# Patient Record
Sex: Female | Born: 1950 | Race: White | Hispanic: No | Marital: Married | Smoking: Former smoker
Health system: Southern US, Community
[De-identification: ages and names within clinical notes are randomized; demographics above are authoritative.]

## PROBLEM LIST (undated history)

## (undated) DIAGNOSIS — H43812 Vitreous degeneration, left eye: Secondary | ICD-10-CM

## (undated) DIAGNOSIS — M1990 Unspecified osteoarthritis, unspecified site: Secondary | ICD-10-CM

## (undated) DIAGNOSIS — K219 Gastro-esophageal reflux disease without esophagitis: Secondary | ICD-10-CM

## (undated) DIAGNOSIS — F419 Anxiety disorder, unspecified: Secondary | ICD-10-CM

## (undated) DIAGNOSIS — J302 Other seasonal allergic rhinitis: Secondary | ICD-10-CM

## (undated) DIAGNOSIS — T753XXA Motion sickness, initial encounter: Secondary | ICD-10-CM

## (undated) DIAGNOSIS — Z9889 Other specified postprocedural states: Secondary | ICD-10-CM

## (undated) DIAGNOSIS — F329 Major depressive disorder, single episode, unspecified: Secondary | ICD-10-CM

## (undated) DIAGNOSIS — I10 Essential (primary) hypertension: Secondary | ICD-10-CM

## (undated) DIAGNOSIS — H269 Unspecified cataract: Secondary | ICD-10-CM

## (undated) DIAGNOSIS — R569 Unspecified convulsions: Secondary | ICD-10-CM

## (undated) DIAGNOSIS — R011 Cardiac murmur, unspecified: Secondary | ICD-10-CM

## (undated) DIAGNOSIS — E785 Hyperlipidemia, unspecified: Secondary | ICD-10-CM

## (undated) DIAGNOSIS — L719 Rosacea, unspecified: Secondary | ICD-10-CM

## (undated) DIAGNOSIS — F338 Other recurrent depressive disorders: Secondary | ICD-10-CM

## (undated) DIAGNOSIS — G43109 Migraine with aura, not intractable, without status migrainosus: Secondary | ICD-10-CM

## (undated) DIAGNOSIS — F32A Depression, unspecified: Secondary | ICD-10-CM

## (undated) DIAGNOSIS — N281 Cyst of kidney, acquired: Secondary | ICD-10-CM

## (undated) DIAGNOSIS — L570 Actinic keratosis: Secondary | ICD-10-CM

## (undated) DIAGNOSIS — R112 Nausea with vomiting, unspecified: Secondary | ICD-10-CM

## (undated) DIAGNOSIS — Z8489 Family history of other specified conditions: Secondary | ICD-10-CM

## (undated) DIAGNOSIS — K760 Fatty (change of) liver, not elsewhere classified: Secondary | ICD-10-CM

## (undated) HISTORY — PX: EYE SURGERY: SHX253

## (undated) HISTORY — PX: COSMETIC SURGERY: SHX468

## (undated) HISTORY — PX: MELANOMA EXCISION: SHX5266

## (undated) HISTORY — DX: Vitreous degeneration, left eye: H43.812

## (undated) HISTORY — DX: Depression, unspecified: F32.A

## (undated) HISTORY — DX: Migraine with aura, not intractable, without status migrainosus: G43.109

## (undated) HISTORY — DX: Rosacea, unspecified: L71.9

## (undated) HISTORY — DX: Anxiety disorder, unspecified: F41.9

## (undated) HISTORY — DX: Hyperlipidemia, unspecified: E78.5

## (undated) HISTORY — DX: Essential (primary) hypertension: I10

## (undated) HISTORY — DX: Major depressive disorder, single episode, unspecified: F32.9

## (undated) HISTORY — PX: OTHER SURGICAL HISTORY: SHX169

## (undated) HISTORY — DX: Unspecified cataract: H26.9

## (undated) HISTORY — PX: BASAL CELL CARCINOMA EXCISION: SHX1214

## (undated) HISTORY — DX: Actinic keratosis: L57.0

---

## 2011-04-13 DIAGNOSIS — H43812 Vitreous degeneration, left eye: Secondary | ICD-10-CM

## 2011-04-13 HISTORY — DX: Vitreous degeneration, left eye: H43.812

## 2013-10-13 HISTORY — PX: CHOLECYSTECTOMY: SHX55

## 2013-10-24 ENCOUNTER — Ambulatory Visit: Payer: Self-pay | Admitting: Family Medicine

## 2013-11-01 ENCOUNTER — Ambulatory Visit: Payer: Self-pay | Admitting: Surgery

## 2013-11-03 ENCOUNTER — Ambulatory Visit: Payer: Self-pay | Admitting: Surgery

## 2013-11-06 LAB — PATHOLOGY REPORT

## 2015-02-03 NOTE — Op Note (Signed)
PATIENT NAME:  TATYM, SCHERMER MR#:  [id number] DATE OF BIRTH:  23-Jul-1951  DATE OF PROCEDURE:  11/03/2013  PREOPERATIVE DIAGNOSIS: Chronic cholecystitis, cholelithiasis.   POSTOPERATIVE DIAGNOSIS: Chronic cholecystitis, cholelithiasis.   PROCEDURE: Laparoscopic cholecystectomy.   SURGEON: Rochel Brome, MD  ANESTHESIA: General.   INDICATIONS: This 64 year old female had recent epigastric pain, ultrasound findings of gallstones and surgery was recommended for definitive treatment. She did also incidentally have fatty liver and nodularity. MRI showed no evidence of cancer.  DESCRIPTION OF PROCEDURE: The patient was placed on the operating table in the supine position under general endotracheal anesthesia. The abdomen was prepared with ChloraPrep and draped in a sterile manner.  A short incision was made in the inferior aspect of the umbilicus and carried down to the deep fascia which was grasped with laryngeal hook and elevated. A Veress needle was inserted, aspirated and irrigated with a saline solution. Next, the peritoneal cavity was inflated with carbon dioxide. The Veress needle was removed. The 10 mm cannula was inserted. The 10 mm, 0 degree laparoscope was inserted to view the peritoneal cavity. It did have the typical appearance of a fatty liver, but the liver surface was smooth. Another incision was made in the epigastrium slightly to the right of the midline to introduce an 11 mm cannula. Two incisions were made in the lateral aspect of the right upper quadrant to introduce two 5 mm cannulas.   There was an adhesion between the omentum and the falciform ligament which was divided with scissors. The gallbladder was retracted towards the right shoulder. It appeared to be moderately inflamed, moderate thickness of the wall. A number of adhesions were taken down with blunt and sharp dissection. The pouch of Randol Kern was retracted inferiorly and laterally. The porta hepatis was demonstrated.  The cystic artery was dissected free from surrounding structures. The cystic duct was dissected free from surrounding structures. The neck of the gallbladder was mobilized with incision of the visceral peritoneum. A critical view of safety was demonstrated. The cystic artery was controlled with double endoclips and divided to allow better traction on the cystic duct. Next, an Endo Clip was placed across the cystic duct adjacent to the neck of the gallbladder. An incision was made in the cystic duct. The Reddick catheter was inserted, but would not thread. I did milk 2 stones out of the cystic duct and still the Reddick catheter would not thread, therefore, cholangiogram was not done. The cystic duct was doubly ligated with endoclips and divided. The gallbladder was dissected free from the liver with hook and cautery. There was another branch of the cystic artery along the posterior margin of the gallbladder, which did bleed and was controlled with endoclips and cauterized and bleeding resolved. The site was irrigated with heparinized saline solution and aspirated. Hemostasis was subsequently intact. Next, the rest of the gallbladder was dissected away from the liver. The gallbladder was pulled up through the infraumbilical incision, opened and suctioned. It did have a palpable nodule in the distal fundus of the gallbladder consistent with phrygian cap. It was submitted in formalin for routine pathology. The right upper quadrant was further inspected. Hemostasis was intact. The cannulas were removed. Carbon dioxide was allowed to escape from the peritoneal cavity. Skin incisions were closed with interrupted 5-0 chromic subcuticular sutures, benzoin, and Steri-Strips. Dressings were applied with paper tape. The patient tolerated surgery satisfactorily and was then prepared for transfer to the recovery room. ____________________________ Lenna Sciara. Rochel Brome, MD jws:sb D: 11/03/2013 13:46:35  ET T: 11/03/2013 14:43:17  ET JOB#: [id number]  cc: Loreli Dollar, MD, <Dictator> Loreli Dollar MD ELECTRONICALLY SIGNED 11/08/2013 9:00

## 2015-03-30 DIAGNOSIS — F419 Anxiety disorder, unspecified: Secondary | ICD-10-CM | POA: Insufficient documentation

## 2015-03-30 DIAGNOSIS — F32A Depression, unspecified: Secondary | ICD-10-CM | POA: Insufficient documentation

## 2015-03-30 DIAGNOSIS — Z8679 Personal history of other diseases of the circulatory system: Secondary | ICD-10-CM | POA: Insufficient documentation

## 2015-03-30 DIAGNOSIS — L719 Rosacea, unspecified: Secondary | ICD-10-CM | POA: Insufficient documentation

## 2015-03-30 DIAGNOSIS — F329 Major depressive disorder, single episode, unspecified: Secondary | ICD-10-CM | POA: Insufficient documentation

## 2015-03-30 DIAGNOSIS — E785 Hyperlipidemia, unspecified: Secondary | ICD-10-CM | POA: Insufficient documentation

## 2015-03-30 DIAGNOSIS — I10 Essential (primary) hypertension: Secondary | ICD-10-CM | POA: Insufficient documentation

## 2015-04-05 ENCOUNTER — Encounter: Payer: Self-pay | Admitting: Family Medicine

## 2015-04-05 ENCOUNTER — Ambulatory Visit (INDEPENDENT_AMBULATORY_CARE_PROVIDER_SITE_OTHER): Payer: Medicare Other | Admitting: Family Medicine

## 2015-04-05 VITALS — BP 156/98 | HR 76 | Temp 98.6°F | Ht 65.0 in | Wt 182.2 lb

## 2015-04-05 DIAGNOSIS — J069 Acute upper respiratory infection, unspecified: Secondary | ICD-10-CM | POA: Diagnosis not present

## 2015-04-05 DIAGNOSIS — R932 Abnormal findings on diagnostic imaging of liver and biliary tract: Secondary | ICD-10-CM

## 2015-04-05 DIAGNOSIS — K921 Melena: Secondary | ICD-10-CM | POA: Diagnosis not present

## 2015-04-05 DIAGNOSIS — K760 Fatty (change of) liver, not elsewhere classified: Secondary | ICD-10-CM

## 2015-04-05 DIAGNOSIS — R195 Other fecal abnormalities: Secondary | ICD-10-CM | POA: Diagnosis not present

## 2015-04-05 DIAGNOSIS — R140 Abdominal distension (gaseous): Secondary | ICD-10-CM

## 2015-04-05 NOTE — Patient Instructions (Signed)
We'll get you in to see a gastroenterologist for evaluation of the abdominal concerns We'll get the MRI of your liver If you have not heard anything from my staff in a week about any orders/referrals/studies from today, please contact us here to follow-up [phone] Your goal blood pressure is less than 150 mmHg on top. Try to follow the DASH guidelines (DASH stands for Dietary Approaches to Stop Hypertension) Try to limit the sodium in your diet.  Ideally, consume less than 1.5 grams (less than 1,500mg ) per day. Do not add salt when cooking or at the table.  Check the sodium amount on labels when shopping, and choose items lower in sodium when given a choice. Avoid or limit foods that already contain a lot of sodium. Eat a diet rich in fruits and vegetables and whole grains. Return for a Welcome to Medicare visit (60 minutes please)

## 2015-04-05 NOTE — Progress Notes (Signed)
Patient: Mary Galvan, Female    DOB: 1951-03-19, 64 y.o.   MRN: [id number]  Visit Date: 04/05/2015  Today's Provider: Enid Derry, MD   Chief Complaint  Patient presents with  . Annual Exam    Medicare     Subjective:   Mary Galvan is a 64 y.o. female who presents today for her Welcome to Medicare visit However, she was not booked appropriately so we converted today's visit to a problem based E/M visit because she is having abdominal problems  She has flare ups of stomach upset, some pain in the left side, loose stool but not to the point of diarrhea; stools stink and this has happened before [place] in always on the left side; once she saw Malachy Mood and it was really painful to touch She had some bleeding from hemorrhoids, stomach upset; she was seen here and scheduled to see GI for colonoscopy but she cancelled that appt   She has been putting off taking care of dentist and other things because of work, but retiring July 8th so she is ready to get scans, have colonoscopy, take care of herself now  She saw a Psychologist, sport and exercise last year and had her gallbladder taken out; she had an US done and then an MRI done; Dr. Thompson Caul note and MRI report reviewed together with patient; she does not recall having the 6 month f/u MRI; she believes the first MRI was done with contrast  HPI  Review of Systems  Constitutional: Negative for fever (may have had with cold, virus, see below) and unexpected weight change (gained 20 pounds but back to old eating habits).  HENT: Positive for postnasal drip, rhinorrhea and sore throat (started almost 3 weeks ago, exposed to grandson who had tonsil and adenoids removed recently; he came down with this cold too; she does not think she had strep).   Respiratory: Positive for cough (two weeks ago, getting over virus).   Gastrointestinal: Positive for nausea (just a touch of nasuea with cramping in the lower abdomen), abdominal pain (LUQ, episodic), blood in stool (blood  is mixed in with the stool now, none this week but it happened last week), abdominal distention (bloats at times, varies with different foods, like [place] food) and anal bleeding (just once several months ago, but not recently). Negative for diarrhea (just loose, falls apart) and constipation.  Genitourinary: Positive for pelvic pain (menstrual type cramping and some bloating and nausea; thinks this is intestines, not ovaries; this is NOT new she says, always had this). Negative for vaginal bleeding.  Skin: Negative for rash (a few spots from outside working, gone now; a few scaly places, cherry angiomas on the chest).  Hematological: Negative for adenopathy (no big swollen glands). Does not bruise/bleed easily.    Past Medical History  Diagnosis Date  . Hyperlipidemia   . Hypertension   . Rosacea   . Depression   . Anxiety    Past Surgical History  Procedure Laterality Date  . Cholecystectomy  2015   Family History  Problem Relation Age of Onset  . Cancer Mother     possible lung?  . Dementia Mother   . Cancer Sister     kidney  . Diabetes Sister   . Cancer Paternal Grandfather     stomach and prostate  . Diabetes Sister   . Heart disease Sister    History   Social History  . Marital Status: Married    Spouse Name: N/A  . Number of Children:  N/A  . Years of Education: N/A   Occupational History  . Not on file.   Social History Main Topics  . Smoking status: Former Smoker    Quit date: 03/14/1999  . Smokeless tobacco: Never Used  . Alcohol Use: 0.0 oz/week    0 Standard drinks or equivalent per week     Comment: occasional  . Drug Use: No  . Sexual Activity:    Partners: Male   Other Topics Concern  . Not on file   Social History Narrative   Outpatient Encounter Prescriptions as of 04/05/2015  Medication Sig  . omeprazole (PRILOSEC) 20 MG capsule Take 20 mg by mouth daily.   No facility-administered encounter medications on file as of 04/05/2015.    Objective:   Vitals: BP 156/98 mmHg  Pulse 76  Temp(Src) 98.6 F (37 C) (Oral)  Ht 5\' 5"  (1.651 m)  Wt 182 lb 3.2 oz (82.645 kg)  BMI 30.32 kg/m2  SpO2 95% Body mass index is 30.32 kg/(m^2). No exam data present  Physical Exam  Constitutional: She appears well-developed and well-nourished. No distress.  HENT:  Head: Normocephalic and atraumatic.  Right Ear: No drainage or swelling. Tympanic membrane is not injected, not scarred, not perforated, not erythematous, not retracted and not bulging. No middle ear effusion. No hemotympanum.  Left Ear: No drainage or swelling. Tympanic membrane is not injected, not scarred, not perforated, not erythematous, not retracted and not bulging.  No middle ear effusion. No hemotympanum.  Nose: No mucosal edema, rhinorrhea or nasal deformity. No epistaxis.  Mouth/Throat: Mucous membranes are not pale and not dry. No dental abscesses or dental caries. No oropharyngeal exudate, posterior oropharyngeal edema or posterior oropharyngeal erythema (minimal injection).  Large clump of dark cerumen removed from right canal in one piece  Eyes: EOM are normal. No scleral icterus.  Neck: No thyromegaly present.  Cardiovascular: Normal rate, regular rhythm and normal heart sounds.   Pulmonary/Chest: Effort normal and breath sounds normal. No respiratory distress. She has no wheezes.  Abdominal: Soft. Bowel sounds are normal. She exhibits no distension and no mass. There is no tenderness. There is no guarding.  Musculoskeletal: Normal range of motion. She exhibits no edema.  Lymphadenopathy:    She has no cervical adenopathy.  Neurological: She is alert. She exhibits normal muscle tone.  Skin: Skin is warm and dry. No rash noted. She is not diaphoretic. No pallor.  Scattered cherry angiomas  Psychiatric: She has a normal mood and affect. Her behavior is normal. Judgment and thought content normal.    Assessment & Plan:   Problem List Items Addressed This  Visit    None    Visit Diagnoses    Abnormal magnetic resonance imaging of liver    -  Primary    reviewed previous MRI done by surgeon; she never had f/u scan so I am ordering that today; will check labs today; she says hepatitis labs done by surgeon    Relevant Orders    MR Liver W Wo Contrast    CBC with Differential/Platelet    Comprehensive metabolic panel    Gamma GT    Blood in the stool        refer to gastroenterologist    Relevant Orders    Ambulatory referral to Gastroenterology    Mucous in stools        refer to gastroenterologist    Relevant Orders    Ambulatory referral to Gastroenterology    Fatty infiltration of liver  Relevant Orders    Lipid Panel w/o Chol/HDL Ratio    Upper respiratory infection        resolving; she does not think she had strep    Abdominal bloating        patient says she had this long before the [place] last year, going on for years; she does not think ovaries; thinks GI related; she did not want to pursue pelvic US      An After Visit Summary was printed and given to the patient. Return in about 4 weeks (around 05/03/2015) for Welcome to Medicare 60 minute visit.

## 2015-04-06 ENCOUNTER — Encounter: Payer: Self-pay | Admitting: Family Medicine

## 2015-04-06 LAB — COMPREHENSIVE METABOLIC PANEL
ALT: 35 IU/L — ABNORMAL HIGH (ref 0–32)
AST: 24 IU/L (ref 0–40)
Albumin/Globulin Ratio: 1.4 (ref 1.1–2.5)
Albumin: 4.2 g/dL (ref 3.6–4.8)
Alkaline Phosphatase: 78 IU/L (ref 39–117)
BUN/Creatinine Ratio: 27 — ABNORMAL HIGH (ref 11–26)
BUN: 17 mg/dL (ref 8–27)
Bilirubin Total: 0.4 mg/dL (ref 0.0–1.2)
CO2: 24 mmol/L (ref 18–29)
Calcium: 9.6 mg/dL (ref 8.7–10.3)
Chloride: 98 mmol/L (ref 97–108)
Creatinine, Ser: 0.64 mg/dL (ref 0.57–1.00)
GFR calc Af Amer: 108 mL/min/{1.73_m2} (ref >59)
GFR calc non Af Amer: 94 mL/min/{1.73_m2} (ref >59)
Globulin, Total: 3 g/dL (ref 1.5–4.5)
Glucose: 90 mg/dL (ref 65–99)
POTASSIUM: 4.8 mmol/L (ref 3.5–5.2)
Sodium: 139 mmol/L (ref 134–144)
TOTAL PROTEIN: 7.2 g/dL (ref 6.0–8.5)

## 2015-04-06 LAB — LIPID PANEL W/O CHOL/HDL RATIO
CHOLESTEROL TOTAL: 258 mg/dL — AB (ref 100–199)
HDL: 60 mg/dL (ref >39)
LDL CALC: 158 mg/dL — AB (ref 0–99)
Triglycerides: 199 mg/dL — ABNORMAL HIGH (ref 0–149)
VLDL Cholesterol Cal: 40 mg/dL (ref 5–40)

## 2015-04-06 LAB — CBC WITH DIFFERENTIAL/PLATELET
BASOS: 0 %
Basophils Absolute: 0 10*3/uL (ref 0.0–0.2)
EOS (ABSOLUTE): 0.1 10*3/uL (ref 0.0–0.4)
Eos: 1 %
HEMATOCRIT: 46.5 % (ref 34.0–46.6)
HEMOGLOBIN: 15.6 g/dL (ref 11.1–15.9)
Immature Grans (Abs): 0 10*3/uL (ref 0.0–0.1)
Immature Granulocytes: 0 %
Lymphocytes Absolute: 3.8 10*3/uL — ABNORMAL HIGH (ref 0.7–3.1)
Lymphs: 52 %
MCH: 31.6 pg (ref 26.6–33.0)
MCHC: 33.5 g/dL (ref 31.5–35.7)
MCV: 94 fL (ref 79–97)
MONOCYTES: 6 %
Monocytes Absolute: 0.5 10*3/uL (ref 0.1–0.9)
NEUTROS ABS: 3.1 10*3/uL (ref 1.4–7.0)
Neutrophils: 41 %
Platelets: 239 10*3/uL (ref 150–379)
RBC: 4.94 x10E6/uL (ref 3.77–5.28)
RDW: 13.1 % (ref 12.3–15.4)
WBC: 7.5 10*3/uL (ref 3.4–10.8)

## 2015-04-06 LAB — GAMMA GT: GGT: 27 IU/L (ref 0–60)

## 2015-04-13 ENCOUNTER — Encounter: Payer: Self-pay | Admitting: Internal Medicine

## 2015-04-20 ENCOUNTER — Telehealth: Payer: Self-pay | Admitting: Family Medicine

## 2015-04-20 ENCOUNTER — Ambulatory Visit
Admission: RE | Admit: 2015-04-20 | Discharge: 2015-04-20 | Disposition: A | Discharge status: Home / Self Care | Payer: Medicare Other | Source: Ambulatory Visit | Attending: Family Medicine | Admitting: Family Medicine

## 2015-04-20 ENCOUNTER — Other Ambulatory Visit: Payer: Self-pay | Admitting: Family Medicine

## 2015-04-20 DIAGNOSIS — I700 Atherosclerosis of aorta: Secondary | ICD-10-CM

## 2015-04-20 DIAGNOSIS — R932 Abnormal findings on diagnostic imaging of liver and biliary tract: Secondary | ICD-10-CM

## 2015-04-20 DIAGNOSIS — K760 Fatty (change of) liver, not elsewhere classified: Secondary | ICD-10-CM

## 2015-04-20 DIAGNOSIS — F419 Anxiety disorder, unspecified: Secondary | ICD-10-CM

## 2015-04-20 DIAGNOSIS — K7689 Other specified diseases of liver: Secondary | ICD-10-CM | POA: Insufficient documentation

## 2015-04-20 DIAGNOSIS — Z09 Encounter for follow-up examination after completed treatment for conditions other than malignant neoplasm: Secondary | ICD-10-CM | POA: Diagnosis present

## 2015-04-20 DIAGNOSIS — E785 Hyperlipidemia, unspecified: Secondary | ICD-10-CM

## 2015-04-20 DIAGNOSIS — N281 Cyst of kidney, acquired: Secondary | ICD-10-CM | POA: Diagnosis not present

## 2015-04-20 MED ORDER — LORAZEPAM 0.5 MG PO TABS
ORAL_TABLET | ORAL | Status: DC
Start: 2015-04-20 — End: 2015-04-21

## 2015-04-20 MED ORDER — GADOBENATE DIMEGLUMINE 529 MG/ML IV SOLN
20.0000 mL | Freq: Once | INTRAVENOUS | Status: AC | PRN
  Administered 2015-04-20: 17 mL via INTRAVENOUS

## 2015-04-20 NOTE — Telephone Encounter (Signed)
[facility] on the phone pt went last night to get medication for anxiety because she is getting an MRI today. Pharmacy wants to know if Dr. Sanda Klein will be getting an Rx for it? Thanks.

## 2015-04-20 NOTE — Telephone Encounter (Signed)
Routing to provider  

## 2015-04-20 NOTE — Telephone Encounter (Signed)
Home number was Joe, did not leave more info other than my name I called cell number; she was driving, husband answered; stable, I'll call back when she's not driving; don't lose a minute of sleep, overall good news

## 2015-04-20 NOTE — Telephone Encounter (Signed)
I called in medicine this morning I was out of the office yesterday

## 2015-04-21 DIAGNOSIS — K760 Fatty (change of) liver, not elsewhere classified: Secondary | ICD-10-CM | POA: Insufficient documentation

## 2015-04-21 DIAGNOSIS — I700 Atherosclerosis of aorta: Secondary | ICD-10-CM | POA: Insufficient documentation

## 2015-04-21 NOTE — Telephone Encounter (Signed)
I talked with patient about her scan Fatty liver; she'll work on [employer]; limiting egg yolks, saturated fats, etc.; recheck lipids in 12 weeks after TLC Benign process in liver She'll see about getting EGD and colonoscopy, will talk with GI; she might try to find someone who can move up her scope instead of waiting until August She drinks occasional social alcohol, a beer with pizza, e.g.; less than 7 drinks per week; alcoholism in her family so she's careful

## 2015-04-21 NOTE — Assessment & Plan Note (Signed)
She will try TLC and recheck lipids in 12 weeks

## 2015-04-24 ENCOUNTER — Ambulatory Visit: Payer: Medicare Other | Admitting: Gastroenterology

## 2015-04-24 ENCOUNTER — Encounter: Payer: Self-pay | Admitting: Gastroenterology

## 2015-04-24 ENCOUNTER — Other Ambulatory Visit: Payer: Self-pay

## 2015-04-24 ENCOUNTER — Ambulatory Visit: Payer: Self-pay

## 2015-04-24 ENCOUNTER — Ambulatory Visit (INDEPENDENT_AMBULATORY_CARE_PROVIDER_SITE_OTHER): Payer: Medicare Other | Admitting: Gastroenterology

## 2015-04-24 VITALS — BP 146/84 | HR 72 | Temp 98.3°F | Ht 65.0 in | Wt 186.0 lb

## 2015-04-24 DIAGNOSIS — K921 Melena: Secondary | ICD-10-CM | POA: Diagnosis not present

## 2015-04-24 NOTE — Progress Notes (Signed)
Gastroenterology Consultation  Referring Provider:     Arnetha Courser, MD Primary Care Physician:  Enid Derry, MD Primary Gastroenterologist:  Dr. Allen Norris     Reason for Consultation:     Hematochezia        HPI:   Mary Galvan is a 64 y.o. y/o female referred for consultation & management of hematochezia by Dr. Enid Derry, MD.  This patient comes in today with a report of rectal bleeding back in November. She states she had not had any further rectal bleeding since then until recently when she had blood mixed with her stool and some mucus. The patient states that this is new for her. She also had an MRI of the abdomen after having a cyst found in her liver back when she had her gallbladder taken out at 2015. The patient reports that she had not had a follow-up MRI but her most recent MRI showed her to have a stable kidney and liver cyst with some fatty liver. The patient denies any abdominal discomfort with rectal bleeding but she has had abdominal pain in the left upper quadrant in the past that has improved with diet and Prilosec. She is hesitant to start Prilosec again because of recent articles talk about the risks of taking long-term PPIs. The patient reports that she has gained 25 pounds after losing the same amount of weight in the past. She has never had a colonoscopy in the past.  Past Medical History  Diagnosis Date  . Hyperlipidemia   . Hypertension   . Rosacea   . Depression   . Anxiety     Past Surgical History  Procedure Laterality Date  . Cholecystectomy  2015    Prior to Admission medications   Medication Sig Start Date End Date Taking? Authorizing Provider  calcium-vitamin D 250-100 MG-UNIT per tablet Take 1 tablet by mouth 2 (two) times daily.   Yes Historical Provider, MD  omeprazole (PRILOSEC) 20 MG capsule Take 20 mg by mouth daily.    Historical Provider, MD    Family History  Problem Relation Age of Onset  . Cancer Mother     possible lung?  .  Dementia Mother   . Cancer Sister     kidney  . Diabetes Sister   . Cancer Paternal Grandfather     stomach and prostate  . Diabetes Sister   . Heart disease Sister      History  Substance Use Topics  . Smoking status: Former Smoker    Quit date: 03/14/1999  . Smokeless tobacco: Never Used  . Alcohol Use: 0.0 oz/week    0 Standard drinks or equivalent per week     Comment: occasional    Allergies as of 04/24/2015  . (No Known Allergies)    Review of Systems:    All systems reviewed and negative except where noted in HPI.   Physical Exam:  BP 146/84 mmHg  Pulse 72  Temp(Src) 98.3 F (36.8 C) (Oral)  Ht 5\' 5"  (1.651 m)  Wt 186 lb (84.369 kg)  BMI 30.95 kg/m2 No LMP recorded. Patient is postmenopausal. Psych:  Alert and cooperative. Normal mood and affect. General:   Alert,  Well-developed, well-nourished, pleasant and cooperative in NAD Head:  Normocephalic and atraumatic. Eyes:  Sclera clear, no icterus.   Conjunctiva pink. Ears:  Normal auditory acuity. Nose:  No deformity, discharge, or lesions. Mouth:  No deformity or lesions,oropharynx pink & moist. Neck:  Supple; no masses or thyromegaly.  Lungs:  Respirations even and unlabored.  Clear throughout to auscultation.   No wheezes, crackles, or rhonchi. No acute distress. Heart:  Regular rate and rhythm; no murmurs, clicks, rubs, or gallops. Abdomen:  Normal bowel sounds.  No bruits.  Soft, non-tender and non-distended without masses, hepatosplenomegaly or hernias noted.  No guarding or rebound tenderness.  Negative Carnett sign.   Rectal:  Deferred.  Msk:  Symmetrical without gross deformities.  Good, equal movement & strength bilaterally. Pulses:  Normal pulses noted. Extremities:  No clubbing or edema.  No cyanosis. Neurologic:  Alert and oriented x3;  grossly normal neurologically. Skin:  Intact without significant lesions or rashes.  No jaundice. Lymph Nodes:  No significant cervical adenopathy. Psych:  Alert  and cooperative. Normal mood and affect.  Imaging Studies: Mr Liver W Wo Contrast  05/16/2015   CLINICAL DATA:  Evaluate liver nodules. History of rectal bleeding and bloody stool for 1 month.  EXAM: MRI ABDOMEN WITHOUT AND WITH CONTRAST  TECHNIQUE: Multiplanar multisequence MR imaging of the abdomen was performed both before and after the administration of intravenous contrast.  CONTRAST:  81mL MULTIHANCE GADOBENATE DIMEGLUMINE 529 MG/ML IV SOLN  COMPARISON:  11/01/2013  FINDINGS: Lower chest:  No pleural or pericardial effusion.  Hepatobiliary: Marked diffuse hepatic steatosis. Similar appearance of centrally located cyst adjacent to porta hepatis measuring 8 mm, image 15 of series 4. Stable 8 mm enhancing abnormality within segment 6 of the liver, image 50/series 9. This is compatible with a benign process. No suspicious liver abnormalities noted. Previous cholecystectomy. There is no biliary dilatation.  Pancreas: Normal appearance of the pancreas.  Spleen: The spleen is unremarkable.  Adrenals/Urinary Tract: Normal appearance of the adrenal glands. Right renal cyst is identified measuring 2.5 cm. No suspicious kidney abnormalities identified.  Stomach/Bowel: The stomach and visualized upper abdominal bowel loops are unremarkable.  Vascular/Lymphatic: The abdominal aorta has a normal course and caliber. Mild atherosclerosis noted. No adenopathy noted within the upper abdomen.  Other: No free fluid or fluid collections.  Musculoskeletal: Normal signal from within the bone marrow.  IMPRESSION: 1. No acute findings within the upper abdomen. 2. Stable liver and kidney cyst. 3. Hepatic steatosis. 4. No change in enhancing segment 6 sub cm abnormality which is compatible with a benign process.   Electronically Signed   By: Kerby Moors M.D.   On: 05/16/15 11:38    Assessment and Plan:   Mary Galvan is a 64 y.o. y/o female comes in today with a history of rectal bleeding and looser bowel movements  with episodes of explosive diarrhea. The patient states she usually has 1 loose bowel movement a day and has noticed blood in her stools. The patient's first rectal bleeding was back in November. The patient also had an MRI that showed her to have fatty liver and stable kidney and liver cysts. The patient will be set up for a colonoscopy due to her rectal bleeding. The patient will try to avoid milk products and see if that helps with her explosive loose bowel movements. She states that she drinks a lot of milk and has a lot of dairy products in her diet. The patient will also tell me at the time of the colonoscopy if she is having continued abdominal pain necessitating adding a upper endoscopy at time of the colonoscopy. I have discussed risks & benefits which include, but are not limited to, bleeding, infection, perforation & drug reaction.  The patient agrees with this plan & written  consent will be obtained.

## 2015-05-01 ENCOUNTER — Encounter: Payer: Self-pay | Admitting: *Deleted

## 2015-05-03 ENCOUNTER — Encounter: Payer: Self-pay | Admitting: Family Medicine

## 2015-05-03 ENCOUNTER — Ambulatory Visit (INDEPENDENT_AMBULATORY_CARE_PROVIDER_SITE_OTHER): Payer: Medicare Other | Admitting: Family Medicine

## 2015-05-03 VITALS — BP 137/88 | HR 70 | Temp 97.3°F | Ht 65.0 in | Wt 185.0 lb

## 2015-05-03 DIAGNOSIS — F329 Major depressive disorder, single episode, unspecified: Secondary | ICD-10-CM

## 2015-05-03 DIAGNOSIS — K760 Fatty (change of) liver, not elsewhere classified: Secondary | ICD-10-CM | POA: Diagnosis not present

## 2015-05-03 DIAGNOSIS — Z0000 Encounter for general adult medical examination without abnormal findings: Secondary | ICD-10-CM | POA: Insufficient documentation

## 2015-05-03 DIAGNOSIS — K219 Gastro-esophageal reflux disease without esophagitis: Secondary | ICD-10-CM

## 2015-05-03 DIAGNOSIS — I872 Venous insufficiency (chronic) (peripheral): Secondary | ICD-10-CM

## 2015-05-03 DIAGNOSIS — M8580 Other specified disorders of bone density and structure, unspecified site: Secondary | ICD-10-CM | POA: Diagnosis not present

## 2015-05-03 DIAGNOSIS — R350 Frequency of micturition: Secondary | ICD-10-CM

## 2015-05-03 DIAGNOSIS — E785 Hyperlipidemia, unspecified: Secondary | ICD-10-CM

## 2015-05-03 DIAGNOSIS — E669 Obesity, unspecified: Secondary | ICD-10-CM | POA: Diagnosis not present

## 2015-05-03 DIAGNOSIS — I10 Essential (primary) hypertension: Secondary | ICD-10-CM | POA: Diagnosis not present

## 2015-05-03 DIAGNOSIS — Z87891 Personal history of nicotine dependence: Secondary | ICD-10-CM | POA: Diagnosis not present

## 2015-05-03 DIAGNOSIS — Z23 Encounter for immunization: Secondary | ICD-10-CM | POA: Diagnosis not present

## 2015-05-03 DIAGNOSIS — Z1159 Encounter for screening for other viral diseases: Secondary | ICD-10-CM | POA: Diagnosis not present

## 2015-05-03 DIAGNOSIS — I700 Atherosclerosis of aorta: Secondary | ICD-10-CM | POA: Diagnosis not present

## 2015-05-03 DIAGNOSIS — E559 Vitamin D deficiency, unspecified: Secondary | ICD-10-CM | POA: Diagnosis not present

## 2015-05-03 DIAGNOSIS — J309 Allergic rhinitis, unspecified: Secondary | ICD-10-CM | POA: Insufficient documentation

## 2015-05-03 DIAGNOSIS — R1012 Left upper quadrant pain: Secondary | ICD-10-CM | POA: Insufficient documentation

## 2015-05-03 DIAGNOSIS — F32A Depression, unspecified: Secondary | ICD-10-CM

## 2015-05-03 DIAGNOSIS — J301 Allergic rhinitis due to pollen: Secondary | ICD-10-CM

## 2015-05-03 MED ORDER — RANITIDINE HCL 300 MG PO TABS: 300.0000 mg | ORAL_TABLET | Freq: Every day | ORAL | Status: DC

## 2015-05-03 MED ORDER — MONTELUKAST SODIUM 10 MG PO TABS: 10.0000 mg | ORAL_TABLET | Freq: Every day | ORAL | Status: DC

## 2015-05-03 MED ORDER — PNEUMOCOCCAL 13-VAL CONJ VACC IM SUSP
0.5000 mL | Freq: Once | INTRAMUSCULAR | Status: AC
  Administered 2015-05-03: 0.5 mL via INTRAMUSCULAR

## 2015-05-03 MED ORDER — PNEUMOCOCCAL 13-VAL CONJ VACC IM SUSP: 0.5000 mL | INTRAMUSCULAR | Status: DC

## 2015-05-03 NOTE — Progress Notes (Signed)
fPatient: Mary Galvan, Female    DOB: 1950-12-18, 64 y.o.   MRN: [id number]  Visit Date: 05/05/2015  Today's Provider: Enid Derry, MD   Chief Complaint  Patient presents with  . Annual Exam    Medicare Wellness Exam. Interested in Shingles Vaccine.    Subjective:   Mary Galvan is a 64 y.o. female who presents today for her Subsequent Annual Wellness Visit.  Caregiver input:  n/a  HPI  Going to have colonoscopy on July 28th, Dr. Allen Norris in [facility] Recurrent sores on the skin, going to see dermatologist Still having some congestion; occasional allergy; sister had COPD (smoker), another living has COPD (smoker); asthma in the family  Review of Systems  Cardiovascular: Positive for leg swelling (leg swelling worse at end of day, fluid retention; tight at the end of the day).  Gastrointestinal: Positive for abdominal pain (discussed with Dr. Allen Norris, goes for colonoscopy, will consider having EGD, but Dr. Allen Norris didn't recommend it; chronic for years; goes and comes, used to take Prilosec).  Genitourinary: Positive for urgency (for quite some time).       Urine leaking  Psychiatric/Behavioral: Negative for sleep disturbance and dysphoric mood. The patient is not nervous/anxious.   hx of seasonal affective disorder, low vit D, but doing well now  Past Medical History  Diagnosis Date  . Rosacea   . PONV (postoperative nausea and vomiting)     slow to wake  . Seizures     x1 - after head trauma. 1970's  . Heart murmur   . Arthritis     thumbs  . Fatty liver disease, nonalcoholic   . Motion sickness     reading in cars  . GERD (gastroesophageal reflux disease)   . Kidney cysts   . Anxiety   . Hyperlipidemia   . Hypertension   . Depression   . Seasonal affective disorder   . Vitreous detachment of left eye July 2012    [facility]   Past Surgical History  Procedure Laterality Date  . Cholecystectomy  2015   Family History  Problem Relation Age of Onset  .  Cancer Mother     possible lung?  . Dementia Mother   . Cancer Sister     kidney  . Diabetes Sister   . COPD Sister   . Cancer Paternal Grandfather     stomach and prostate  . Diabetes Sister   . Heart disease Sister   . Hypertension Sister   . COPD Sister   . Stroke Maternal Uncle    History   Social History  . Marital Status: Married    Spouse Name: N/A  . Number of Children: N/A  . Years of Education: N/A   Occupational History  . Not on file.   Social History Main Topics  . Smoking status: Former Smoker -- 1.00 packs/day for 30 years    Types: Cigarettes    Quit date: 03/14/1999  . Smokeless tobacco: Never Used  . Alcohol Use: 1.2 oz/week    2 Cans of beer, 0 Standard drinks or equivalent per week     Comment: occasional  . Drug Use: No  . Sexual Activity:    Partners: Male   Other Topics Concern  . Not on file   Social History Narrative   Outpatient Encounter Prescriptions as of 05/03/2015  Medication Sig  . calcium-vitamin D 250-100 MG-UNIT per tablet Take 1 tablet by mouth 2 (two) times daily.  . Cholecalciferol (VITAMIN D3)  2000 UNITS TABS Take by mouth.  . montelukast (SINGULAIR) 10 MG tablet Take 1 tablet (10 mg total) by mouth at bedtime.  Marland Kitchen omeprazole (PRILOSEC) 20 MG capsule Take 20 mg by mouth daily.  . ranitidine (ZANTAC) 300 MG tablet Take 1 tablet (300 mg total) by mouth at bedtime.  . [EXPIRED] pneumococcal 13-valent conjugate vaccine (PREVNAR 13) injection 0.5 mL   . [DISCONTINUED] pneumococcal 13-valent conjugate vaccine (PREVNAR 13) injection 0.5 mL    No facility-administered encounter medications on file as of 05/03/2015.   Functional Ability / Safety Screening 1.  Was the timed Get Up and Go test longer than 30 seconds?  no 2.  Does the patient need help with the phone, transportation, shopping,      preparing meals, housework, laundry, medications, or managing money?  no 3.  Does the patient's home have:  loose throw rugs in the  hallway?   no, has nonstick pads on the backs of Oriental      Grab bars in the bathroom? no      Handrails on the stairs?   yes      Poor lighting?   no 4.  Has the patient noticed any hearing difficulties?   no  Fall Risk Assessment See under rooming  Depression Screen See under rooming Depression screen [facility] 2/9 05/03/2015  Decreased Interest 0  Down, Depressed, Hopeless 0  PHQ - 2 Score 0   Advanced Directives Does patient have a HCPOA?    no If yes, name and contact information:  Does patient have a living will or MOST form?  no  Objective:   Vitals: BP 137/88 mmHg  Pulse 70  Temp(Src) 97.3 F (36.3 C)  Ht 5\' 5"  (1.651 m)  Wt 185 lb (83.915 kg)  BMI 30.79 kg/m2  SpO2 96% Body mass index is 30.79 kg/(m^2).  Visual Acuity Screening   Right eye Left eye Both eyes  Without correction:     With correction: 20/20 20/20    Physical Exam  Constitutional: She appears well-developed and well-nourished. No distress.  HENT:  Head: Normocephalic and atraumatic.  Right Ear: Hearing, tympanic membrane, external ear and ear canal normal. Tympanic membrane is not injected, not erythematous and not retracted.  Left Ear: Hearing, tympanic membrane, external ear and ear canal normal. Tympanic membrane is not injected, not erythematous and not retracted.  Nose: Rhinorrhea present.  Mouth/Throat: Mucous membranes are not dry.  Eyes: EOM are normal. No scleral icterus.  Neck: No thyromegaly present.  Cardiovascular: Normal rate, regular rhythm and normal heart sounds.   No murmur heard. Pulmonary/Chest: Effort normal and breath sounds normal. No respiratory distress. She has no wheezes. Right breast exhibits no inverted nipple, no mass, no nipple discharge, no skin change and no tenderness. Left breast exhibits no inverted nipple, no mass, no nipple discharge, no skin change and no tenderness. Breasts are symmetrical.  Abdominal: Soft. Bowel sounds are normal. She exhibits no  distension.  Genitourinary:  Deferred to next year  Musculoskeletal: Normal range of motion. She exhibits edema (trace nonpitting ankle edema; varicose veins and spider veins both legs).  Lymphadenopathy:    She has no cervical adenopathy.  Neurological: She is alert. She displays no atrophy and no tremor. She exhibits normal muscle tone. Gait normal.  Skin: Skin is warm and dry. No ecchymosis and no rash noted. She is not diaphoretic. No cyanosis. No pallor.  Psychiatric: She has a normal mood and affect. Her speech is normal and behavior is  normal. Judgment and thought content normal. Cognition and memory are normal.   Mood/affect:  euthymic Appearance:  Casual, neat, good hygiene  Cognitive Testing - 6-CIT  Correct? Score   What year is it? yes 0 Yes = 0    No = 4  What month is it? yes 0 Yes = 0    No = 3  Remember:     [place], [place], [place]     What time is it? yes 0 Yes = 0    No = 3  Count backwards from 20 to 1 yes 0 Correct = 0    1 error = 2   More than 1 error = 4  Say the months of the year in reverse. yes 0 Correct = 0    1 error = 2   More than 1 error = 4  What address did I ask you to remember? yes 0 Correct = 0  1 error = 2    2 error = 4    3 error = 6    4 error = 8    All wrong = 10       TOTAL SCORE  0/28   Interpretation:  Normal  Normal (0-7) Abnormal (8-28)   Assessment & Plan:     Annual Wellness Visit  Reviewed patient's Family Medical History Reviewed and updated list of patient's medical providers Assessment of cognitive impairment was done Assessed patient's functional ability Established a written schedule for health screening [place] Completed and Reviewed  Exercise Activities and Dietary recommendations Goals    None    She has started walking in the morning, 1/2 mile each time, every other day Work up to 150 minutes a week  Immunization History  Administered Date(s) Administered  .  Influenza-Unspecified 08/18/2014  . Pneumococcal Conjugate-13 05/03/2015  . Tdap 05/16/2013    Health Maintenance  Topic Date Due  . HIV Screening  12/30/1964  . COLONOSCOPY  12/31/1999  . ZOSTAVAX  12/30/2009  . DEXA SCAN  12/31/2014  . INFLUENZA VACCINE  05/14/2015  . PNA vac Low Risk Adult (2 of 2 - PPSV23) 05/02/2016  . MAMMOGRAM  09/29/2016  . TETANUS/TDAP  05/17/2023     Discussed health benefits of physical activity, and encouraged her to engage in regular exercise appropriate for her age and condition.   Meds ordered this encounter  Medications  . montelukast (SINGULAIR) 10 MG tablet    Sig: Take 1 tablet (10 mg total) by mouth at bedtime.    Dispense:  30 tablet    Refill:  6  . DISCONTD: pneumococcal 13-valent conjugate vaccine (PREVNAR 13) injection 0.5 mL    Sig:   . pneumococcal 13-valent conjugate vaccine (PREVNAR 13) injection 0.5 mL    Sig:   . ranitidine (ZANTAC) 300 MG tablet    Sig: Take 1 tablet (300 mg total) by mouth at bedtime.    Dispense:  30 tablet    Refill:  5    Current outpatient prescriptions:  .  calcium-vitamin D 250-100 MG-UNIT per tablet, Take 1 tablet by mouth 2 (two) times daily., Disp: , Rfl:  .  Cholecalciferol (VITAMIN D3) 2000 UNITS TABS, Take by mouth., Disp: , Rfl:  .  montelukast (SINGULAIR) 10 MG tablet, Take 1 tablet (10 mg total) by mouth at bedtime., Disp: 30 tablet, Rfl: 6 .  omeprazole (PRILOSEC) 20 MG capsule, Take 20 mg by mouth daily., Disp: , Rfl:  .  ranitidine (ZANTAC) 300 MG tablet, Take 1 tablet (300 mg total) by mouth at bedtime., Disp: 30 tablet, Rfl: 5 Medications Discontinued During This Encounter  Medication Reason  . pneumococcal 13-valent conjugate vaccine (PREVNAR 13) injection 0.5 mL     Next Medicare Wellness Visit in 12+ months  Problem List Items Addressed This Visit      Cardiovascular and Mediastinum   Hypertension    Weight management, healthy eating, DASH guidelines      Abdominal aortic  atherosclerosis    She is going to have lipids rechecked in October; goal LDL under 70 ideally; weight loss encouraged      Chronic venous insufficiency    Recommend low salt diet, compression stockings; elevate feet above hips        Respiratory   Allergic rhinitis    Start singulair        Digestive   Fatty liver disease, nonalcoholic    Recheck liver enzymes in October        Musculoskeletal and Integument   Osteopenia    Hx of smoking, white woman; try to get 3 servings of calcium a day; vit d discussed; DEXA ordered      Relevant Orders   DG Bone Density     Other   Hyperlipidemia    Check fasting lipids in October; goal LDL under 70 ideally, at least under 100; modest weight loss and activity should help      Depression    Likely seasonal affective disorder; monitor vit D, consider light therapy in the winter months; can use SSRI if desired      Vitamin D deficiency    Order entered to have this done in October with other fasting labs, discussed routine 1,000 iu vit D3 daily, but she may need more if low; too much can be harmful      Relevant Orders   Vit D  25 hydroxy (rtn osteoporosis monitoring)   Preventative health care - Primary    Age-appropriate preventive care, counseling, recommendations; see after visit summary      Relevant Medications   pneumococcal 13-valent conjugate vaccine (PREVNAR 13) injection 0.5 mL (Completed)   Other Relevant Orders   EKG 12-Lead (Completed)   History of tobacco use    She had 30 pack year history, but quit more than 15 years ago; spirometry today was negative      Relevant Medications   pneumococcal 13-valent conjugate vaccine (PREVNAR 13) injection 0.5 mL (Completed)   Other Relevant Orders   PR BREATHING CAPACITY TEST (Completed)   Special screening examination for viral disease    Check HIV and hepatitis C given age and current recommendations; 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 [employer]; she thinks  she got vaccine but titer might not have been adequate      Relevant Medications   pneumococcal 13-valent conjugate vaccine (PREVNAR 13) injection 0.5 mL (Completed)   Other Relevant Orders   HIV antibody   Hepatitis C Antibody   Hepatitis B Surface AntiGEN   Hepatitis B Surface AntiBODY   Obesity    Work on modest weight loss; [email or web address] info recommended; BMI over 30; healthier eating and activity (build up gradually)       Other Visit Diagnoses    Urinary frequency        check urine today; avoid caffeine    Relevant Orders    UA/M w/rflx Culture, Routine (Completed)    Need for prophylactic vaccination against Streptococcus pneumoniae (pneumococcus)  PCV-13 given today; no more PCV-13 due per ACIP guidelines; next pneumonia vaccine will be next year for the PPSV-23      An After Visit Summary was printed and given to the patient.

## 2015-05-03 NOTE — Assessment & Plan Note (Signed)
Hx of smoking, white woman; try to get 3 servings of calcium a day; vit d discussed; DEXA ordered

## 2015-05-03 NOTE — Assessment & Plan Note (Signed)
She is going to have lipids rechecked in October; goal LDL under 70 ideally; weight loss encouraged

## 2015-05-03 NOTE — Assessment & Plan Note (Signed)
Recommend low salt diet, compression stockings; elevate feet above hips

## 2015-05-03 NOTE — Assessment & Plan Note (Signed)
Weight management, healthy eating, DASH guidelines

## 2015-05-03 NOTE — Assessment & Plan Note (Signed)
Check fasting lipids in October; goal LDL under 70 ideally, at least under 100; modest weight loss and activity should help

## 2015-05-03 NOTE — Patient Instructions (Addendum)
Do consider filling out the HCPOA and living will forms and sharing those with your loved ones Think about using light therapy during the winter months and shorter days We can start medicine in December each year if you desire, just let me know Do consider getting grab bars in the bathoom (wall mounts are the most stable) For most people, 1,000 iu of vitamin D3 daily is adequate Too much vitamin D3 can be harmful, so let's check that with your other fasting labs in October Do try to work up gradually to 150 minutes per week of activity, start slow and build up gradually Aspirin 81 mg coated once a day for stroke prevention Check out the information at familydoctor.org entitled "What It Takes to Lose Weight" Try to lose between 1-2 pounds per week by taking in fewer calories and burning off more calories You can succeed by limiting portions, limiting foods dense in calories and fat, becoming more active, and drinking 8 glasses of water a day Don't skip meals, especially breakfast, as skipping meals may alter your metabolism Do not use over-the-counter weight loss pills or gimmicks that claim rapid weight loss A healthy BMI (or body mass index) is between 18.5 and 24.9 You can calculate your ideal BMI at the [place] website [employer]fr Pap smear next year at your yearly visit Please have the bone density test done Have fasting labs done in October The mammogram date in the table is wrong and you can have that done now We are giving you today the PCV-13 (Prevnar) vaccine and that is good for life according to the ACIP Your next pneumonia vaccine will be next year, that will be a PPSV-23 (Pneumovax) The shingles vaccine can be done one month or more from now at a local participating pharmacy, just call us for the prescription; you can shed live virus for up to 8 weeks after you receive that vaccine so DON'T be around pregnant women who have not had  chickenpox, anyone receiving chemotherapy for cancer, and young babies under the age one Avoid caffeine and chocolate and tea completely and see if your bladder symptoms don't go completely away Return in one year for your next medicare wellness visit Consider horse chestnut for veins, elevate feet above hips when seated for a long time, and limit sodium   Health Maintenance  Topic Date Due  . HIV Screening  12/30/1964  . COLONOSCOPY  12/31/1999  . ZOSTAVAX  12/30/2009  . DEXA SCAN  12/31/2014  . PNA vac Low Risk Adult (1 of 2 - PCV13) 12/31/2014  . INFLUENZA VACCINE  05/14/2015  . MAMMOGRAM  09/29/2016  . TETANUS/TDAP  05/17/2023

## 2015-05-03 NOTE — Assessment & Plan Note (Signed)
Start singulair

## 2015-05-03 NOTE — Assessment & Plan Note (Addendum)
Work on modest weight loss; [email or web address] info recommended; BMI over 30; healthier eating and activity (build up gradually)

## 2015-05-03 NOTE — Assessment & Plan Note (Signed)
Recheck liver enzymes in October

## 2015-05-03 NOTE — Assessment & Plan Note (Signed)
She is seeing GI, not going to have EGD right now; will try H2 blocker and avoid triggers

## 2015-05-03 NOTE — Assessment & Plan Note (Signed)
Likely seasonal affective disorder; monitor vit D, consider light therapy in the winter months; can use SSRI if desired

## 2015-05-03 NOTE — Assessment & Plan Note (Signed)
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

## 2015-05-03 NOTE — Assessment & Plan Note (Addendum)
Order entered to have this done in October with other fasting labs, discussed routine 1,000 iu vit D3 daily, but she may need more if low; too much can be harmful

## 2015-05-03 NOTE — Assessment & Plan Note (Addendum)
Age-appropriate preventive care, counseling, recommendations; see after visit summary

## 2015-05-03 NOTE — Assessment & Plan Note (Signed)
She had 30 pack year history, but quit more than 15 years ago; spirometry today was negative

## 2015-05-04 LAB — UA/M W/RFLX CULTURE, ROUTINE
Bilirubin, UA: NEGATIVE
Glucose, UA: NEGATIVE
Ketones, UA: NEGATIVE
Leukocytes, UA: NEGATIVE
Nitrite, UA: NEGATIVE
Protein, UA: NEGATIVE
Specific Gravity, UA: 1.02 (ref 1.005–1.030)
UUROB: 0.2 mg/dL (ref 0.2–1.0)
pH, UA: 5.5 (ref 5.0–7.5)

## 2015-05-04 LAB — MICROSCOPIC EXAMINATION

## 2015-05-09 NOTE — Discharge Instructions (Signed)

## 2015-05-10 ENCOUNTER — Encounter
Admission: RE | Disposition: A | Discharge status: Home / Self Care | Payer: Self-pay | Source: Ambulatory Visit | Attending: Gastroenterology

## 2015-05-10 ENCOUNTER — Ambulatory Visit
Admission: RE | Admit: 2015-05-10 | Discharge: 2015-05-10 | Disposition: A | Discharge status: Home / Self Care | Payer: Medicare Other | Source: Ambulatory Visit | Attending: Gastroenterology | Admitting: Gastroenterology

## 2015-05-10 ENCOUNTER — Other Ambulatory Visit: Payer: Self-pay | Admitting: Gastroenterology

## 2015-05-10 ENCOUNTER — Ambulatory Visit: Payer: Medicare Other | Admitting: Anesthesiology

## 2015-05-10 DIAGNOSIS — Z9049 Acquired absence of other specified parts of digestive tract: Secondary | ICD-10-CM | POA: Insufficient documentation

## 2015-05-10 DIAGNOSIS — Z818 Family history of other mental and behavioral disorders: Secondary | ICD-10-CM | POA: Insufficient documentation

## 2015-05-10 DIAGNOSIS — E785 Hyperlipidemia, unspecified: Secondary | ICD-10-CM | POA: Insufficient documentation

## 2015-05-10 DIAGNOSIS — K219 Gastro-esophageal reflux disease without esophagitis: Secondary | ICD-10-CM | POA: Insufficient documentation

## 2015-05-10 DIAGNOSIS — Z833 Family history of diabetes mellitus: Secondary | ICD-10-CM | POA: Diagnosis not present

## 2015-05-10 DIAGNOSIS — K648 Other hemorrhoids: Secondary | ICD-10-CM | POA: Diagnosis not present

## 2015-05-10 DIAGNOSIS — Z79899 Other long term (current) drug therapy: Secondary | ICD-10-CM | POA: Insufficient documentation

## 2015-05-10 DIAGNOSIS — K921 Melena: Secondary | ICD-10-CM | POA: Diagnosis not present

## 2015-05-10 DIAGNOSIS — F419 Anxiety disorder, unspecified: Secondary | ICD-10-CM | POA: Insufficient documentation

## 2015-05-10 DIAGNOSIS — Z825 Family history of asthma and other chronic lower respiratory diseases: Secondary | ICD-10-CM | POA: Insufficient documentation

## 2015-05-10 DIAGNOSIS — Z87891 Personal history of nicotine dependence: Secondary | ICD-10-CM | POA: Diagnosis not present

## 2015-05-10 DIAGNOSIS — R011 Cardiac murmur, unspecified: Secondary | ICD-10-CM | POA: Diagnosis not present

## 2015-05-10 DIAGNOSIS — Z8249 Family history of ischemic heart disease and other diseases of the circulatory system: Secondary | ICD-10-CM | POA: Insufficient documentation

## 2015-05-10 DIAGNOSIS — L719 Rosacea, unspecified: Secondary | ICD-10-CM | POA: Diagnosis not present

## 2015-05-10 DIAGNOSIS — I10 Essential (primary) hypertension: Secondary | ICD-10-CM | POA: Insufficient documentation

## 2015-05-10 DIAGNOSIS — D125 Benign neoplasm of sigmoid colon: Secondary | ICD-10-CM | POA: Insufficient documentation

## 2015-05-10 DIAGNOSIS — Z8042 Family history of malignant neoplasm of prostate: Secondary | ICD-10-CM | POA: Insufficient documentation

## 2015-05-10 DIAGNOSIS — Z800 Family history of malignant neoplasm of digestive organs: Secondary | ICD-10-CM | POA: Insufficient documentation

## 2015-05-10 DIAGNOSIS — M19041 Primary osteoarthritis, right hand: Secondary | ICD-10-CM | POA: Insufficient documentation

## 2015-05-10 DIAGNOSIS — Z809 Family history of malignant neoplasm, unspecified: Secondary | ICD-10-CM | POA: Diagnosis not present

## 2015-05-10 DIAGNOSIS — F329 Major depressive disorder, single episode, unspecified: Secondary | ICD-10-CM | POA: Insufficient documentation

## 2015-05-10 DIAGNOSIS — F39 Unspecified mood [affective] disorder: Secondary | ICD-10-CM | POA: Insufficient documentation

## 2015-05-10 DIAGNOSIS — K625 Hemorrhage of anus and rectum: Secondary | ICD-10-CM | POA: Diagnosis not present

## 2015-05-10 DIAGNOSIS — Z823 Family history of stroke: Secondary | ICD-10-CM | POA: Insufficient documentation

## 2015-05-10 DIAGNOSIS — K640 First degree hemorrhoids: Secondary | ICD-10-CM | POA: Insufficient documentation

## 2015-05-10 DIAGNOSIS — K760 Fatty (change of) liver, not elsewhere classified: Secondary | ICD-10-CM | POA: Diagnosis not present

## 2015-05-10 DIAGNOSIS — D126 Benign neoplasm of colon, unspecified: Secondary | ICD-10-CM | POA: Diagnosis not present

## 2015-05-10 DIAGNOSIS — M19042 Primary osteoarthritis, left hand: Secondary | ICD-10-CM | POA: Insufficient documentation

## 2015-05-10 DIAGNOSIS — D124 Benign neoplasm of descending colon: Secondary | ICD-10-CM | POA: Insufficient documentation

## 2015-05-10 HISTORY — DX: Cyst of kidney, acquired: N28.1

## 2015-05-10 HISTORY — DX: Other specified postprocedural states: Z98.890

## 2015-05-10 HISTORY — DX: Fatty (change of) liver, not elsewhere classified: K76.0

## 2015-05-10 HISTORY — DX: Gastro-esophageal reflux disease without esophagitis: K21.9

## 2015-05-10 HISTORY — DX: Unspecified osteoarthritis, unspecified site: M19.90

## 2015-05-10 HISTORY — DX: Other specified postprocedural states: R11.2

## 2015-05-10 HISTORY — DX: Other recurrent depressive disorders: F33.8

## 2015-05-10 HISTORY — PX: COLONOSCOPY WITH PROPOFOL: SHX5780

## 2015-05-10 HISTORY — DX: Unspecified convulsions: R56.9

## 2015-05-10 HISTORY — DX: Cardiac murmur, unspecified: R01.1

## 2015-05-10 HISTORY — DX: Motion sickness, initial encounter: T75.3XXA

## 2015-05-10 SURGERY — COLONOSCOPY WITH PROPOFOL
Anesthesia: Monitor Anesthesia Care | Wound class: Contaminated

## 2015-05-10 MED ORDER — STERILE WATER FOR IRRIGATION IR SOLN
Status: DC | PRN
  Administered 2015-05-10: 10:00:00

## 2015-05-10 MED ORDER — LIDOCAINE HCL (CARDIAC) 20 MG/ML IV SOLN
INTRAVENOUS | Status: DC | PRN
  Administered 2015-05-10: 50 mg via INTRAVENOUS

## 2015-05-10 MED ORDER — LACTATED RINGERS IV SOLN
INTRAVENOUS | Status: DC
  Administered 2015-05-10: 09:00:00 via INTRAVENOUS

## 2015-05-10 MED ORDER — SODIUM CHLORIDE 0.9 % IV SOLN: INTRAVENOUS | Status: DC

## 2015-05-10 MED ORDER — PROPOFOL 10 MG/ML IV BOLUS
INTRAVENOUS | Status: DC | PRN
  Administered 2015-05-10: 30 mg via INTRAVENOUS
  Administered 2015-05-10 (×4): 20 mg via INTRAVENOUS
  Administered 2015-05-10: 30 mg via INTRAVENOUS
  Administered 2015-05-10: 40 mg via INTRAVENOUS
  Administered 2015-05-10: 50 mg via INTRAVENOUS
  Administered 2015-05-10: 20 mg via INTRAVENOUS

## 2015-05-10 SURGICAL SUPPLY — 28 items

## 2015-05-10 NOTE — Transfer of Care (Signed)
Immediate Anesthesia Transfer of Care Note  Patient: Mary Galvan  Procedure(s) Performed: Procedure(s) with comments: COLONOSCOPY WITH PROPOFOL (N/A) - WITH BIOPSY-- SIGMOID COLON POLYP  X  4 DESCENDING COLON POLYP  Patient Location: PACU  Anesthesia Type: MAC  Level of Consciousness: awake, alert  and patient cooperative  Airway and Oxygen Therapy: Patient Spontanous Breathing and Patient connected to supplemental oxygen  Post-op Assessment: Post-op Vital signs reviewed, Patient's Cardiovascular Status Stable, Respiratory Function Stable, Patent Airway and No signs of Nausea or vomiting  Post-op Vital Signs: Reviewed and stable  Complications: No apparent anesthesia complications

## 2015-05-10 NOTE — Anesthesia Procedure Notes (Signed)
Procedure Name: MAC Performed by: Keandra Medero Pre-anesthesia Checklist: Patient identified, Emergency Drugs available, Suction available, Timeout performed and Patient being monitored Patient Re-evaluated:Patient Re-evaluated prior to inductionOxygen Delivery Method: Nasal cannula Placement Confirmation: positive ETCO2       

## 2015-05-10 NOTE — Anesthesia Postprocedure Evaluation (Signed)
  Anesthesia Post-op Note  Patient: Mary Galvan  Procedure(s) Performed: Procedure(s) with comments: COLONOSCOPY WITH PROPOFOL (N/A) - WITH BIOPSY-- SIGMOID COLON POLYP  X  4 DESCENDING COLON POLYP  Anesthesia type:MAC  Patient location: PACU  Post pain: Pain level controlled  Post assessment: Post-op Vital signs reviewed, Patient's Cardiovascular Status Stable, Respiratory Function Stable, Patent Airway and No signs of Nausea or vomiting  Post vital signs: Reviewed and stable  Last Vitals:  Filed Vitals:   05/10/15 1049  BP:   Pulse: 63  Temp:   Resp: 18    Level of consciousness: awake, alert  and patient cooperative  Complications: No apparent anesthesia complications

## 2015-05-10 NOTE — H&P (Signed)
[facility]  [address] [place], [place] [address] Phone: [phone] Fax : [phone]  Primary Care Physician:  Enid Derry, MD Primary Gastroenterologist:  Dr. Allen Norris  Pre-Procedure History & Physical: HPI:  Mary Galvan is a 64 y.o. female is here for an colonoscopy.   Past Medical History  Diagnosis Date  . Rosacea   . PONV (postoperative nausea and vomiting)     slow to wake  . Seizures     x1 - after head trauma. 1970's  . Heart murmur   . Arthritis     thumbs  . Fatty liver disease, nonalcoholic   . Motion sickness     reading in cars  . GERD (gastroesophageal reflux disease)   . Kidney cysts   . Anxiety   . Hyperlipidemia   . Hypertension   . Depression   . Seasonal affective disorder   . Vitreous detachment of left eye July 2012    [facility]    Past Surgical History  Procedure Laterality Date  . Cholecystectomy  2015    Prior to Admission medications   Medication Sig Start Date End Date Taking? Authorizing Provider  calcium-vitamin D 250-100 MG-UNIT per tablet Take 1 tablet by mouth 2 (two) times daily.   Yes Historical Provider, MD  Cholecalciferol (VITAMIN D3) 2000 UNITS TABS Take by mouth.   Yes Historical Provider, MD  montelukast (SINGULAIR) 10 MG tablet Take 1 tablet (10 mg total) by mouth at bedtime. 05/03/15  Yes Arnetha Courser, MD  omeprazole (PRILOSEC) 20 MG capsule Take 20 mg by mouth daily.   Yes Historical Provider, MD  ranitidine (ZANTAC) 300 MG tablet Take 1 tablet (300 mg total) by mouth at bedtime. 05/03/15  Yes Arnetha Courser, MD    Allergies as of 04/24/2015  . (No Known Allergies)    Family History  Problem Relation Age of Onset  . Cancer Mother     possible lung?  . Dementia Mother   . Cancer Sister     kidney  . Diabetes Sister   . COPD Sister   . Cancer Paternal Grandfather     stomach and prostate  . Diabetes Sister   . Heart disease Sister   . Hypertension Sister   . COPD  Sister   . Stroke Maternal Uncle     History   Social History  . Marital Status: Married    Spouse Name: N/A  . Number of Children: N/A  . Years of Education: N/A   Occupational History  . Not on file.   Social History Main Topics  . Smoking status: Former Smoker -- 1.00 packs/day for 30 years    Types: Cigarettes    Quit date: 03/14/1999  . Smokeless tobacco: Never Used  . Alcohol Use: 1.2 oz/week    2 Cans of beer, 0 Standard drinks or equivalent per week     Comment: occasional  . Drug Use: No  . Sexual Activity:    Partners: Male   Other Topics Concern  . Not on file   Social History Narrative    Review of Systems: See HPI, otherwise negative ROS  Physical Exam: BP 155/92 mmHg  Pulse 72  Temp(Src) 97.7 F (36.5 C) (Temporal)  Resp 16  Ht 5\' 5"  (1.651 m)  Wt 181 lb (82.101 kg)  BMI 30.12 kg/m2  SpO2 96% General:   Alert,  pleasant and cooperative in NAD Head:  Normocephalic and atraumatic. Neck:  Supple; no masses  or thyromegaly. Lungs:  Clear throughout to auscultation.    Heart:  Regular rate and rhythm. Abdomen:  Soft, nontender and nondistended. Normal bowel sounds, without guarding, and without rebound.   Neurologic:  Alert and  oriented x4;  grossly normal neurologically.  Impression/Plan: Mary Galvan is here for an colonoscopy to be performed for rectal bleeding and change in bowel habits.  Risks, benefits, limitations, and alternatives regarding  colonoscopy have been reviewed with the patient.  Questions have been answered.  All parties agreeable.   Ollen Bowl, MD  05/10/2015, 10:05 AM

## 2015-05-10 NOTE — Anesthesia Preprocedure Evaluation (Signed)
Anesthesia Evaluation  Patient identified by MRN, date of birth, ID band Patient awake    Reviewed: Allergy & Precautions, H&P , NPO status , Patient's Chart, lab work & pertinent test results, reviewed documented beta blocker date and time   History of Anesthesia Complications (+) PONV, PROLONGED EMERGENCE and history of anesthetic complications  Airway Mallampati: II  TM Distance: >3 FB Neck ROM: full    Dental no notable dental hx.    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, none since),  PSYCHIATRIC DISORDERS (depression, anxiety)    GI/Hepatic GERD-  ,(+) Cirrhosis - (NAFLD)      ,   Endo/Other  negative endocrine ROS  Renal/GU negative Renal ROS  negative genitourinary   Musculoskeletal   Abdominal   Peds  Hematology negative hematology ROS (+)   Anesthesia Other Findings   Reproductive/Obstetrics negative OB ROS                             Anesthesia Physical Anesthesia Plan  ASA: II  Anesthesia Plan: MAC   Post-op Pain Management:    Induction:   Airway Management Planned:   Additional Equipment:   Intra-op Plan:   Post-operative Plan:   Informed Consent: I have reviewed the patients History and Physical, chart, labs and discussed the procedure including the risks, benefits and alternatives for the proposed anesthesia with the patient or authorized representative who has indicated his/her understanding and acceptance.   Dental Advisory Given  Plan Discussed with: CRNA  Anesthesia Plan Comments:         Anesthesia Quick Evaluation

## 2015-05-10 NOTE — Op Note (Signed)
[facility] Patient Name: Mary Galvan Procedure Date: 05/10/2015 10:00 AM MRN: [id number] Account #: [id number] Date of Birth: 04/14/1951 Admit Type: Outpatient Age: 64 Room: [facility] OR ROOM 01 Gender: Female Note Status: Finalized Procedure:         Colonoscopy Indications:       Hematochezia Providers:         Lucilla Lame, MD Referring MD:      Arnetha Courser (Referring MD) Medicines:         Monitored Anesthesia Care Complications:     No immediate complications. Procedure:         Pre-Anesthesia Assessment:                    - Prior to the procedure, a History and Physical was                     performed, and patient medications and allergies were                     reviewed. The patient's tolerance of previous anesthesia                     was also reviewed. The risks and benefits of the procedure                     and the sedation options and risks were discussed with the                     patient. All questions were answered, and informed consent                     was obtained. Prior Anticoagulants: The patient has taken                     no previous anticoagulant or antiplatelet agents. ASA                     Grade Assessment: II - A patient with mild systemic                     disease. After reviewing the risks and benefits, the                     patient was deemed in satisfactory condition to undergo                     the procedure.                    After obtaining informed consent, the colonoscope was                     passed under direct vision. Throughout the procedure, the                     patient's blood pressure, pulse, and oxygen saturations                     were monitored continuously. The Olympus CF-HQ190L                     Colonoscope (S#. [id number]) was introduced through the anus                     and  advanced to the the cecum, identified by appendiceal                     orifice and  ileocecal valve. The colonoscopy was performed                     without difficulty. The patient tolerated the procedure                     well. The quality of the bowel preparation was excellent. Findings:      The perianal and digital rectal examinations were normal.      Four sessile polyps were found in the sigmoid colon. The polyps were 6       to 9 mm in size. These polyps were removed with a hot snare. Resection       and retrieval were complete.      A 5 mm polyp was found in the descending colon. The polyp was sessile.       The polyp was removed with a cold snare. Resection and retrieval were       complete.      Non-bleeding internal hemorrhoids were found during retroflexion. The       hemorrhoids were Grade I (internal hemorrhoids that do not prolapse). Impression:        - Four 6 to 9 mm polyps in the sigmoid colon. Resected and                     retrieved.                    - One 5 mm polyp in the descending colon. Resected and                     retrieved.                    - Non-bleeding internal hemorrhoids. Recommendation:    - Repeat colonoscopy in 5 years if polyp adenoma and 10                     years if hyperplastic Procedure Code(s): --- Professional ---                    [phone], Colonoscopy, flexible; with removal of tumor(s),                     polyp(s), or other lesion(s) by snare technique Diagnosis Code(s): --- Professional ---                    K92.1, Melena                    D12.5, Benign neoplasm of sigmoid colon                    D12.4, Benign neoplasm of descending colon CPT copyright 2014 American Medical Association. All rights reserved. The codes documented in this report are preliminary and upon coder review may  be revised to meet current compliance requirements. Lucilla Lame, MD 05/10/2015 10:36:27 AM This report has been signed electronically. Number of Addenda: 0 Note Initiated On: 05/10/2015 10:00 AM Scope Withdrawal Time: 0  hours 9 minutes 50 seconds  Total Procedure Duration: 0 hours 17 minutes 59 seconds       [facility]

## 2015-05-11 ENCOUNTER — Encounter: Payer: Self-pay | Admitting: Gastroenterology

## 2015-05-15 ENCOUNTER — Encounter: Payer: Self-pay | Admitting: Gastroenterology

## 2015-05-23 ENCOUNTER — Encounter: Payer: Self-pay | Admitting: Internal Medicine

## 2015-06-07 DIAGNOSIS — L570 Actinic keratosis: Secondary | ICD-10-CM | POA: Diagnosis not present

## 2015-06-07 DIAGNOSIS — B078 Other viral warts: Secondary | ICD-10-CM | POA: Diagnosis not present

## 2015-06-07 DIAGNOSIS — D2239 Melanocytic nevi of other parts of face: Secondary | ICD-10-CM | POA: Diagnosis not present

## 2015-06-07 DIAGNOSIS — D0462 Carcinoma in situ of skin of left upper limb, including shoulder: Secondary | ICD-10-CM | POA: Diagnosis not present

## 2015-06-07 DIAGNOSIS — D485 Neoplasm of uncertain behavior of skin: Secondary | ICD-10-CM | POA: Diagnosis not present

## 2015-06-07 DIAGNOSIS — Z789 Other specified health status: Secondary | ICD-10-CM | POA: Diagnosis not present

## 2015-06-22 ENCOUNTER — Telehealth: Payer: Self-pay | Admitting: Gastroenterology

## 2015-06-22 NOTE — Telephone Encounter (Signed)
Needs results from colonoscopy

## 2015-06-29 DIAGNOSIS — C44629 Squamous cell carcinoma of skin of left upper limb, including shoulder: Secondary | ICD-10-CM | POA: Diagnosis not present

## 2015-06-29 DIAGNOSIS — L0889 Other specified local infections of the skin and subcutaneous tissue: Secondary | ICD-10-CM | POA: Diagnosis not present

## 2015-06-29 DIAGNOSIS — D0462 Carcinoma in situ of skin of left upper limb, including shoulder: Secondary | ICD-10-CM | POA: Diagnosis not present

## 2015-06-29 DIAGNOSIS — B078 Other viral warts: Secondary | ICD-10-CM | POA: Diagnosis not present

## 2015-06-29 DIAGNOSIS — Z789 Other specified health status: Secondary | ICD-10-CM | POA: Diagnosis not present

## 2015-06-29 DIAGNOSIS — L570 Actinic keratosis: Secondary | ICD-10-CM | POA: Diagnosis not present

## 2015-08-06 ENCOUNTER — Telehealth: Payer: Self-pay

## 2015-08-06 NOTE — Telephone Encounter (Signed)
-----   Message from Arnetha Courser, MD sent at 08/03/2015  4:39 PM EDT ----- Regarding: Overdue DEXA   ----- Message -----    From: SYSTEM    Sent: 05/04/2015  12:04 AM      To: Arnetha Courser, MD

## 2015-08-06 NOTE — Telephone Encounter (Signed)
Patient notified, she will call and schedule appt.

## 2015-11-20 ENCOUNTER — Encounter: Payer: Self-pay | Admitting: Family Medicine

## 2016-01-08 DIAGNOSIS — H2513 Age-related nuclear cataract, bilateral: Secondary | ICD-10-CM | POA: Diagnosis not present

## 2016-01-09 ENCOUNTER — Encounter: Payer: Self-pay | Admitting: Family Medicine

## 2016-01-09 NOTE — Progress Notes (Unsigned)
DEXA scan was ordered last July; will cancel; now more than 6 months old; I am leaving this practice; new provider may order I will also cancel the Hep C test that she never had drawn

## 2016-01-10 DIAGNOSIS — N840 Polyp of corpus uteri: Secondary | ICD-10-CM | POA: Diagnosis not present

## 2016-01-10 DIAGNOSIS — N950 Postmenopausal bleeding: Secondary | ICD-10-CM | POA: Diagnosis not present

## 2016-01-10 DIAGNOSIS — N762 Acute vulvitis: Secondary | ICD-10-CM | POA: Diagnosis not present

## 2016-01-10 DIAGNOSIS — N841 Polyp of cervix uteri: Secondary | ICD-10-CM | POA: Diagnosis not present

## 2016-01-14 ENCOUNTER — Encounter: Payer: Self-pay | Admitting: Family Medicine

## 2016-01-14 ENCOUNTER — Ambulatory Visit (INDEPENDENT_AMBULATORY_CARE_PROVIDER_SITE_OTHER): Payer: Medicare Other | Admitting: Family Medicine

## 2016-01-14 VITALS — BP 169/92 | HR 91 | Temp 98.2°F | Ht 64.7 in | Wt 188.0 lb

## 2016-01-14 DIAGNOSIS — R609 Edema, unspecified: Secondary | ICD-10-CM | POA: Diagnosis not present

## 2016-01-14 DIAGNOSIS — I10 Essential (primary) hypertension: Secondary | ICD-10-CM | POA: Diagnosis not present

## 2016-01-14 LAB — MICROALBUMIN, URINE WAIVED
CREATININE, URINE WAIVED: 100 mg/dL (ref 10–300)
MICROALB, UR WAIVED: 30 mg/L — AB (ref 0–19)

## 2016-01-14 MED ORDER — HYDROCHLOROTHIAZIDE 25 MG PO TABS: 25.0000 mg | ORAL_TABLET | Freq: Every day | ORAL | Status: DC

## 2016-01-14 NOTE — Progress Notes (Signed)
BP 169/92 mmHg  Pulse 91  Temp(Src) 98.2 F (36.8 C)  Ht 5' 4.7" (1.643 m)  Wt 188 lb (85.276 kg)  BMI 31.59 kg/m2  SpO2 96%   Subjective:    Patient ID: Mary Galvan, female    DOB: 09-27-1951, 65 y.o.   MRN: [id number]  HPI: Mary Galvan is a 65 y.o. female  Chief Complaint  Patient presents with  . Hypertension    Patient states that she has been having elevated BP, and some eye issues.    Sister was hospitalized and passed away a few weeks ago. Had been under a lot of stress. Her legs were swelling a lot. The day of her funeral. Was having flashing lights in her eyes and started bleeding vaginally. Went to see her GYN and her BP was elevated when she went there. She had a polyp removed from her cervix. Had a endometrial biopsy and a saline ultrasound for work up on her vaginal bleeding. Saw her opthalmologist on Tuesday and everything was nice and normal. Still having the flashing lights in the R eye. Was sitting and standing a long time when she was at the hospital with her sister. Took some old fluid lasix that she had at home and that seemed to help. Had that for fluid retention in the past back in 2004.   HYPERTENSION- has not been on medicine, has been controlling it with diet and exercise. Has been under a lot of stress recently. Hypertension status: exacerbated  Satisfied with current treatment? no Duration of hypertension: BP has gone up and down over the past several years with weight gain BP monitoring frequency:  daily BP range: 130s-170s/80s Aspirin: no Recurrent headaches: yes Visual changes: yes Palpitations: yes Dyspnea: yes Chest pain: no Lower extremity edema: yes Dizzy/lightheaded: no  Relevant past medical, surgical, family and social history reviewed and updated as indicated. Interim medical history since our last visit reviewed. Allergies and medications reviewed and updated.  Review of Systems  Constitutional: Negative.   Respiratory:  Negative.   Cardiovascular: Positive for palpitations and leg swelling. Negative for chest pain.  Psychiatric/Behavioral: Negative for suicidal ideas, hallucinations, behavioral problems, confusion, sleep disturbance, self-injury, dysphoric mood, decreased concentration and agitation. The patient is nervous/anxious. The patient is not hyperactive.     Per HPI unless specifically indicated above     Objective:    BP 169/92 mmHg  Pulse 91  Temp(Src) 98.2 F (36.8 C)  Ht 5' 4.7" (1.643 m)  Wt 188 lb (85.276 kg)  BMI 31.59 kg/m2  SpO2 96%  Wt Readings from Last 3 Encounters:  01/14/16 188 lb (85.276 kg)  05/10/15 181 lb (82.101 kg)  05/03/15 185 lb (83.915 kg)    Physical Exam  Constitutional: She is oriented to person, place, and time. She appears well-developed and well-nourished. No distress.  HENT:  Head: Normocephalic and atraumatic.  Right Ear: Hearing normal.  Left Ear: Hearing normal.  Nose: Nose normal.  Eyes: Conjunctivae and lids are normal. Right eye exhibits no discharge. Left eye exhibits no discharge. No scleral icterus.  Cardiovascular: Normal rate, regular rhythm and intact distal pulses.  Exam reveals no gallop and no friction rub.   Murmur heard. Pulmonary/Chest: Effort normal and breath sounds normal. No respiratory distress. She has no wheezes. She has no rales. She exhibits no tenderness.  Musculoskeletal: Normal range of motion. She exhibits edema (trace bilaterally).  Neurological: She is alert and oriented to person, place, and time.  Skin: Skin is  warm, dry and intact. No rash noted. No erythema. No pallor.  Psychiatric: She has a normal mood and affect. Her speech is normal and behavior is normal. Judgment and thought content normal. Cognition and memory are normal.  Nursing note and vitals reviewed.   Results for orders placed or performed in visit on 05/03/15  Microscopic Examination  Result Value Ref Range   WBC, UA 0-5 0 -  5 /hpf   RBC, UA 0-2  0 -  2 /hpf   Epithelial Cells (non renal) 0-10 0 - 10 /hpf   Bacteria, UA Few None seen/Few  UA/M w/rflx Culture, Routine  Result Value Ref Range   Specific Gravity, UA 1.020 1.005 - 1.030   pH, UA 5.5 5.0 - 7.5   Color, UA Yellow Yellow   Appearance Ur Clear Clear   Leukocytes, UA Negative Negative   Protein, UA Negative Negative/Trace   Glucose, UA Negative Negative   Ketones, UA Negative Negative   RBC, UA Trace (A) Negative   Bilirubin, UA Negative Negative   Urobilinogen, Ur 0.2 0.2 - 1.0 mg/dL   Nitrite, UA Negative Negative   Microscopic Examination See below:       Assessment & Plan:   Problem List Items Addressed This Visit      Cardiovascular and Mediastinum   Hypertension - Primary    Checking labs today. Await results. Will start HCTZ. Recheck in 1 month. Call with concerns or problems.       Relevant Medications   hydrochlorothiazide (HYDRODIURIL) 25 MG tablet   Other Relevant Orders   CBC with Differential/Platelet   Comprehensive metabolic panel   Microalbumin, Urine Waived   TSH    Other Visit Diagnoses    Edema, unspecified type        Work on keeping legs up. Compression stockings. Checking labs, await results.     Relevant Orders    CBC with Differential/Platelet    Comprehensive metabolic panel    Microalbumin, Urine Waived    TSH        Follow up plan: Return in about 4 weeks (around 02/11/2016) for BP follow up.

## 2016-01-14 NOTE — Patient Instructions (Signed)
DASH Eating Plan  DASH stands for "Dietary Approaches to Stop Hypertension." The DASH eating plan is a healthy eating plan that has been shown to reduce high blood pressure (hypertension). Additional health benefits may include reducing the risk of type 2 diabetes mellitus, heart disease, and stroke. The DASH eating plan may also help with weight loss.  WHAT DO I NEED TO KNOW ABOUT THE DASH EATING PLAN?  For the DASH eating plan, you will follow these general guidelines:  · Choose foods with a percent daily value for sodium of less than 5% (as listed on the food label).  · Use salt-free seasonings or herbs instead of table salt or sea salt.  · Check with your health care provider or pharmacist before using salt substitutes.  · Eat lower-sodium products, often labeled as "lower sodium" or "no salt added."  · Eat fresh foods.  · Eat more vegetables, fruits, and low-fat dairy products.  · Choose whole grains. Look for the word "whole" as the first word in the ingredient list.  · Choose fish and skinless chicken or turkey more often than red meat. Limit fish, poultry, and meat to 6 oz (170 g) each day.  · Limit sweets, desserts, sugars, and sugary drinks.  · Choose heart-healthy fats.  · Limit cheese to 1 oz (28 g) per day.  · Eat more home-cooked food and less restaurant, buffet, and fast food.  · Limit fried foods.  · Cook foods using methods other than frying.  · Limit canned vegetables. If you do use them, rinse them well to decrease the sodium.  · When eating at a restaurant, ask that your food be prepared with less salt, or no salt if possible.  WHAT FOODS CAN I EAT?  Seek help from a dietitian for individual calorie needs.  Grains  Whole grain or whole wheat bread. Brown rice. Whole grain or whole wheat pasta. Quinoa, bulgur, and whole grain cereals. Low-sodium cereals. Corn or whole wheat flour tortillas. Whole grain cornbread. Whole grain crackers. Low-sodium crackers.  Vegetables  Fresh or frozen vegetables  (raw, steamed, roasted, or grilled). Low-sodium or reduced-sodium tomato and vegetable juices. Low-sodium or reduced-sodium tomato sauce and paste. Low-sodium or reduced-sodium canned vegetables.   Fruits  All fresh, canned (in natural juice), or frozen fruits.  Meat and Other Protein Products  Ground beef (85% or leaner), grass-fed beef, or beef trimmed of fat. Skinless chicken or turkey. Ground chicken or turkey. Pork trimmed of fat. All fish and seafood. Eggs. Dried beans, peas, or lentils. Unsalted nuts and seeds. Unsalted canned beans.  Dairy  Low-fat dairy products, such as skim or 1% milk, 2% or reduced-fat cheeses, low-fat ricotta or cottage cheese, or plain low-fat yogurt. Low-sodium or reduced-sodium cheeses.  Fats and Oils  Tub margarines without trans fats. Light or reduced-fat mayonnaise and salad dressings (reduced sodium). Avocado. Safflower, olive, or canola oils. Natural peanut or almond butter.  Other  Unsalted popcorn and pretzels.  The items listed above may not be a complete list of recommended foods or beverages. Contact your dietitian for more options.  WHAT FOODS ARE NOT RECOMMENDED?  Grains  White bread. White pasta. White rice. Refined cornbread. Bagels and croissants. Crackers that contain trans fat.  Vegetables  Creamed or fried vegetables. Vegetables in a cheese sauce. Regular canned vegetables. Regular canned tomato sauce and paste. Regular tomato and vegetable juices.  Fruits  Dried fruits. Canned fruit in light or heavy syrup. Fruit juice.  Meat and Other Protein   Products  Fatty cuts of meat. Ribs, chicken wings, bacon, sausage, bologna, salami, chitterlings, fatback, hot dogs, bratwurst, and packaged luncheon meats. Salted nuts and seeds. Canned beans with salt.  Dairy  Whole or 2% milk, cream, half-and-half, and cream cheese. Whole-fat or sweetened yogurt. Full-fat cheeses or blue cheese. Nondairy creamers and whipped toppings. Processed cheese, cheese spreads, or cheese  curds.  Condiments  Onion and garlic salt, seasoned salt, table salt, and sea salt. Canned and packaged gravies. Worcestershire sauce. Tartar sauce. Barbecue sauce. Teriyaki sauce. Soy sauce, including reduced sodium. Steak sauce. Fish sauce. Oyster sauce. Cocktail sauce. Horseradish. Ketchup and mustard. Meat flavorings and tenderizers. Bouillon cubes. Hot sauce. Tabasco sauce. Marinades. Taco seasonings. Relishes.  Fats and Oils  Butter, stick margarine, lard, shortening, ghee, and bacon fat. Coconut, palm kernel, or palm oils. Regular salad dressings.  Other  Pickles and olives. Salted popcorn and pretzels.  The items listed above may not be a complete list of foods and beverages to avoid. Contact your dietitian for more information.  WHERE CAN I FIND MORE INFORMATION?  National Heart, Lung, and Blood Institute: www.nhlbi.nih.gov/health/health-topics/topics/dash/     This information is not intended to replace advice given to you by your health care provider. Make sure you discuss any questions you have with your health care provider.     Document Released: 09/18/2011 Document Revised: 10/20/2014 Document Reviewed: 08/03/2013  Elsevier Interactive Patient Education ©2016 [employer]

## 2016-01-14 NOTE — Assessment & Plan Note (Signed)
Checking labs today. Await results. Will start HCTZ. Recheck in 1 month. Call with concerns or problems.

## 2016-01-15 ENCOUNTER — Encounter: Payer: Self-pay | Admitting: Family Medicine

## 2016-01-15 LAB — CBC WITH DIFFERENTIAL/PLATELET
BASOS: 0 %
Basophils Absolute: 0 10*3/uL (ref 0.0–0.2)
EOS (ABSOLUTE): 0.1 10*3/uL (ref 0.0–0.4)
EOS: 1 %
HEMATOCRIT: 46.6 % (ref 34.0–46.6)
HEMOGLOBIN: 15.8 g/dL (ref 11.1–15.9)
IMMATURE GRANULOCYTES: 0 %
Immature Grans (Abs): 0 10*3/uL (ref 0.0–0.1)
LYMPHS ABS: 3.7 10*3/uL — AB (ref 0.7–3.1)
Lymphs: 39 %
MCH: 31.5 pg (ref 26.6–33.0)
MCHC: 33.9 g/dL (ref 31.5–35.7)
MCV: 93 fL (ref 79–97)
Monocytes Absolute: 0.8 10*3/uL (ref 0.1–0.9)
Monocytes: 9 %
Neutrophils Absolute: 4.7 10*3/uL (ref 1.4–7.0)
Neutrophils: 51 %
Platelets: 251 10*3/uL (ref 150–379)
RBC: 5.01 x10E6/uL (ref 3.77–5.28)
RDW: 13.1 % (ref 12.3–15.4)
WBC: 9.5 10*3/uL (ref 3.4–10.8)

## 2016-01-15 LAB — COMPREHENSIVE METABOLIC PANEL
ALT: 38 IU/L — ABNORMAL HIGH (ref 0–32)
AST: 23 IU/L (ref 0–40)
Albumin/Globulin Ratio: 1.5 (ref 1.2–2.2)
Albumin: 4.1 g/dL (ref 3.6–4.8)
Alkaline Phosphatase: 81 IU/L (ref 39–117)
BUN/Creatinine Ratio: 31 — ABNORMAL HIGH (ref 12–28)
BUN: 18 mg/dL (ref 8–27)
Bilirubin Total: 0.2 mg/dL (ref 0.0–1.2)
CALCIUM: 9.7 mg/dL (ref 8.7–10.3)
CO2: 25 mmol/L (ref 18–29)
CREATININE: 0.58 mg/dL (ref 0.57–1.00)
Chloride: 99 mmol/L (ref 96–106)
GFR, EST AFRICAN AMERICAN: 111 mL/min/{1.73_m2} (ref >59)
GFR, EST NON AFRICAN AMERICAN: 96 mL/min/{1.73_m2} (ref >59)
GLOBULIN, TOTAL: 2.7 g/dL (ref 1.5–4.5)
Glucose: 88 mg/dL (ref 65–99)
POTASSIUM: 4.5 mmol/L (ref 3.5–5.2)
SODIUM: 140 mmol/L (ref 134–144)
Total Protein: 6.8 g/dL (ref 6.0–8.5)

## 2016-01-15 LAB — TSH: TSH: 2.08 u[IU]/mL (ref 0.450–4.500)

## 2016-01-18 ENCOUNTER — Ambulatory Visit: Payer: Medicare Other | Admitting: Family Medicine

## 2016-01-24 DIAGNOSIS — N950 Postmenopausal bleeding: Secondary | ICD-10-CM | POA: Diagnosis not present

## 2016-01-24 DIAGNOSIS — N840 Polyp of corpus uteri: Secondary | ICD-10-CM | POA: Diagnosis not present

## 2016-02-11 ENCOUNTER — Ambulatory Visit (INDEPENDENT_AMBULATORY_CARE_PROVIDER_SITE_OTHER): Payer: Medicare Other | Admitting: Family Medicine

## 2016-02-11 ENCOUNTER — Encounter: Payer: Self-pay | Admitting: Family Medicine

## 2016-02-11 VITALS — BP 140/86 | HR 82 | Temp 97.7°F | Wt 188.0 lb

## 2016-02-11 DIAGNOSIS — Z1231 Encounter for screening mammogram for malignant neoplasm of breast: Secondary | ICD-10-CM | POA: Diagnosis not present

## 2016-02-11 DIAGNOSIS — Z1159 Encounter for screening for other viral diseases: Secondary | ICD-10-CM

## 2016-02-11 DIAGNOSIS — I10 Essential (primary) hypertension: Secondary | ICD-10-CM

## 2016-02-11 DIAGNOSIS — M8580 Other specified disorders of bone density and structure, unspecified site: Secondary | ICD-10-CM | POA: Diagnosis not present

## 2016-02-11 MED ORDER — HYDROCHLOROTHIAZIDE 25 MG PO TABS: 25.0000 mg | ORAL_TABLET | Freq: Every day | ORAL | Status: DC

## 2016-02-11 NOTE — Progress Notes (Signed)
BP 140/86 mmHg  Pulse 82  Temp(Src) 97.7 F (36.5 C)  Wt 188 lb (85.276 kg)  SpO2 96%   Subjective:    Patient ID: Mary Galvan, female    DOB: 11/26/1950, 65 y.o.   MRN: [id number]  HPI: Mary Galvan is a 65 y.o. female  Chief Complaint  Patient presents with  . Hypertension  . Orders    order placed for mammogram and also possibly needs dexa   HYPERTENSION Hypertension status: better  Satisfied with current treatment? yes Duration of hypertension: chronic BP monitoring frequency:  a few times a day BP range: 140s in AM, 110-120 in PM BP medication side effects:  no Medication compliance: excellent compliance Aspirin: No Recurrent headaches: no Visual changes: no Palpitations: no Dyspnea: no Chest pain: no Lower extremity edema: a little bit, but much better Dizzy/lightheaded: no  Relevant past medical, surgical, family and social history reviewed and updated as indicated. Interim medical history since our last visit reviewed. Allergies and medications reviewed and updated.  Review of Systems  Constitutional: Negative.   Respiratory: Negative.   Cardiovascular: Negative.   Psychiatric/Behavioral: Negative.     Per HPI unless specifically indicated above     Objective:    BP 140/86 mmHg  Pulse 82  Temp(Src) 97.7 F (36.5 C)  Wt 188 lb (85.276 kg)  SpO2 96%  Wt Readings from Last 3 Encounters:  02/11/16 188 lb (85.276 kg)  01/14/16 188 lb (85.276 kg)  05/10/15 181 lb (82.101 kg)    Physical Exam  Constitutional: She is oriented to person, place, and time. She appears well-developed and well-nourished. No distress.  HENT:  Head: Normocephalic and atraumatic.  Right Ear: Hearing normal.  Left Ear: Hearing normal.  Nose: Nose normal.  Eyes: Conjunctivae and lids are normal. Right eye exhibits no discharge. Left eye exhibits no discharge. No scleral icterus.  Cardiovascular: Normal rate, regular rhythm, normal heart sounds and intact distal  pulses.  Exam reveals no gallop and no friction rub.   No murmur heard. Pulmonary/Chest: Effort normal and breath sounds normal. No respiratory distress. She has no wheezes. She has no rales. She exhibits no tenderness.  Musculoskeletal: Normal range of motion.  Neurological: She is alert and oriented to person, place, and time.  Skin: Skin is warm, dry and intact. No rash noted. No erythema. No pallor.  Psychiatric: She has a normal mood and affect. Her speech is normal and behavior is normal. Judgment and thought content normal. Cognition and memory are normal.  Nursing note and vitals reviewed.   Results for orders placed or performed in visit on 01/14/16  CBC with Differential/Platelet  Result Value Ref Range   WBC 9.5 3.4 - 10.8 x10E3/uL   RBC 5.01 3.77 - 5.28 x10E6/uL   Hemoglobin 15.8 11.1 - 15.9 g/dL   Hematocrit 46.6 34.0 - 46.6 %   MCV 93 79 - 97 fL   MCH 31.5 26.6 - 33.0 pg   MCHC 33.9 31.5 - 35.7 g/dL   RDW 13.1 12.3 - 15.4 %   Platelets 251 150 - 379 x10E3/uL   Neutrophils 51 %   Lymphs 39 %   Monocytes 9 %   Eos 1 %   Basos 0 %   Neutrophils Absolute 4.7 1.4 - 7.0 x10E3/uL   Lymphocytes Absolute 3.7 (H) 0.7 - 3.1 x10E3/uL   Monocytes Absolute 0.8 0.1 - 0.9 x10E3/uL   EOS (ABSOLUTE) 0.1 0.0 - 0.4 x10E3/uL   Basophils Absolute 0.0 0.0 -  0.2 x10E3/uL   Immature Granulocytes 0 %   Immature Grans (Abs) 0.0 0.0 - 0.1 x10E3/uL  Comprehensive metabolic panel  Result Value Ref Range   Glucose 88 65 - 99 mg/dL   BUN 18 8 - 27 mg/dL   Creatinine, Ser 0.58 0.57 - 1.00 mg/dL   GFR calc non Af Amer 96 >59 mL/min/1.73   GFR calc Af Amer 111 >59 mL/min/1.73   BUN/Creatinine Ratio 31 (H) 12 - 28   Sodium 140 134 - 144 mmol/L   Potassium 4.5 3.5 - 5.2 mmol/L   Chloride 99 96 - 106 mmol/L   CO2 25 18 - 29 mmol/L   Calcium 9.7 8.7 - 10.3 mg/dL   Total Protein 6.8 6.0 - 8.5 g/dL   Albumin 4.1 3.6 - 4.8 g/dL   Globulin, Total 2.7 1.5 - 4.5 g/dL   Albumin/Globulin Ratio 1.5  1.2 - 2.2   Bilirubin Total 0.2 0.0 - 1.2 mg/dL   Alkaline Phosphatase 81 39 - 117 IU/L   AST 23 0 - 40 IU/L   ALT 38 (H) 0 - 32 IU/L  Microalbumin, Urine Waived  Result Value Ref Range   Microalb, Ur Waived 30 (H) 0 - 19 mg/L   Creatinine, Urine Waived 100 10 - 300 mg/dL   Microalb/Creat Ratio 30-300 (H) <30 mg/g  TSH  Result Value Ref Range   TSH 2.080 0.450 - 4.500 uIU/mL      Assessment & Plan:   Problem List Items Addressed This Visit      Cardiovascular and Mediastinum   Hypertension - Primary    Under better control at this time. Continue current regimen. Rechecking BMP today. Call with any concerns.       Relevant Medications   hydrochlorothiazide (HYDRODIURIL) 25 MG tablet   Other Relevant Orders   Basic metabolic panel     Musculoskeletal and Integument   Osteopenia   Relevant Orders   DG Bone Density    Other Visit Diagnoses    Visit for screening mammogram        Order put in today. Await results.    Relevant Orders    MM SCREENING BREAST TOMO BILATERAL    Need for hepatitis C screening test        Drawn today. Await results.    Relevant Orders    Hepatitis C Antibody        Follow up plan: Return 2-3 months for Wellness Exam.

## 2016-02-11 NOTE — Assessment & Plan Note (Signed)
Under better control at this time. Continue current regimen. Rechecking BMP today. Call with any concerns.

## 2016-02-12 ENCOUNTER — Encounter: Payer: Self-pay | Admitting: Family Medicine

## 2016-02-12 LAB — HEPATITIS C ANTIBODY

## 2016-02-12 LAB — BASIC METABOLIC PANEL
BUN/Creatinine Ratio: 23 (ref 12–28)
BUN: 14 mg/dL (ref 8–27)
CO2: 25 mmol/L (ref 18–29)
CREATININE: 0.6 mg/dL (ref 0.57–1.00)
Calcium: 9.7 mg/dL (ref 8.7–10.3)
Chloride: 97 mmol/L (ref 96–106)
GFR calc Af Amer: 110 mL/min/{1.73_m2} (ref >59)
GFR calc non Af Amer: 95 mL/min/{1.73_m2} (ref >59)
GLUCOSE: 123 mg/dL — AB (ref 65–99)
Potassium: 4.1 mmol/L (ref 3.5–5.2)
Sodium: 138 mmol/L (ref 134–144)

## 2016-03-12 NOTE — H&P (Signed)
Mary Galvan is a 65 y.o. female here for Saline sono .pt here for pmb  F/up . Prior h/o of endometrial polyps Pt had a large endometrial polyp removed 4x2 x0.3 cm removed .  EMBX showed no endometrial tissue , polyp benign  Saline today shows 1x1.3 cm endometrial mass  Past Medical History:  has a past medical history of Fatty liver; Seasonal affective disorder (CMS-HCC); and Status post cervical polyp removal.  Past Surgical History:  has a past surgical history that includes Cholecystectomy. Family History: family history includes Diabetes type II in her other; Hypertension in her other. Social History:  reports that she has quit smoking. She does not have any smokeless tobacco history on file. She reports that she drinks alcohol. She reports that she does not use illicit drugs. OB/GYN History:  OB History    Gravida Para Term Preterm AB TAB SAB Ectopic Multiple Living   2 2 2       2       Allergies: has No Known Allergies. Medications:  Current Outpatient Prescriptions:  .  cholecalciferol (VITAMIN D3) 2,000 unit tablet, Take by mouth., Disp: , Rfl:  .  Compound Medication, Estriol 1mg /g Use 1/4 app per vagina twice weekly  Disp 30 g tube with 2 rf Called to [facility], Disp: 1 each, Rfl: 2  Review of Systems: General:                                          No fatigue or weight loss Eyes:                                                         No vision changes Ears:                                                          No hearing difficulty Respiratory:                No cough or shortness of breath Pulmonary:                                      No asthma or shortness of breath Cardiovascular:                     No chest pain, palpitations, dyspnea on exertion Gastrointestinal:                    No abdominal bloating, chronic diarrhea, constipations, masses, pain or hematochezia Genitourinary:                                 No hematuria, dysuria, abnormal  vaginal discharge, pelvic pain, Menometrorrhagia Lymphatic:  No swollen lymph nodes Musculoskeletal:                   No muscle weakness Neurologic:                                      No extremity weakness, syncope, seizure disorder Psychiatric:                                      No history of depression, delusions or suicidal/homicidal ideation    Exam:      Vitals:   01/24/16 1432  BP: 137/90  Pulse: 88    Body mass index is 31.45 kg/(m^2).  WDWN white/  female in NAD    Neck:  no thyromegaly Abdomen: soft , no mass, normal active bowel sounds,  non-tender, no rebound tenderness Pelvic: tanner stage 5 ,  External genitalia: vulva /labia no lesions Urethra: no prolapse Vagina: normal physiologic d/c Cervix: no lesions, no cervical motion tenderness   Uterus: normal size shape and contour, non-tender Adnexa: no mass,  non-tender   Saline infusion sonohysterography: betadine prep to the cervix followed by placement of the HSG catheter into the endometrial canal . Sterile H2O is injected while performing a transvaginal u/s . Findings: 1.3x1.0 cm scalloped mass in the uterus   Impression:   The primary encounter diagnosis was PMB (postmenopausal bleeding). A diagnosis of Endometrial polyp was also pertinent to this visit.    Plan:  Options discussed with the pt  Recommend a fractional d+c and polypectomy   risks of the procedure discussed with the pt

## 2016-03-12 NOTE — H&P (Signed)
Will also perform a hysteroscopy and possible myosure if large polyp is seen

## 2016-03-13 ENCOUNTER — Encounter
Admission: RE | Admit: 2016-03-13 | Discharge: 2016-03-13 | Disposition: A | Discharge status: Home / Self Care | Payer: Medicare Other | Source: Ambulatory Visit | Attending: Obstetrics and Gynecology | Admitting: Obstetrics and Gynecology

## 2016-03-13 DIAGNOSIS — Z01812 Encounter for preprocedural laboratory examination: Secondary | ICD-10-CM | POA: Diagnosis not present

## 2016-03-13 DIAGNOSIS — Z01810 Encounter for preprocedural cardiovascular examination: Secondary | ICD-10-CM | POA: Diagnosis not present

## 2016-03-13 HISTORY — DX: Other seasonal allergic rhinitis: J30.2

## 2016-03-13 LAB — CBC
HCT: 42.8 % (ref 35.0–47.0)
Hemoglobin: 14.7 g/dL (ref 12.0–16.0)
MCH: 31 pg (ref 26.0–34.0)
MCHC: 34.2 g/dL (ref 32.0–36.0)
MCV: 90.6 fL (ref 80.0–100.0)
PLATELETS: 206 10*3/uL (ref 150–440)
RBC: 4.73 MIL/uL (ref 3.80–5.20)
RDW: 13.3 % (ref 11.5–14.5)
WBC: 6.7 10*3/uL (ref 3.6–11.0)

## 2016-03-13 LAB — BASIC METABOLIC PANEL
Anion gap: 9 (ref 5–15)
BUN: 17 mg/dL (ref 6–20)
CALCIUM: 9.5 mg/dL (ref 8.9–10.3)
CHLORIDE: 103 mmol/L (ref 101–111)
CO2: 28 mmol/L (ref 22–32)
CREATININE: 0.54 mg/dL (ref 0.44–1.00)
Glucose, Bld: 93 mg/dL (ref 65–99)
Potassium: 3.6 mmol/L (ref 3.5–5.1)
SODIUM: 140 mmol/L (ref 135–145)

## 2016-03-13 LAB — TYPE AND SCREEN
ABO/RH(D): O POS
ANTIBODY SCREEN: NEGATIVE

## 2016-03-13 LAB — SURGICAL PCR SCREEN
MRSA, PCR: NEGATIVE
Staphylococcus aureus: NEGATIVE

## 2016-03-13 LAB — ABO/RH: ABO/RH(D): O POS

## 2016-03-13 NOTE — Patient Instructions (Signed)
  Your procedure is scheduled on: 03/28/16 Fri Report to Same Day Surgery 2nd floor medical mall To find out your arrival time please call [phone] between 1PM - 3PM on 03/27/16 Thurs Remember: Instructions that are not followed completely may result in serious medical risk, up to and including death, or upon the discretion of your surgeon and anesthesiologist your surgery may need to be rescheduled.    _x___ 1. Do not eat food or drink liquids after midnight. No gum chewing or hard candies.     __x__ 2. No Alcohol for 24 hours before or after surgery.   ____ 3. Bring all medications with you on the day of surgery if instructed.    __x__ 4. Notify your doctor if there is any change in your medical condition     (cold, fever, infections).     Do not wear jewelry, make-up, hairpins, clips or nail polish.  Do not wear lotions, powders, or perfumes. You may wear deodorant.  Do not shave 48 hours prior to surgery. Men may shave face and neck.  Do not bring valuables to the hospital.    [facility] is not responsible for any belongings or valuables.               Contacts, dentures or bridgework may not be worn into surgery.  Leave your suitcase in the car. After surgery it may be brought to your room.  For patients admitted to the hospital, discharge time is determined by your treatment team.   Patients discharged the day of surgery will not be allowed to drive home.    Please read over the following fact sheets that you were given:   [facility] Preparing for Surgery and or MRSA Information   _x___ Take these medicines the morning of surgery with A SIP OF WATER:    1. ranitidine (ZANTAC) 300 MG tablet  2.  3.  4.  5.  6.  ____ Fleet Enema (as directed)   _x___ Use CHG Soap or sage wipes as directed on instruction sheet   ____ Use inhalers on the day of surgery and bring to hospital day of surgery  ____ Stop metformin 2 days prior to surgery    ____ Take 1/2 of usual  insulin dose the night before surgery and none on the morning of           surgery.   ____ Stop aspirin or coumadin, or plavix  _x__ Stop Anti-inflammatories such as Advil, Aleve, Ibuprofen, Motrin, Naproxen,          Naprosyn, Goodies powders or aspirin products. Ok to take Tylenol.   ____ Stop supplements until after surgery.    ____ Bring C-Pap to the hospital.

## 2016-03-18 ENCOUNTER — Other Ambulatory Visit: Payer: Self-pay | Admitting: *Deleted

## 2016-03-18 ENCOUNTER — Inpatient Hospital Stay
Admission: RE | Admit: 2016-03-18 | Discharge: 2016-03-18 | Disposition: A | Discharge status: Home / Self Care | Payer: Self-pay | Source: Ambulatory Visit | Attending: *Deleted | Admitting: *Deleted

## 2016-03-18 DIAGNOSIS — Z9289 Personal history of other medical treatment: Secondary | ICD-10-CM

## 2016-03-28 ENCOUNTER — Ambulatory Visit
Admission: RE | Admit: 2016-03-28 | Discharge: 2016-03-28 | Disposition: A | Discharge status: Home / Self Care | Payer: Medicare Other | Source: Ambulatory Visit | Attending: Obstetrics and Gynecology | Admitting: Obstetrics and Gynecology

## 2016-03-28 ENCOUNTER — Encounter: Payer: Self-pay | Admitting: *Deleted

## 2016-03-28 ENCOUNTER — Encounter
Admission: RE | Disposition: A | Discharge status: Home / Self Care | Payer: Self-pay | Source: Ambulatory Visit | Attending: Obstetrics and Gynecology

## 2016-03-28 ENCOUNTER — Ambulatory Visit: Payer: Medicare Other | Admitting: Anesthesiology

## 2016-03-28 DIAGNOSIS — Z833 Family history of diabetes mellitus: Secondary | ICD-10-CM | POA: Diagnosis not present

## 2016-03-28 DIAGNOSIS — Z9049 Acquired absence of other specified parts of digestive tract: Secondary | ICD-10-CM | POA: Diagnosis not present

## 2016-03-28 DIAGNOSIS — F418 Other specified anxiety disorders: Secondary | ICD-10-CM | POA: Diagnosis not present

## 2016-03-28 DIAGNOSIS — I739 Peripheral vascular disease, unspecified: Secondary | ICD-10-CM | POA: Diagnosis not present

## 2016-03-28 DIAGNOSIS — I10 Essential (primary) hypertension: Secondary | ICD-10-CM | POA: Insufficient documentation

## 2016-03-28 DIAGNOSIS — N840 Polyp of corpus uteri: Secondary | ICD-10-CM | POA: Diagnosis not present

## 2016-03-28 DIAGNOSIS — Z79899 Other long term (current) drug therapy: Secondary | ICD-10-CM | POA: Diagnosis not present

## 2016-03-28 DIAGNOSIS — K219 Gastro-esophageal reflux disease without esophagitis: Secondary | ICD-10-CM | POA: Diagnosis not present

## 2016-03-28 DIAGNOSIS — Z87891 Personal history of nicotine dependence: Secondary | ICD-10-CM | POA: Diagnosis not present

## 2016-03-28 DIAGNOSIS — Z8249 Family history of ischemic heart disease and other diseases of the circulatory system: Secondary | ICD-10-CM | POA: Diagnosis not present

## 2016-03-28 DIAGNOSIS — M1990 Unspecified osteoarthritis, unspecified site: Secondary | ICD-10-CM | POA: Diagnosis not present

## 2016-03-28 DIAGNOSIS — K760 Fatty (change of) liver, not elsewhere classified: Secondary | ICD-10-CM | POA: Insufficient documentation

## 2016-03-28 DIAGNOSIS — N950 Postmenopausal bleeding: Secondary | ICD-10-CM | POA: Diagnosis not present

## 2016-03-28 HISTORY — PX: DILATATION & CURETTAGE/HYSTEROSCOPY WITH MYOSURE: SHX6511

## 2016-03-28 LAB — TYPE AND SCREEN
ABO/RH(D): O POS
Antibody Screen: NEGATIVE

## 2016-03-28 SURGERY — DILATATION & CURETTAGE/HYSTEROSCOPY WITH MYOSURE
Anesthesia: General

## 2016-03-28 MED ORDER — DEXAMETHASONE SODIUM PHOSPHATE 10 MG/ML IJ SOLN
INTRAMUSCULAR | Status: DC | PRN
  Administered 2016-03-28: 5 mg via INTRAVENOUS

## 2016-03-28 MED ORDER — FENTANYL CITRATE (PF) 100 MCG/2ML IJ SOLN
INTRAMUSCULAR | Status: DC | PRN
  Administered 2016-03-28: 50 ug via INTRAVENOUS

## 2016-03-28 MED ORDER — LACTATED RINGERS IV SOLN: INTRAVENOUS | Status: DC

## 2016-03-28 MED ORDER — LIDOCAINE HCL (CARDIAC) 20 MG/ML IV SOLN
INTRAVENOUS | Status: DC | PRN
  Administered 2016-03-28: 50 mg via INTRAVENOUS

## 2016-03-28 MED ORDER — ONDANSETRON HCL 4 MG/2ML IJ SOLN: 4.0000 mg | Freq: Once | INTRAMUSCULAR | Status: DC | PRN

## 2016-03-28 MED ORDER — HYDROCODONE-ACETAMINOPHEN 7.5-325 MG PO TABS: 1.0000 | ORAL_TABLET | Freq: Once | ORAL | Status: DC | PRN

## 2016-03-28 MED ORDER — CEFOXITIN SODIUM-DEXTROSE 2-2.2 GM-% IV SOLR (PREMIX)
INTRAVENOUS | Status: AC
  Administered 2016-03-28: 2000 mg via INTRAVENOUS
  Filled 2016-03-28: qty 50

## 2016-03-28 MED ORDER — PROPOFOL 10 MG/ML IV BOLUS
INTRAVENOUS | Status: DC | PRN
  Administered 2016-03-28: 135 mg via INTRAVENOUS

## 2016-03-28 MED ORDER — CEFOXITIN SODIUM-DEXTROSE 2-2.2 GM-% IV SOLR (PREMIX)
2.0000 g | INTRAVENOUS | Status: AC
  Administered 2016-03-28: 2000 mg via INTRAVENOUS

## 2016-03-28 MED ORDER — ACETAMINOPHEN 160 MG/5ML PO SOLN
325.0000 mg | ORAL | Status: DC | PRN
  Filled 2016-03-28: qty 20.3

## 2016-03-28 MED ORDER — LACTATED RINGERS IV SOLN
INTRAVENOUS | Status: DC
  Administered 2016-03-28 (×2): via INTRAVENOUS

## 2016-03-28 MED ORDER — ONDANSETRON HCL 4 MG/2ML IJ SOLN
INTRAMUSCULAR | Status: DC | PRN
  Administered 2016-03-28: 4 mg via INTRAVENOUS

## 2016-03-28 MED ORDER — FENTANYL CITRATE (PF) 100 MCG/2ML IJ SOLN: 25.0000 ug | INTRAMUSCULAR | Status: DC | PRN

## 2016-03-28 MED ORDER — MIDAZOLAM HCL 2 MG/2ML IJ SOLN
INTRAMUSCULAR | Status: DC | PRN
  Administered 2016-03-28: 1 mg via INTRAVENOUS

## 2016-03-28 MED ORDER — SOD CITRATE-CITRIC ACID 500-334 MG/5ML PO SOLN
30.0000 mL | ORAL | Status: DC
  Filled 2016-03-28: qty 30

## 2016-03-28 MED ORDER — ACETAMINOPHEN 325 MG PO TABS: 325.0000 mg | ORAL_TABLET | ORAL | Status: DC | PRN

## 2016-03-28 SURGICAL SUPPLY — 19 items
ABLATOR ENDOMETRIAL MYOSURE (ABLATOR) IMPLANT
CANISTER SUC SOCK COL 7IN (MISCELLANEOUS) ×2 IMPLANT
CANISTER SUCT 3000ML (MISCELLANEOUS) ×2 IMPLANT
CATH ROBINSON RED A/P 16FR (CATHETERS) ×2 IMPLANT
GLOVE BIO SURGEON STRL SZ8 (GLOVE) ×2 IMPLANT
GOWN STRL REUS W/ TWL LRG LVL3 (GOWN DISPOSABLE) ×1 IMPLANT
GOWN STRL REUS W/ TWL XL LVL3 (GOWN DISPOSABLE) ×1 IMPLANT
GOWN STRL REUS W/TWL LRG LVL3 (GOWN DISPOSABLE) ×1
GOWN STRL REUS W/TWL XL LVL3 (GOWN DISPOSABLE) ×1
KIT RM TURNOVER CYSTO AR (KITS) ×2 IMPLANT
MYOSURE LITE POLYP REMOVAL (MISCELLANEOUS) ×2 IMPLANT
PACK DNC HYST (MISCELLANEOUS) ×2 IMPLANT
PAD OB MATERNITY 4.3X12.25 (PERSONAL CARE ITEMS) ×2 IMPLANT
PAD PREP 24X41 OB/GYN DISP (PERSONAL CARE ITEMS) ×2 IMPLANT
SOL .9 NS 3000ML IRR  AL (IV SOLUTION) ×1
SOL .9 NS 3000ML IRR UROMATIC (IV SOLUTION) ×1 IMPLANT
TOWEL OR 17X26 4PK STRL BLUE (TOWEL DISPOSABLE) ×2 IMPLANT
TUBING CONNECTING 10 (TUBING) ×2 IMPLANT
TUBING HYSTEROSCOPY DOLPHIN (MISCELLANEOUS) ×2 IMPLANT

## 2016-03-28 NOTE — Anesthesia Preprocedure Evaluation (Addendum)
Anesthesia Evaluation  Patient identified by MRN, date of birth, ID band Patient awake    Reviewed: Allergy & Precautions, NPO status , Patient's Chart, lab work & pertinent test results, reviewed documented beta blocker date and time   History of Anesthesia Complications (+) PONV and history of anesthetic complications  Airway Mallampati: II  TM Distance: >3 FB     Dental  (+) Chipped   Pulmonary former smoker,           Cardiovascular hypertension, Pt. on medications + Peripheral Vascular Disease       Neuro/Psych Seizures -,  PSYCHIATRIC DISORDERS Anxiety Depression    GI/Hepatic GERD  Controlled,  Endo/Other    Renal/GU Renal disease     Musculoskeletal  (+) Arthritis ,   Abdominal   Peds  Hematology   Anesthesia Other Findings Head injury in 1970  With one seizure - OK now. EKG OK.  Reproductive/Obstetrics                            Anesthesia Physical Anesthesia Plan  ASA: III  Anesthesia Plan: General   Post-op Pain Management:    Induction: Intravenous  Airway Management Planned: LMA  Additional Equipment:   Intra-op Plan:   Post-operative Plan:   Informed Consent: I have reviewed the patients History and Physical, chart, labs and discussed the procedure including the risks, benefits and alternatives for the proposed anesthesia with the patient or authorized representative who has indicated his/her understanding and acceptance.     Plan Discussed with: CRNA  Anesthesia Plan Comments:         Anesthesia Quick Evaluation

## 2016-03-28 NOTE — Brief Op Note (Signed)
03/28/2016  8:08 AM  PATIENT:  Mary Galvan  65 y.o. female  PRE-OPERATIVE DIAGNOSIS:  PMB, endometrial polyp  POST-OPERATIVE DIAGNOSIS:  PMB, endometrial polyp  PROCEDURE:  Procedure(s): DILATATION & CURETTAGE/HYSTEROSCOPY WITH MYOSURE (N/A)  SURGEON:  Surgeon(s) and Role:    Boykin Nearing, MD - Primary  PHYSICIAN ASSISTANT:   ASSISTANTS:scrub tech Doman  ANESTHESIA:   general  EBL:  Total I/O In: 250 [I.V.:250] Out: 5 [Urine:200; Blood:2]  BLOOD ADMINISTERED:none  DRAINS: none   LOCAL MEDICATIONS USED:  NONE  SPECIMEN:  Source of Specimen:  ecc, endometrial curettage, endometrial polyp  DISPOSITION OF SPECIMEN:  PATHOLOGY  COUNTS:  YES  TOURNIQUET:  * No tourniquets in log *  DICTATION: .Other Dictation: Dictation Number verbal  PLAN OF CARE: Discharge to home after PACU  PATIENT DISPOSITION:  PACU - hemodynamically stable.   Delay start of Pharmacological VTE agent (>24hrs) due to surgical blood loss or risk of bleeding: not applicable

## 2016-03-28 NOTE — Op Note (Signed)
NAME:  Mary, Galvan NO.:  [id number]  MEDICAL RECORD NO.:  [id number]  LOCATION:  [facility]                         FACILITY:  [facility]  PHYSICIAN:  Laverta Baltimore, MDDATE OF BIRTH:  08/03/1951  DATE OF PROCEDURE: DATE OF DISCHARGE:                              OPERATIVE REPORT   PREOPERATIVE DIAGNOSIS: 1. Postmenopausal bleeding. 2. Endometrial polyp.  POSTOPERATIVE DIAGNOSIS: 1. Postmenopausal bleeding. 2. Endometrial polyp.  PROCEDURE PERFORMED: 1. Fractional dilation and curettage. 2. Hysteroscopy. 3. MyoSure resection of endometrial polyp.  SURGEON:  Laverta Baltimore, MD  SURGEON:  Laverta Baltimore, MD.  FIRST ASSISTANT:  Scrub tech Doman.  ANESTHESIA:  General endotracheal anesthesia.  INDICATIONS:  A 65 year old female with postmenopausal bleeding.  The patient with a prior history of an endometrial polyp that was resected. On saline infusion sonohysterography, there appears to be a 1.2 x 1 cm endometrial mass consistent with a polyp.  DESCRIPTION OF PROCEDURE:  After adequate general endotracheal anesthesia, the patient was placed in dorsal supine position.  Lower abdomen, perineum, and vagina were prepped and draped in normal sterile fashion.  A time-out was performed.  A straight red Robinson catheter was performed yielded 200 mL of clear urine.  A weighted speculum was then placed in the posterior vaginal vault.  The anterior cervix was grasped with a single-tooth tenaculum.  Endocervical curettage was performed followed by uterine sounding to 7 cm.  The cervix was dilated to #15 Hanks dilator and the hysteroscope was advanced into the endometrial cavity without difficulty.  The polypoid mass was identified in the uterine cavity.  The MyoSure was brought up to the operative field and MyoSure resection was performed with good total removal of the endometrial mass.  Pictures were taken.  An endometrial curetting was then performed.   Good hemostasis was noted.  The procedure was terminated.  There were no complications.  ESTIMATED BLOOD LOSS:  Minimal.  INTRAOPERATIVE FLUIDS:  250 mL.  URINE OUTPUT:  200 mL.  The patient was taken to recovery room in good condition.          ______________________________ Laverta Baltimore, MD     TS/MEDQ  D:  03/28/2016  T:  03/28/2016  Job:  [id number]

## 2016-03-28 NOTE — Transfer of Care (Signed)
Immediate Anesthesia Transfer of Care Note  Patient: ASHAI CASABLANCA  Procedure(s) Performed: Procedure(s): DILATATION & CURETTAGE/HYSTEROSCOPY WITH MYOSURE (N/A)  Patient Location: PACU  Anesthesia Type:General  Level of Consciousness: awake  Airway & Oxygen Therapy: Patient Spontanous Breathing  Post-op Assessment: Report given to RN  Post vital signs: stable  Last Vitals:  Filed Vitals:   03/28/16 0655 03/28/16 0815  BP: 149/87   Pulse: 76   Temp: 36.8 C 36.4 C  Resp: 20     Last Pain: There were no vitals filed for this visit.    Patients Stated Pain Goal: 2 ([id number])  Complications: No apparent anesthesia complications  Past Medical History  Diagnosis Date  . Rosacea   . Seizures ([place])     x1 - after head trauma. 1970's  . Heart murmur   . Arthritis     thumbs  . Fatty liver disease, nonalcoholic   . Motion sickness     reading in cars  . GERD (gastroesophageal reflux disease)   . Anxiety   . Hyperlipidemia   . Hypertension   . Vitreous detachment of left eye July 2012    [facility]  . Seasonal allergies   . Depression   . Seasonal affective disorder ([place])   . Kidney cysts   . PONV (postoperative nausea and vomiting)     slow to wake   Past Surgical History  Procedure Laterality Date  . Cholecystectomy  2015  . Colonoscopy with propofol N/A 05/10/2015    Procedure: COLONOSCOPY WITH PROPOFOL;  Surgeon: Lucilla Lame, MD;  Location: [place];  Service: Endoscopy;  Laterality: N/A;  WITH BIOPSY-- SIGMOID COLON POLYP  X  4 DESCENDING COLON POLYP   Scheduled Meds: . sodium citrate-citric acid  30 mL Oral On Call to OR   Continuous Infusions: . lactated ringers 125 mL/hr at 03/28/16 0703  . lactated ringers     PRN Meds:.acetaminophen **OR** acetaminophen (TYLENOL) oral liquid 160 mg/5 mL, HYDROcodone-acetaminophen, ondansetron (ZOFRAN) IV

## 2016-03-28 NOTE — Anesthesia Procedure Notes (Signed)
Procedure Name: LMA Insertion Date/Time: 03/28/2016 7:39 AM Performed by: Zetta Bills Pre-anesthesia Checklist: Patient identified, Emergency Drugs available, Suction available and Patient being monitored Patient Re-evaluated:Patient Re-evaluated prior to inductionOxygen Delivery Method: Circle system utilized Preoxygenation: Pre-oxygenation with 100% oxygen Intubation Type: IV induction LMA: LMA inserted LMA Size: 4.0 Number of attempts: 1 Placement Confirmation: positive ETCO2,  CO2 detector and breath sounds checked- equal and bilateral Dental Injury: Teeth and Oropharynx as per pre-operative assessment

## 2016-03-28 NOTE — Anesthesia Postprocedure Evaluation (Signed)
Anesthesia Post Note  Patient: Mary Galvan  Procedure(s) Performed: Procedure(s) (LRB): DILATATION & CURETTAGE/HYSTEROSCOPY WITH MYOSURE (N/A)  Patient location during evaluation: PACU Anesthesia Type: General Level of consciousness: awake and alert Pain management: pain level controlled Vital Signs Assessment: post-procedure vital signs reviewed and stable Respiratory status: spontaneous breathing, nonlabored ventilation, respiratory function stable and patient connected to nasal cannula oxygen Cardiovascular status: blood pressure returned to baseline and stable Postop Assessment: no signs of nausea or vomiting Anesthetic complications: no    Last Vitals:  Filed Vitals:   03/28/16 0904 03/28/16 0920  BP: 126/76 127/77  Pulse: 61 60  Temp: 36.3 C   Resp: 15 16    Last Pain:  Filed Vitals:   03/28/16 0932  PainSc: [place]

## 2016-03-28 NOTE — Discharge Instructions (Signed)
Hysteroscopy, Care After Refer to this sheet in the next few weeks. These instructions provide you with information on caring for yourself after your procedure. Your health care provider may also give you more specific instructions. Your treatment has been planned according to current medical practices, but problems sometimes occur. Call your health care provider if you have any problems or questions after your procedure.  WHAT TO EXPECT AFTER THE PROCEDURE After your procedure, it is typical to have the following:  You may have some cramping. This normally lasts for a couple days.  You may have bleeding. This can vary from light spotting for a few days to menstrual-like bleeding for 3-7 days. HOME CARE INSTRUCTIONS  Rest for the first 1-2 days after the procedure.  Only take over-the-counter or prescription medicines as directed by your health care provider. Do not take aspirin. It can increase the chances of bleeding.  Take showers instead of baths for 2 weeks or as directed by your health care provider.  Do not drive for 24 hours or as directed.  Do not drink alcohol while taking pain medicine.  Do not use tampons, douche, or have sexual intercourse for 2 weeks or until your health care provider says it is okay.  Take your temperature twice a day for 4-5 days. Write it down each time.  Follow your health care provider's advice about diet, exercise, and lifting.  If you develop constipation, you may:  Take a mild laxative if your health care provider approves.  Add bran foods to your diet.  Drink enough fluids to keep your urine clear or pale yellow.  Try to have someone with you or available to you for the first 24-48 hours, especially if you were given a general anesthetic.  Follow up with your health care provider as directed. SEEK MEDICAL CARE IF:  You feel dizzy or lightheaded.  You feel sick to your stomach (nauseous).  You have abnormal vaginal discharge.  You  have a rash.  You have pain that is not controlled with medicine. SEEK IMMEDIATE MEDICAL CARE IF:  You have bleeding that is heavier than a normal menstrual period.  You have a fever.  You have increasing cramps or pain, not controlled with medicine.  You have new belly (abdominal) pain.  You pass out.  You have pain in the tops of your shoulders (shoulder strap areas).  You have shortness of breath.   This information is not intended to replace advice given to you by your health care provider. Make sure you discuss any questions you have with your health care provider.   Document Released: 07/20/2013 Document Reviewed: 07/20/2013 Elsevier Interactive Patient Education [employer].

## 2016-03-28 NOTE — H&P (Signed)
CV RRR without murmur  Lungs CTA  Pt ready for surgery All questions answered  NPO

## 2016-03-31 LAB — SURGICAL PATHOLOGY

## 2016-04-11 DIAGNOSIS — Z09 Encounter for follow-up examination after completed treatment for conditions other than malignant neoplasm: Secondary | ICD-10-CM | POA: Diagnosis not present

## 2016-04-11 DIAGNOSIS — N941 Unspecified dyspareunia: Secondary | ICD-10-CM | POA: Diagnosis not present

## 2016-04-14 ENCOUNTER — Ambulatory Visit
Admission: RE | Admit: 2016-04-14 | Discharge: 2016-04-14 | Disposition: A | Discharge status: Home / Self Care | Payer: Medicare Other | Source: Ambulatory Visit | Attending: Family Medicine | Admitting: Family Medicine

## 2016-04-14 ENCOUNTER — Encounter: Payer: Self-pay | Admitting: Family Medicine

## 2016-04-14 DIAGNOSIS — Z1231 Encounter for screening mammogram for malignant neoplasm of breast: Secondary | ICD-10-CM | POA: Diagnosis not present

## 2016-04-14 DIAGNOSIS — Z780 Asymptomatic menopausal state: Secondary | ICD-10-CM | POA: Insufficient documentation

## 2016-04-14 DIAGNOSIS — Z720 Tobacco use: Secondary | ICD-10-CM | POA: Diagnosis not present

## 2016-04-14 DIAGNOSIS — Z13820 Encounter for screening for osteoporosis: Secondary | ICD-10-CM | POA: Diagnosis not present

## 2016-04-14 DIAGNOSIS — M8580 Other specified disorders of bone density and structure, unspecified site: Secondary | ICD-10-CM

## 2016-04-14 LAB — HM MAMMOGRAPHY

## 2016-04-16 ENCOUNTER — Encounter: Payer: Self-pay | Admitting: Family Medicine

## 2016-04-21 ENCOUNTER — Ambulatory Visit (INDEPENDENT_AMBULATORY_CARE_PROVIDER_SITE_OTHER): Payer: Medicare Other | Admitting: Family Medicine

## 2016-04-21 ENCOUNTER — Encounter: Payer: Self-pay | Admitting: Family Medicine

## 2016-04-21 VITALS — BP 134/91 | HR 75 | Temp 98.2°F | Ht 65.0 in | Wt 187.0 lb

## 2016-04-21 DIAGNOSIS — E559 Vitamin D deficiency, unspecified: Secondary | ICD-10-CM

## 2016-04-21 DIAGNOSIS — E785 Hyperlipidemia, unspecified: Secondary | ICD-10-CM

## 2016-04-21 DIAGNOSIS — I10 Essential (primary) hypertension: Secondary | ICD-10-CM

## 2016-04-21 DIAGNOSIS — K760 Fatty (change of) liver, not elsewhere classified: Secondary | ICD-10-CM | POA: Diagnosis not present

## 2016-04-21 DIAGNOSIS — Z23 Encounter for immunization: Secondary | ICD-10-CM

## 2016-04-21 DIAGNOSIS — Z0000 Encounter for general adult medical examination without abnormal findings: Secondary | ICD-10-CM | POA: Diagnosis not present

## 2016-04-21 NOTE — Assessment & Plan Note (Signed)
Rechecking levels again today. Await results. Continue OTC supplementation.

## 2016-04-21 NOTE — Assessment & Plan Note (Signed)
Rechecking levels again today. Await results. Continue diet and exercise.

## 2016-04-21 NOTE — Progress Notes (Signed)
BP 134/91 mmHg  Pulse 75  Temp(Src) 98.2 F (36.8 C)  Ht 5\' 5"  (1.651 m)  Wt 187 lb (84.823 kg)  BMI 31.12 kg/m2  SpO2 95%   Subjective:    Patient ID: Mary Galvan, female    DOB: [birth year], 65 y.o.   MRN: [id number]  HPI: Mary Galvan is a 65 y.o. female presenting on 04/21/2016 for comprehensive medical examination. Current medical complaints include:  HYPERTENSION Hypertension status: stable- better in the afternoon Satisfied with current treatment? yes Duration of hypertension: chronic BP monitoring frequency:  A few times a week BP range: 110s/70s-160s/90s BP medication side effects:  no Medication compliance: excellent compliance Aspirin: no Recurrent headaches: no Visual changes: yes- stable with the eye doctor Palpitations: no Dyspnea: yes with heavy activity Chest pain: no Lower extremity edema: yes- at the end of the day Dizzy/lightheaded: no  She currently lives with: husband Menopausal Symptoms: yes- following with GYN  Functional Status Survey: Is the patient deaf or have difficulty hearing?: No Does the patient have difficulty seeing, even when wearing glasses/contacts?: No Does the patient have difficulty concentrating, remembering, or making decisions?: No Does the patient have difficulty walking or climbing stairs?: No Does the patient have difficulty dressing or bathing?: No Does the patient have difficulty doing errands alone such as visiting a doctor's office or shopping?: No  Fall Risk  04/21/2016 05/03/2015  Falls in the past year? No No    Depression Screen Depression screen [facility] 2/9 04/21/2016 05/03/2015  Decreased Interest 0 0  Down, Depressed, Hopeless 0 0  PHQ - 2 Score 0 0    Advanced Directives Does patient have a HCPOA?    no Does patient have a living will or MOST form?  no  Past Medical History:  Past Medical History  Diagnosis Date  . Rosacea   . Seizures ([place])     x1 - after head trauma. 1970's  . Heart murmur   .  Arthritis     thumbs  . Fatty liver disease, nonalcoholic   . Motion sickness     reading in cars  . GERD (gastroesophageal reflux disease)   . Anxiety   . Hyperlipidemia   . Hypertension   . Vitreous detachment of left eye July 2012    [facility]  . Seasonal allergies   . Depression   . Seasonal affective disorder ([place])   . Kidney cysts   . PONV (postoperative nausea and vomiting)     slow to wake    Surgical History:  Past Surgical History  Procedure Laterality Date  . Cholecystectomy  2015  . Colonoscopy with propofol N/A 05/10/2015    Procedure: COLONOSCOPY WITH PROPOFOL;  Surgeon: Lucilla Lame, MD;  Location: [place];  Service: Endoscopy;  Laterality: N/A;  WITH BIOPSY-- SIGMOID COLON POLYP  X  4 DESCENDING COLON POLYP  . Dilatation & curettage/hysteroscopy with myosure N/A 03/28/2016    Procedure: DILATATION & CURETTAGE/HYSTEROSCOPY WITH MYOSURE;  Surgeon: Boykin Nearing, MD;  Location: [facility];  Service: Gynecology;  Laterality: N/A;  . Other surgical history      Uterine Polyp    Medications:  Current Outpatient Prescriptions on File Prior to Visit  Medication Sig  . Cholecalciferol (VITAMIN D3) 2000 UNITS TABS Take 2,000 Units by mouth daily.   . Estriol 10 % CREA 1 Dose by Does not apply route 2 (two) times a week.   . hydrochlorothiazide (HYDRODIURIL) 25 MG tablet Take 1 tablet (25 mg  total) by mouth daily.  . ranitidine (ZANTAC) 300 MG tablet Take 1 tablet (300 mg total) by mouth at bedtime. (Patient taking differently: Take 300 mg by mouth daily as needed. )   No current facility-administered medications on file prior to visit.    Allergies:  No Known Allergies  Social History:  Social History   Social History  . Marital Status: Married    Spouse Name: N/A  . Number of Children: N/A  . Years of Education: N/A   Occupational History  . Not on file.   Social History Main Topics  . Smoking status: Former Smoker -- 1.00  packs/day for 30 years    Types: Cigarettes    Quit date: 03/13/2000  . Smokeless tobacco: Never Used  . Alcohol Use: 1.2 oz/week    2 Cans of beer, 0 Standard drinks or equivalent per week     Comment: occasional  . Drug Use: No  . Sexual Activity:    Partners: Male   Other Topics Concern  . Not on file   Social History Narrative   History  Smoking status  . Former Smoker -- 1.00 packs/day for 30 years  . Types: Cigarettes  . Quit date: 03/13/2000  Smokeless tobacco  . Never Used   History  Alcohol Use  . 1.2 oz/week  . 2 Cans of beer, 0 Standard drinks or equivalent per week    Comment: occasional    Family History:  Family History  Problem Relation Age of Onset  . Cancer Mother     possible lung?  . Dementia Mother   . Cancer Sister     kidney  . Diabetes Sister   . COPD Sister   . Cancer Paternal Grandfather     stomach and prostate  . Diabetes Sister   . Heart disease Sister   . Hypertension Sister   . COPD Sister   . Stroke Maternal Uncle   . Breast cancer Neg Hx     Past medical history, surgical history, medications, allergies, family history and social history reviewed with patient today and changes made to appropriate areas of the chart.   Review of Systems  Constitutional: Positive for diaphoresis. Negative for fever, chills, weight loss and malaise/fatigue.  HENT: Negative.   Eyes: Negative.   Respiratory: Positive for shortness of breath. Negative for cough, hemoptysis, sputum production and wheezing.   Cardiovascular: Positive for leg swelling. Negative for chest pain, palpitations, orthopnea, claudication and PND.  Gastrointestinal: Positive for abdominal pain (epigastric pain- has been fully worked up, was on prilosec without benefit, comes and goes). Negative for heartburn, nausea, vomiting, diarrhea, constipation, blood in stool and melena.  Genitourinary: Negative.   Musculoskeletal: Negative.   Skin: Negative.   Neurological:  Negative.  Negative for weakness.  Endo/Heme/Allergies: Negative.   Psychiatric/Behavioral: Negative.     All other ROS negative except what is listed above and in the HPI.      Objective:    BP 134/91 mmHg  Pulse 75  Temp(Src) 98.2 F (36.8 C)  Ht 5\' 5"  (1.651 m)  Wt 187 lb (84.823 kg)  BMI 31.12 kg/m2  SpO2 95%  Wt Readings from Last 3 Encounters:  04/21/16 187 lb (84.823 kg)  03/28/16 189 lb (85.73 kg)  03/13/16 189 lb (85.73 kg)    Physical Exam  Constitutional: She is oriented to person, place, and time. She appears well-developed and well-nourished. No distress.  HENT:  Head: Normocephalic and atraumatic.  Right Ear: Hearing, tympanic  membrane, external ear and ear canal normal.  Left Ear: Hearing, tympanic membrane, external ear and ear canal normal.  Nose: Nose normal.  Mouth/Throat: Uvula is midline, oropharynx is clear and moist and mucous membranes are normal. No oropharyngeal exudate.    Eyes: Conjunctivae, EOM and lids are normal. Pupils are equal, round, and reactive to light. Right eye exhibits no discharge. Left eye exhibits no discharge. No scleral icterus.  Neck: Normal range of motion. Neck supple. No JVD present. No tracheal deviation present. No thyromegaly present.  Cardiovascular: Normal rate, regular rhythm, normal heart sounds and intact distal pulses.  Exam reveals no gallop and no friction rub.   No murmur heard. Pulmonary/Chest: Effort normal and breath sounds normal. No stridor. No respiratory distress. She has no wheezes. She has no rales. She exhibits no tenderness. Right breast exhibits no inverted nipple, no mass, no nipple discharge, no skin change and no tenderness. Left breast exhibits no inverted nipple, no mass, no nipple discharge, no skin change and no tenderness. Breasts are symmetrical.  Abdominal: Soft. Bowel sounds are normal. She exhibits no distension and no mass. There is no tenderness. There is no rebound and no guarding.    Genitourinary:  Deferred with shared decision making  Musculoskeletal: Normal range of motion. She exhibits no edema or tenderness.  Lymphadenopathy:    She has no cervical adenopathy.  Neurological: She is alert and oriented to person, place, and time. She has normal reflexes. She displays normal reflexes. No cranial nerve deficit. She exhibits normal muscle tone. Coordination normal.  Skin: Skin is warm, dry and intact. No rash noted. She is not diaphoretic. No erythema. No pallor.  Psychiatric: She has a normal mood and affect. Her speech is normal and behavior is normal. Judgment and thought content normal. Cognition and memory are normal.  Nursing note and vitals reviewed.   Cognitive Testing - 6-CIT  Correct? Score   What year is it? yes 0 Yes = 0    No = 4  What month is it? yes 0 Yes = 0    No = 3  Remember:     Pia Mau, [place], [place]     What time is it? yes 0 Yes = 0    No = 3  Count backwards from 20 to 1 yes 0 Correct = 0    1 error = 2   More than 1 error = 4  Say the months of the year in reverse. yes 0 Correct = 0    1 error = 2   More than 1 error = 4  What address did I ask you to remember? yes 2 Correct = 0  1 error = 2    2 error = 4    3 error = 6    4 error = 8    All wrong = 10       TOTAL SCORE  2/28   Interpretation:  Normal  Normal (0-7) Abnormal (8-28)   Results for orders placed or performed during the hospital encounter of 03/28/16  Type and screen [place]  Result Value Ref Range   ABO/RH(D) O POS    Antibody Screen NEG    Sample Expiration 03/31/2016   Surgical pathology  Result Value Ref Range   SURGICAL PATHOLOGY      Surgical Pathology CASE: [id number] PATIENT: Nancyann Zayas Surgical Pathology Report     SPECIMEN SUBMITTED: A. Endocervical curettings B. Endometrial polyp C.  Endometrial curettings  CLINICAL HISTORY: None provided  PRE-OPERATIVE DIAGNOSIS: PMB, endometrial  polyp  POST-OPERATIVE DIAGNOSIS: Same as pre-op     DIAGNOSIS: A. ENDOCERVIX; DILATATION AND CURETTAGE: - BENIGN CERVICAL TISSUE. - SMALL FRAGMENT OF ENDOMETRIAL TISSUE WITH TUBAL METAPLASIA.  B. ENDOMETRIAL POLYP; DILATATION AND CURETTAGE: - BENIGN ENDOMETRIAL POLYP, FRAGMENTS.  C. ENDOMETRIUM; DILATATION AND CURETTAGE: - PREDOMINANTLY BLOOD AND SMALL FRAGMENTS OF BENIGN ENDOMETRIUM.   GROSS DESCRIPTION:  A. Labeled: endocervical curettings  Tissue fragment(s): multiple  Size: aggregate, 1.5 x 1.0 x 0.1 cm  Description: mucoid material and pink fragments  Entirely submitted in one cassette(s).   B. Labeled: endometrial polyp  Tissue fragment(s): multiple  Size: aggregate,  4.5 x 2.5 x 0.3 cm  Description: pink-tan tissue fragments  Entirely submitted in 1-2 cassette(s).   C. Labeled: endometrial curettings  Tissue fragment(s): multiple  Size: aggregate, 2.5 x 2.0 x 0.2 cm  Description: blood clot and pink fragments  Entirely submitted in one cassette(s).  Final Diagnosis performed by Delorse Lek, MD.  Electronically signed 03/31/2016 11:49:36AM    The electronic signature indicates that the named Attending Pathologist has evaluated the specimen  Technical component performed at [facility], [address], [place], [place] [address] Lab: [phone] Dir: Darrick Penna. Evette Doffing, MD  Professional component performed at [facility], [facility], [place], [place], [place] [address] Lab: [phone] Dir: Dellia Nims. Reuel Derby, MD        Assessment & Plan:   Problem List Items Addressed This Visit      Cardiovascular and Mediastinum   Hypertension    Better on recheck. Continue current regimen. Continue to monitor. Labs checked today.      Relevant Orders   Comprehensive metabolic panel   Microalbumin, Urine Waived   TSH   UA/M w/rflx Culture, Routine     Digestive   Fatty liver disease, nonalcoholic    Rechecking levels again  today. Await results. Continue diet and exercise.       Relevant Orders   CBC with Differential/Platelet   Comprehensive metabolic panel   UA/M w/rflx Culture, Routine     Other   Hyperlipidemia    Rechecking levels again today. Await results. Continue diet and exercise.       Relevant Orders   Comprehensive metabolic panel   Lipid Panel w/o Chol/HDL Ratio   UA/M w/rflx Culture, Routine   Vitamin D deficiency    Rechecking levels again today. Await results. Continue OTC supplementation.      Relevant Orders   Comprehensive metabolic panel   UA/M w/rflx Culture, Routine   VITAMIN D 25 Hydroxy (Vit-D Deficiency, Fractures)    Other Visit Diagnoses    Medicare annual wellness visit, subsequent    -  Primary    Preventative care discussed as below. Information given to patient. Up to date. Continue diet and exercise. Call with concerns.     Immunization due        Pneumovax given today. Will check on zoster.    Relevant Orders    Pneumococcal polysaccharide vaccine 23-valent greater than or equal to 2yo subcutaneous/IM (Completed)        Preventative Services:  AAA screening: N/A Health Risk Assessment and Personalized Prevention Plan: Done today Bone Mass Measurements: Up to date Breast Cancer Screening: Up to date CVD Screening: Done today Cervical Cancer Screening: N/A Colon Cancer Screening: Up to date Depression Screening: Up to date Diabetes Screening: Done today Glaucoma Screening: See your eye doctor for this Hepatitis B vaccine:  N/A Hepatitis C screening: Up to date HIV Screening: Declined Flu Vaccine: Up to date Lung cancer Screening: N/A Obesity Screening: Done today Pneumonia Vaccines (2): 2nd vaccine given today STI Screening: N/A  Follow up plan: Return in about 6 months (around 10/22/2016).   LABORATORY TESTING:  - Pap smear: Sees GYN  IMMUNIZATIONS:   - Tdap: Tetanus vaccination status reviewed: last tetanus booster within 10 years. -  Influenza: Up to date - Pneumovax: Administered today - Prevnar: Up to date - Zostavax vaccine: Will check with insurance  SCREENING: -Mammogram: Up to date  - Colonoscopy: Up to date  - Bone Density: Up to date  -Hearing Test: Ordered today  -Spirometry: Not applicable   PATIENT COUNSELING:   Advised to take 1 mg of folate supplement per day if capable of pregnancy.   Sexuality: Discussed sexually transmitted diseases, partner selection, use of condoms, avoidance of unintended pregnancy  and contraceptive alternatives.   Advised to avoid cigarette smoking.  I discussed with the patient that most people either abstain from alcohol or drink within safe limits (<=14/week and <=4 drinks/occasion for males, <=7/weeks and <= 3 drinks/occasion for females) and that the risk for alcohol disorders and other health effects rises proportionally with the number of drinks per week and how often a drinker exceeds daily limits.  Discussed cessation/primary prevention of drug use and availability of treatment for abuse.   Diet: Encouraged to adjust caloric intake to maintain  or achieve ideal body weight, to reduce intake of dietary saturated fat and total fat, to limit sodium intake by avoiding high sodium foods and not adding table salt, and to maintain adequate dietary potassium and calcium preferably from fresh fruits, vegetables, and low-fat dairy products.    stressed the importance of regular exercise  Injury prevention: Discussed safety belts, safety helmets, smoke detector, smoking near bedding or upholstery.   Dental health: Discussed importance of regular tooth brushing, flossing, and dental visits.    NEXT PREVENTATIVE PHYSICAL DUE IN 1 YEAR. Return in about 6 months (around 10/22/2016).

## 2016-04-21 NOTE — Patient Instructions (Addendum)
Health Maintenance, Female Adopting a healthy lifestyle and getting preventive care can go a long way to promote health and wellness. Talk with your health care provider about what schedule of regular examinations is right for you. This is a good chance for you to check in with your provider about disease prevention and staying healthy. In between checkups, there are plenty of things you can do on your own. Experts have done a lot of research about which lifestyle changes and preventive measures are most likely to keep you healthy. Ask your health care provider for more information. WEIGHT AND DIET  Eat a healthy diet  Be sure to include plenty of vegetables, fruits, low-fat dairy products, and lean protein.  Do not eat a lot of foods high in solid fats, added sugars, or salt.  Get regular exercise. This is one of the most important things you can do for your health.  Most adults should exercise for at least 150 minutes each week. The exercise should increase your heart rate and make you sweat (moderate-intensity exercise).  Most adults should also do strengthening exercises at least twice a week. This is in addition to the moderate-intensity exercise.  Maintain a healthy weight  Body mass index (BMI) is a measurement that can be used to identify possible weight problems. It estimates body fat based on height and weight. Your health care provider can help determine your BMI and help you achieve or maintain a healthy weight.  For females 20 years of age and older:   A BMI below 18.5 is considered underweight.  A BMI of 18.5 to 24.9 is normal.  A BMI of 25 to 29.9 is considered overweight.  A BMI of 30 and above is considered obese.  Watch levels of cholesterol and blood lipids  You should start having your blood tested for lipids and cholesterol at 66 years of age, then have this test every 5 years.  You may need to have your cholesterol levels checked more often if:  Your lipid  or cholesterol levels are high.  You are older than 66 years of age.  You are at high risk for heart disease.  CANCER SCREENING   Lung Cancer  Lung cancer screening is recommended for adults 55-80 years old who are at high risk for lung cancer because of a history of smoking.  A yearly low-dose CT scan of the lungs is recommended for people who:  Currently smoke.  Have quit within the past 15 years.  Have at least a 30-pack-year history of smoking. A pack year is smoking an average of one pack of cigarettes a day for 1 year.  Yearly screening should continue until it has been 15 years since you quit.  Yearly screening should stop if you develop a health problem that would prevent you from having lung cancer treatment.  Breast Cancer  Practice breast self-awareness. This means understanding how your breasts normally appear and feel.  It also means doing regular breast self-exams. Let your health care provider know about any changes, no matter how small.  If you are in your 20s or 30s, you should have a clinical breast exam (CBE) by a health care provider every 1-3 years as part of a regular health exam.  If you are 40 or older, have a CBE every year. Also consider having a breast X-ray (mammogram) every year.  If you have a family history of breast cancer, talk to your health care provider about genetic screening.  If you   are at high risk for breast cancer, talk to your health care provider about having an MRI and a mammogram every year.  Breast cancer gene (BRCA) assessment is recommended for women who have family members with BRCA-related cancers. BRCA-related cancers include:  Breast.  Ovarian.  Tubal.  Peritoneal cancers.  Results of the assessment will determine the need for genetic counseling and BRCA1 and BRCA2 testing. Cervical Cancer Your health care provider may recommend that you be screened regularly for cancer of the pelvic organs (ovaries, uterus, and  vagina). This screening involves a pelvic examination, including checking for microscopic changes to the surface of your cervix (Pap test). You may be encouraged to have this screening done every 3 years, beginning at age 21.  For women ages 30-65, health care providers may recommend pelvic exams and Pap testing every 3 years, or they may recommend the Pap and pelvic exam, combined with testing for human papilloma virus (HPV), every 5 years. Some types of HPV increase your risk of cervical cancer. Testing for HPV may also be done on women of any age with unclear Pap test results.  Other health care providers may not recommend any screening for nonpregnant women who are considered low risk for pelvic cancer and who do not have symptoms. Ask your health care provider if a screening pelvic exam is right for you.  If you have had past treatment for cervical cancer or a condition that could lead to cancer, you need Pap tests and screening for cancer for at least 20 years after your treatment. If Pap tests have been discontinued, your risk factors (such as having a new sexual partner) need to be reassessed to determine if screening should resume. Some women have medical problems that increase the chance of getting cervical cancer. In these cases, your health care provider may recommend more frequent screening and Pap tests. Colorectal Cancer  This type of cancer can be detected and often prevented.  Routine colorectal cancer screening usually begins at 66 years of age and continues through 66 years of age.  Your health care provider may recommend screening at an earlier age if you have risk factors for colon cancer.  Your health care provider may also recommend using home test kits to check for hidden blood in the stool.  A small camera at the end of a tube can be used to examine your colon directly (sigmoidoscopy or colonoscopy). This is done to check for the earliest forms of colorectal  cancer.  Routine screening usually begins at age 50.  Direct examination of the colon should be repeated every 5-10 years through 66 years of age. However, you may need to be screened more often if early forms of precancerous polyps or small growths are found. Skin Cancer  Check your skin from head to toe regularly.  Tell your health care provider about any new moles or changes in moles, especially if there is a change in a mole's shape or color.  Also tell your health care provider if you have a mole that is larger than the size of a pencil eraser.  Always use sunscreen. Apply sunscreen liberally and repeatedly throughout the day.  Protect yourself by wearing long sleeves, pants, a wide-brimmed hat, and sunglasses whenever you are outside. HEART DISEASE, DIABETES, AND HIGH BLOOD PRESSURE   High blood pressure causes heart disease and increases the risk of stroke. High blood pressure is more likely to develop in:  People who have blood pressure in the high end   of the normal range (130-139/85-89 mm Hg).  People who are overweight or obese.  People who are African American.  If you are 38-23 years of age, have your blood pressure checked every 3-5 years. If you are 61 years of age or older, have your blood pressure checked every year. You should have your blood pressure measured twice--once when you are at a hospital or clinic, and once when you are not at a hospital or clinic. Record the average of the two measurements. To check your blood pressure when you are not at a hospital or clinic, you can use:  An automated blood pressure machine at a pharmacy.  A home blood pressure monitor.  If you are between 45 years and 39 years old, ask your health care provider if you should take aspirin to prevent strokes.  Have regular diabetes screenings. This involves taking a blood sample to check your fasting blood sugar level.  If you are at a normal weight and have a low risk for diabetes,  have this test once every three years after 66 years of age.  If you are overweight and have a high risk for diabetes, consider being tested at a younger age or more often. PREVENTING INFECTION  Hepatitis B  If you have a higher risk for hepatitis B, you should be screened for this virus. You are considered at high risk for hepatitis B if:  You were born in a country where hepatitis B is common. Ask your health care provider which countries are considered high risk.  Your parents were born in a high-risk country, and you have not been immunized against hepatitis B (hepatitis B vaccine).  You have HIV or AIDS.  You use needles to inject street drugs.  You live with someone who has hepatitis B.  You have had sex with someone who has hepatitis B.  You get hemodialysis treatment.  You take certain medicines for conditions, including cancer, organ transplantation, and autoimmune conditions. Hepatitis C  Blood testing is recommended for:  Everyone born from [birth year] through 1966.  Anyone with known risk factors for hepatitis C. Sexually transmitted infections (STIs)  You should be screened for sexually transmitted infections (STIs) including gonorrhea and chlamydia if:  You are sexually active and are younger than 66 years of age.  You are older than 66 years of age and your health care provider tells you that you are at risk for this type of infection.  Your sexual activity has changed since you were last screened and you are at an increased risk for chlamydia or gonorrhea. Ask your health care provider if you are at risk.  If you do not have HIV, but are at risk, it may be recommended that you take a prescription medicine daily to prevent HIV infection. This is called pre-exposure prophylaxis (PrEP). You are considered at risk if:  You are sexually active and do not regularly use condoms or know the HIV status of your partner(s).  You take drugs by injection.  You are sexually  active with a partner who has HIV. Talk with your health care provider about whether you are at high risk of being infected with HIV. If you choose to begin PrEP, you should first be tested for HIV. You should then be tested every 3 months for as long as you are taking PrEP.  PREGNANCY   If you are premenopausal and you may become pregnant, ask your health care provider about preconception counseling.  If you may  PrEP). You are considered at risk if:    You are sexually active and do not regularly use condoms or know the HIV status of your partner(s).    You take drugs by injection.    You are sexually  active with a partner who has HIV.  Talk with your health care provider about whether you are at high risk of being infected with HIV. If you choose to begin PrEP, you should first be tested for HIV. You should then be tested every 3 months for as long as you are taking PrEP.   PREGNANCY   · If you are premenopausal and you may become pregnant, ask your health care provider about preconception counseling.  · If you may become pregnant, take 400 to 800 micrograms (mcg) of folic acid every day.  · If you want to prevent pregnancy, talk to your health care provider about birth control (contraception).  OSTEOPOROSIS AND MENOPAUSE   · Osteoporosis is a disease in which the bones lose minerals and strength with aging. This can result in serious bone fractures. Your risk for osteoporosis can be identified using a bone density scan.  · If you are 65 years of age or older, or if you are at risk for osteoporosis and fractures, ask your health care provider if you should be screened.  · Ask your health care provider whether you should take a calcium or vitamin D supplement to lower your risk for osteoporosis.  · Menopause may have certain physical symptoms and risks.  · Hormone replacement therapy may reduce some of these symptoms and risks.  Talk to your health care provider about whether hormone replacement therapy is right for you.   HOME CARE INSTRUCTIONS   · Schedule regular health, dental, and eye exams.  · Stay current with your immunizations.    · Do not use any tobacco products including cigarettes, chewing tobacco, or electronic cigarettes.  · If you are pregnant, do not drink alcohol.  · If you are breastfeeding, limit how much and how often you drink alcohol.  · Limit alcohol intake to no more than 1 drink per day for nonpregnant women. One drink equals 12 ounces of beer, 5 ounces of wine, or 1½ ounces of hard liquor.  · Do not use street drugs.  · Do not share needles.  · Ask your health care provider for help if  you need support or information about quitting drugs.  · Tell your health care provider if you often feel depressed.  · Tell your health care provider if you have ever been abused or do not feel safe at home.     This information is not intended to replace advice given to you by your health care provider. Make sure you discuss any questions you have with your health care provider.     Document Released: 04/14/2011 Document Revised: 10/20/2014 Document Reviewed: 08/31/2013  Elsevier Interactive Patient Education ©2016 [employer]  Menopause is a normal process in which your reproductive ability comes to an end. This process happens gradually over a span of months to years, usually between the ages of 48 and 55. Menopause is complete when you have missed 12 consecutive menstrual periods.  It is important to talk with your health care provider about some of the most common conditions that affect postmenopausal women, such as heart disease, cancer, and bone loss (osteoporosis). Adopting a healthy lifestyle and getting preventive care can help to promote your health and wellness. Those actions can also   lower your chances of developing some of these common conditions.  WHAT SHOULD I KNOW ABOUT MENOPAUSE?  During menopause, you may experience a number of symptoms, such as:  · Moderate-to-severe hot flashes.  · Night sweats.  · Decrease in sex drive.  · Mood swings.  · Headaches.  · Tiredness.  · Irritability.  · Memory problems.  · Insomnia.  Choosing to treat or not to treat menopausal changes is an individual decision that you make with your health care provider.  WHAT SHOULD I KNOW ABOUT HORMONE REPLACEMENT THERAPY AND SUPPLEMENTS?  Hormone therapy products are effective for treating symptoms that are associated with menopause, such as hot flashes and night sweats. Hormone replacement carries certain risks, especially as you become older. If you are thinking about using estrogen or estrogen with progestin treatments,  discuss the benefits and risks with your health care provider.  WHAT SHOULD I KNOW ABOUT HEART DISEASE AND STROKE?  Heart disease, heart attack, and stroke become more likely as you age. This may be due, in part, to the hormonal changes that your body experiences during menopause. These can affect how your body processes dietary fats, triglycerides, and cholesterol. Heart attack and stroke are both medical emergencies.  There are many things that you can do to help prevent heart disease and stroke:  · Have your blood pressure checked at least every 1-2 years. High blood pressure causes heart disease and increases the risk of stroke.  · If you are 55-79 years old, ask your health care provider if you should take aspirin to prevent a heart attack or a stroke.  · Do not use any tobacco products, including cigarettes, chewing tobacco, or electronic cigarettes. If you need help quitting, ask your health care provider.  · It is important to eat a healthy diet and maintain a healthy weight.    Be sure to include plenty of vegetables, fruits, low-fat dairy products, and lean protein.    Avoid eating foods that are high in solid fats, added sugars, or salt (sodium).  · Get regular exercise. This is one of the most important things that you can do for your health.    Try to exercise for at least 150 minutes each week. The type of exercise that you do should increase your heart rate and make you sweat. This is known as moderate-intensity exercise.    Try to do strengthening exercises at least twice each week. Do these in addition to the moderate-intensity exercise.  · Know your numbers. Ask your health care provider to check your cholesterol and your blood glucose. Continue to have your blood tested as directed by your health care provider.  WHAT SHOULD I KNOW ABOUT CANCER SCREENING?  There are several types of cancer. Take the following steps to reduce your risk and to catch any cancer development as early as  possible.  Breast Cancer  · Practice breast self-awareness.    This means understanding how your breasts normally appear and feel.    It also means doing regular breast self-exams. Let your health care provider know about any changes, no matter how small.  · If you are 40 or older, have a clinician do a breast exam (clinical breast exam or CBE) every year. Depending on your age, family history, and medical history, it may be recommended that you also have a yearly breast X-ray (mammogram).  · If you have a family history of breast cancer, talk with your health care provider about genetic screening.  ·   If you are at high risk for breast cancer, talk with your health care provider about having an MRI and a mammogram every year.  · Breast cancer (BRCA) gene test is recommended for women who have family members with BRCA-related cancers. Results of the assessment will determine the need for genetic counseling and BRCA1 and for BRCA2 testing. BRCA-related cancers include these types:    Breast. This occurs in males or females.    Ovarian.    Tubal. This may also be called fallopian tube cancer.    Cancer of the abdominal or pelvic lining (peritoneal cancer).    Prostate.    Pancreatic.  Cervical, Uterine, and Ovarian Cancer  Your health care provider may recommend that you be screened regularly for cancer of the pelvic organs. These include your ovaries, uterus, and vagina. This screening involves a pelvic exam, which includes checking for microscopic changes to the surface of your cervix (Pap test).  · For women ages 21-65, health care providers may recommend a pelvic exam and a Pap test every three years. For women ages 30-65, they may recommend the Pap test and pelvic exam, combined with testing for human papilloma virus (HPV), every five years. Some types of HPV increase your risk of cervical cancer. Testing for HPV may also be done on women of any age who have unclear Pap test results.  · Other health care providers  may not recommend any screening for nonpregnant women who are considered low risk for pelvic cancer and have no symptoms. Ask your health care provider if a screening pelvic exam is right for you.  · If you have had past treatment for cervical cancer or a condition that could lead to cancer, you need Pap tests and screening for cancer for at least 20 years after your treatment. If Pap tests have been discontinued for you, your risk factors (such as having a new sexual partner) need to be reassessed to determine if you should start having screenings again. Some women have medical problems that increase the chance of getting cervical cancer. In these cases, your health care provider may recommend that you have screening and Pap tests more often.  · If you have a family history of uterine cancer or ovarian cancer, talk with your health care provider about genetic screening.  · If you have vaginal bleeding after reaching menopause, tell your health care provider.  · There are currently no reliable tests available to screen for ovarian cancer.  Lung Cancer  Lung cancer screening is recommended for adults 55-80 years old who are at high risk for lung cancer because of a history of smoking. A yearly low-dose CT scan of the lungs is recommended if you:  · Currently smoke.  · Have a history of at least 30 pack-years of smoking and you currently smoke or have quit within the past 15 years. A pack-year is smoking an average of one pack of cigarettes per day for one year.  Yearly screening should:  · Continue until it has been 15 years since you quit.  · Stop if you develop a health problem that would prevent you from having lung cancer treatment.  Colorectal Cancer  · This type of cancer can be detected and can often be prevented.  · Routine colorectal cancer screening usually begins at age 50 and continues through age 75.  · If you have risk factors for colon cancer, your health care provider may recommend that you be  screened at an earlier age.  ·   wearing long sleeves, pants, a wide-brimmed hat, and sunglasses. WHAT SHOULD I KNOW ABOUT OSTEOPOROSIS? Osteoporosis is a condition in which bone destruction happens more quickly than new bone creation. After menopause, you may be at an increased risk for osteoporosis. To help prevent osteoporosis or the bone fractures that can happen because of osteoporosis, the following is recommended:  If you are 54-18 years old, get at least 1,000 mg of calcium and at least 600 mg of vitamin D per day.  If you are older than age 28 but younger than age 11, get at least 1,200 mg of calcium and at least 600 mg of vitamin D per day.  If you are older than  age 65, get at least 1,200 mg of calcium and at least 800 mg of vitamin D per day. Smoking and excessive alcohol intake increase the risk of osteoporosis. Eat foods that are rich in calcium and vitamin D, and do weight-bearing exercises several times each week as directed by your health care provider. WHAT SHOULD I KNOW ABOUT HOW MENOPAUSE AFFECTS [place]? Depression may occur at any age, but it is more common as you become older. Common symptoms of depression include:  Low or sad mood.  Changes in sleep patterns.  Changes in appetite or eating patterns.  Feeling an overall lack of motivation or enjoyment of activities that you previously enjoyed.  Frequent crying spells. Talk with your health care provider if you think that you are experiencing depression. WHAT SHOULD I KNOW ABOUT IMMUNIZATIONS? It is important that you get and maintain your immunizations. These include:  Tetanus, diphtheria, and pertussis (Tdap) booster vaccine.  Influenza every year before the flu season begins.  Pneumonia vaccine.  Shingles vaccine. Your health care provider may also recommend other immunizations.   This information is not intended to replace advice given to you by your health care provider. Make sure you discuss any questions you have with your health care provider.   Document Released: 11/21/2005 Document Revised: 10/20/2014 Document Reviewed: 06/01/2014 Elsevier Interactive Patient Education 2016 [place]. Pneumococcal Vaccine, Polyvalent solution for injection What is this medicine? PNEUMOCOCCAL VACCINE, POLYVALENT (NEU mo KOK al vak SEEN, pol ee VEY luhnt) is a vaccine to prevent pneumococcus bacteria infection. These bacteria are a major cause of ear infections, Strep throat infections, and serious pneumonia, meningitis, or blood infections worldwide. These vaccines help the body to produce antibodies (protective substances) that help your body defend against these bacteria.  This vaccine is recommended for people 40 years of age and older with health problems. It is also recommended for all adults over 86 years old. This vaccine will not treat an infection. This medicine may be used for other purposes; ask your health care provider or pharmacist if you have questions. What should I tell my health care provider before I take this medicine? They need to know if you have any of these conditions: -bleeding problems -bone marrow or organ transplant -cancer, Hodgkin's disease -fever -infection -immune system problems -low platelet count in the blood -seizures -an unusual or allergic reaction to pneumococcal vaccine, diphtheria toxoid, other vaccines, latex, other medicines, foods, dyes, or preservatives -pregnant or trying to get pregnant -breast-feeding How should I use this medicine? This vaccine is for injection into a muscle or under the skin. It is given by a health care professional. A copy of Vaccine Information Statements will be given before each vaccination. Read this sheet carefully each time. The sheet may change frequently. Talk to your  pediatrician regarding the use of this medicine in children. While this drug may be prescribed for children as young as 34 years of age for selected conditions, precautions do apply. Overdosage: If you think you have taken too much of this medicine contact a poison control center or emergency room at once. NOTE: This medicine is only for you. Do not share this medicine with others. What if I miss a dose? It is important not to miss your dose. Call your doctor or health care professional if you are unable to keep an appointment. What may interact with this medicine? -medicines for cancer chemotherapy -medicines that suppress your immune function -medicines that treat or prevent blood clots like warfarin, enoxaparin, and dalteparin -steroid medicines like prednisone or cortisone This list may not describe all possible  interactions. Give your health care provider a list of all the medicines, herbs, non-prescription drugs, or dietary supplements you use. Also tell them if you smoke, drink alcohol, or use illegal drugs. Some items may interact with your medicine. What should I watch for while using this medicine? Mild fever and pain should go away in 3 days or less. Report any unusual symptoms to your doctor or health care professional. What side effects may I notice from receiving this medicine? Side effects that you should report to your doctor or health care professional as soon as possible: -allergic reactions like skin rash, itching or hives, swelling of the face, lips, or tongue -breathing problems -confused -fever over 102 degrees F -pain, tingling, numbness in the hands or feet -seizures -unusual bleeding or bruising -unusual muscle weakness Side effects that usually do not require medical attention (report to your doctor or health care professional if they continue or are bothersome): -aches and pains -diarrhea -fever of 102 degrees F or less -headache -irritable -loss of appetite -pain, tender at site where injected -trouble sleeping This list may not describe all possible side effects. Call your doctor for medical advice about side effects. You may report side effects to FDA at 1-800-FDA-1088. Where should I keep my medicine? This does not apply. This vaccine is given in a clinic, pharmacy, doctor's office, or other health care setting and will not be stored at home. NOTE: This sheet is a summary. It may not cover all possible information. If you have questions about this medicine, talk to your doctor, pharmacist, or health care provider.    2016, Elsevier/Gold Standard. (2008-05-05 14:32:37) Shingles Vaccine: What You Need to Know WHAT IS SHINGLES?  Shingles is a painful skin rash, often with blisters. It is also called Herpes Zoster or just Zoster.  A shingles rash usually appears on  one side of the face or body and lasts from 2 to 4 weeks. Its main symptom is pain, which can be quite severe. Other symptoms of shingles can include fever, headache, chills, and upset stomach. Very rarely, a shingles infection can lead to pneumonia, hearing problems, blindness, brain inflammation (encephalitis), or death.  For about 1 person in 5, severe pain can continue even after the rash clears up. This is called post-herpetic neuralgia.  Shingles is caused by the Varicella Zoster virus. This is the same virus that causes chickenpox. Only someone who has had a case of chickenpox or rarely, has gotten chickenpox vaccine, can get shingles. The virus stays in your body. It can reappear many years later to cause a case of shingles.  You cannot catch shingles from another person with shingles. However, a person who has never had chickenpox (  or chickenpox vaccine) could get chickenpox from someone with shingles. This is not very common.  Shingles is far more common in people 48 and older than in younger people. It is also more common in people whose immune systems are weakened because of a disease such as cancer or drugs such as steroids or chemotherapy.  At least 1 million people get shingles per year in the [place]. SHINGLES VACCINE  A vaccine for shingles was licensed in 8676. In clinical trials, the vaccine reduced the risk of shingles by 50%. It can also reduce the pain in people who still get shingles after being vaccinated.  A single dose of shingles vaccine is recommended for adults 22 years of age and older. SOME PEOPLE SHOULD NOT GET SHINGLES VACCINE OR SHOULD WAIT A person should not get shingles vaccine if he or she:  Has ever had a life-threatening allergic reaction to gelatin, the antibiotic neomycin, or any other component of shingles vaccine. Tell your caregiver if you have any severe allergies.  Has a weakened immune system because of current:  AIDS or another disease  that affects the immune system.  Treatment with drugs that affect the immune system, such as prolonged use of high-dose steroids.  Cancer treatment, such as radiation or chemotherapy.  Cancer affecting the bone marrow or lymphatic system, such as leukemia or lymphoma.  Is pregnant, or might be pregnant. Women should not become pregnant until at least 4 weeks after getting shingles vaccine. Someone with a minor illness, such as a cold, may be vaccinated. Anyone with a moderate or severe acute illness should usually wait until he or she recovers before getting the vaccine. This includes anyone with a temperature of 101.3 F (38 C) or higher. WHAT ARE THE RISKS FROM SHINGLES VACCINE?  A vaccine, like any medicine, could possibly cause serious problems, such as severe allergic reactions. However, the risk of a vaccine causing serious harm, or death, is extremely small.  No serious problems have been identified with shingles vaccine. Mild Problems  Redness, soreness, swelling, or itching at the site of the injection (about 1 person in 3).  Headache (about 1 person in 44). Like all vaccines, shingles vaccine is being closely monitored for unusual or severe problems. WHAT IF THERE IS A MODERATE OR SEVERE REACTION? What should I look for? Any unusual condition, such as a severe allergic reaction or a high fever. If a severe allergic reaction occurred, it would be within a few minutes to an hour after the shot. Signs of a serious allergic reaction can include difficulty breathing, weakness, hoarseness or wheezing, a fast heartbeat, hives, dizziness, paleness, or swelling of the throat. What should I do?  Call your caregiver, or get the person to a caregiver right away.  Tell the caregiver what happened, the date and time it happened, and when the vaccination was given.  Ask the caregiver to report the reaction by filing a Vaccine Adverse Event Reporting System (VAERS) form. Or, you can file  this report through the VAERS web site at www.vaers.SamedayNews.es or by calling 325-756-5092. VAERS does not provide medical advice. HOW CAN I LEARN MORE?  Ask your caregiver. He or she can give you the vaccine package insert or suggest other sources of information.  Contact the Centers for Disease Control and Prevention (CDC):  Call 5620651096 (1-800-CDC-INFO).  Visit the CDC website at http://hunter.com/ CDC Shingles Vaccine VIS (07/18/08)   This information is not intended to replace advice given to you by your  health care provider. Make sure you discuss any questions you have with your health care provider.   Document Released: 07/27/2006 Document Revised: 02/13/2015 Document Reviewed: 01/19/2013 Elsevier Interactive Patient Education 2016 [employer]. Advance Directive Advance directives are the legal documents that allow you to make choices about your health care and medical treatment if you cannot speak for yourself. Advance directives are a way for you to communicate your wishes to family, friends, and health care providers. The specified people can then convey your decisions about end-of-life care to avoid confusion if you should become unable to communicate. Ideally, the process of discussing and writing advance directives should happen over time rather than making decisions all at once. Advance directives can be modified as your situation changes, and you can change your mind at any time, even after you have signed the advance directives. Each state has its own laws regarding advance directives. You may want to check with your health care provider, attorney, or state representative about the law in your state. Below are some examples of advance directives. LIVING WILL A living will is a set of instructions documenting your wishes about medical care when you cannot care for yourself. It is used if you become:  Terminally ill.  Incapacitated.  Unable to communicate.  Unable  to make decisions. Items to consider in your living will include:  The use or non-use of life-sustaining equipment, such as dialysis machines and breathing machines (ventilators).  A do not resuscitate (DNR) order, which is the instruction not to use cardiopulmonary resuscitation (CPR) if breathing or heartbeat stops.  Tube feeding.  Withholding of food and fluids.  Comfort (palliative) care when the goal becomes comfort rather than a cure.  Organ and tissue donation. A living will does not give instructions about distribution of your money and property if you should pass away. It is advisable to seek the expert advice of a lawyer in drawing up a will regarding your possessions. Decisions about taxes, beneficiaries, and asset distribution will be legally binding. This process can relieve your family and friends of any burdens surrounding disputes or questions that may come up about the allocation of your assets. DO NOT RESUSCITATE (DNR) A do not resuscitate (DNR) order is a request to not have CPR in the event that your heart stops beating or you stop breathing. Unless given other instructions, a health care provider will try to help any patient whose heart has stopped or who has stopped breathing.  HEALTH CARE PROXY AND DURABLE POWER OF ATTORNEY FOR HEALTH CARE A health care proxy is a person (agent) appointed to make medical decisions for you if you cannot. Generally, people choose someone they know well and trust to represent their preferences when they can no longer do so. You should be sure to ask this person for agreement to act as your agent. An agent may have to exercise judgment in the event of a medical decision for which your wishes are not known. The durable power of attorney for health care is the legal document that names your health care proxy. Once written, it should be:  Signed.  Notarized.  Dated.  Copied.  Witnessed.  Incorporated into your medical record. You may  also want to appoint someone to manage your financial affairs if you cannot. This is called a durable power of attorney for finances. It is a separate legal document from the durable power of attorney for health care. You may choose the same person or someone different from  your health care proxy to act as your agent in financial matters.   This information is not intended to replace advice given to you by your health care provider. Make sure you discuss any questions you have with your health care provider.   Document Released: 01/06/2008 Document Revised: 10/04/2013 Document Reviewed: 02/16/2013 Elsevier Interactive Patient Education [employer].

## 2016-04-21 NOTE — Assessment & Plan Note (Signed)
Better on recheck. Continue current regimen. Continue to monitor. Labs checked today.

## 2016-04-22 LAB — VITAMIN D 25 HYDROXY (VIT D DEFICIENCY, FRACTURES): VIT D 25 HYDROXY: 28.2 ng/mL — AB (ref 30.0–100.0)

## 2016-04-22 LAB — MICROSCOPIC EXAMINATION

## 2016-04-22 LAB — CBC WITH DIFFERENTIAL/PLATELET
BASOS ABS: 0 10*3/uL (ref 0.0–0.2)
Basos: 0 %
EOS (ABSOLUTE): 0.1 10*3/uL (ref 0.0–0.4)
EOS: 2 %
Hematocrit: 46.5 % (ref 34.0–46.6)
Hemoglobin: 15.2 g/dL (ref 11.1–15.9)
IMMATURE GRANULOCYTES: 0 %
Immature Grans (Abs): 0 10*3/uL (ref 0.0–0.1)
LYMPHS ABS: 2.8 10*3/uL (ref 0.7–3.1)
Lymphs: 41 %
MCH: 31.1 pg (ref 26.6–33.0)
MCHC: 32.7 g/dL (ref 31.5–35.7)
MCV: 95 fL (ref 79–97)
MONOS ABS: 0.6 10*3/uL (ref 0.1–0.9)
Monocytes: 9 %
NEUTROS PCT: 48 %
Neutrophils Absolute: 3.2 10*3/uL (ref 1.4–7.0)
Platelets: 238 10*3/uL (ref 150–379)
RBC: 4.88 x10E6/uL (ref 3.77–5.28)
RDW: 13.3 % (ref 12.3–15.4)
WBC: 6.7 10*3/uL (ref 3.4–10.8)

## 2016-04-22 LAB — COMPREHENSIVE METABOLIC PANEL
A/G RATIO: 1.6 (ref 1.2–2.2)
ALBUMIN: 4.2 g/dL (ref 3.6–4.8)
ALK PHOS: 65 IU/L (ref 39–117)
ALT: 48 IU/L — ABNORMAL HIGH (ref 0–32)
AST: 32 IU/L (ref 0–40)
BILIRUBIN TOTAL: 0.3 mg/dL (ref 0.0–1.2)
BUN / CREAT RATIO: 23 (ref 12–28)
BUN: 15 mg/dL (ref 8–27)
CHLORIDE: 98 mmol/L (ref 96–106)
CO2: 25 mmol/L (ref 18–29)
Calcium: 9.5 mg/dL (ref 8.7–10.3)
Creatinine, Ser: 0.65 mg/dL (ref 0.57–1.00)
GFR calc Af Amer: 107 mL/min/{1.73_m2} (ref >59)
GFR calc non Af Amer: 93 mL/min/{1.73_m2} (ref >59)
GLUCOSE: 100 mg/dL — AB (ref 65–99)
Globulin, Total: 2.6 g/dL (ref 1.5–4.5)
POTASSIUM: 4.5 mmol/L (ref 3.5–5.2)
SODIUM: 139 mmol/L (ref 134–144)
Total Protein: 6.8 g/dL (ref 6.0–8.5)

## 2016-04-22 LAB — UA/M W/RFLX CULTURE, ROUTINE
BILIRUBIN UA: NEGATIVE
GLUCOSE, UA: NEGATIVE
Ketones, UA: NEGATIVE
Nitrite, UA: NEGATIVE
Protein, UA: NEGATIVE
RBC, UA: NEGATIVE
SPEC GRAV UA: 1.015 (ref 1.005–1.030)
Urobilinogen, Ur: 0.2 mg/dL (ref 0.2–1.0)
pH, UA: 6 (ref 5.0–7.5)

## 2016-04-22 LAB — LIPID PANEL W/O CHOL/HDL RATIO
CHOLESTEROL TOTAL: 253 mg/dL — AB (ref 100–199)
HDL: 49 mg/dL (ref >39)
LDL Calculated: 143 mg/dL — ABNORMAL HIGH (ref 0–99)
Triglycerides: 306 mg/dL — ABNORMAL HIGH (ref 0–149)
VLDL Cholesterol Cal: 61 mg/dL — ABNORMAL HIGH (ref 5–40)

## 2016-04-22 LAB — MICROALBUMIN, URINE WAIVED
CREATININE, URINE WAIVED: 100 mg/dL (ref 10–300)
MICROALB, UR WAIVED: 30 mg/L — AB (ref 0–19)
Microalb/Creat Ratio: <30 mg/g (ref <30)

## 2016-04-22 LAB — TSH: TSH: 2.3 u[IU]/mL (ref 0.450–4.500)

## 2016-04-22 LAB — URINE CULTURE, REFLEX

## 2016-04-23 ENCOUNTER — Encounter: Payer: Self-pay | Admitting: Family Medicine

## 2016-05-07 ENCOUNTER — Encounter: Payer: Medicare Other | Admitting: Family Medicine

## 2016-08-09 ENCOUNTER — Other Ambulatory Visit: Payer: Self-pay | Admitting: Family Medicine

## 2016-10-21 ENCOUNTER — Ambulatory Visit: Payer: Medicare Other | Admitting: Family Medicine

## 2017-01-26 ENCOUNTER — Ambulatory Visit (INDEPENDENT_AMBULATORY_CARE_PROVIDER_SITE_OTHER): Payer: Medicare Other | Admitting: Family Medicine

## 2017-01-26 ENCOUNTER — Encounter: Payer: Self-pay | Admitting: Family Medicine

## 2017-01-26 VITALS — BP 122/82 | HR 86 | Temp 97.9°F | Wt 193.8 lb

## 2017-01-26 DIAGNOSIS — K760 Fatty (change of) liver, not elsewhere classified: Secondary | ICD-10-CM

## 2017-01-26 DIAGNOSIS — E782 Mixed hyperlipidemia: Secondary | ICD-10-CM

## 2017-01-26 DIAGNOSIS — E559 Vitamin D deficiency, unspecified: Secondary | ICD-10-CM | POA: Diagnosis not present

## 2017-01-26 DIAGNOSIS — I10 Essential (primary) hypertension: Secondary | ICD-10-CM | POA: Diagnosis not present

## 2017-01-26 DIAGNOSIS — G8929 Other chronic pain: Secondary | ICD-10-CM

## 2017-01-26 DIAGNOSIS — M25562 Pain in left knee: Secondary | ICD-10-CM | POA: Diagnosis not present

## 2017-01-26 LAB — UA/M W/RFLX CULTURE, ROUTINE
Bilirubin, UA: NEGATIVE
Glucose, UA: NEGATIVE
KETONES UA: NEGATIVE
LEUKOCYTES UA: NEGATIVE
Nitrite, UA: NEGATIVE
PROTEIN UA: NEGATIVE
RBC UA: NEGATIVE
SPEC GRAV UA: 1.015 (ref 1.005–1.030)
Urobilinogen, Ur: 0.2 mg/dL (ref 0.2–1.0)
pH, UA: 5 (ref 5.0–7.5)

## 2017-01-26 LAB — MICROSCOPIC EXAMINATION
Bacteria, UA: NONE SEEN
RBC, UA: NONE SEEN /hpf (ref 0–?)

## 2017-01-26 LAB — MICROALBUMIN, URINE WAIVED
CREATININE, URINE WAIVED: 50 mg/dL (ref 10–300)
MICROALB, UR WAIVED: 10 mg/L (ref 0–19)

## 2017-01-26 MED ORDER — HYDROCHLOROTHIAZIDE 25 MG PO TABS: 25.0000 mg | ORAL_TABLET | Freq: Every day | ORAL | 1 refills | Status: DC

## 2017-01-26 MED ORDER — NAPROXEN 500 MG PO TABS: 500.0000 mg | ORAL_TABLET | Freq: Two times a day (BID) | ORAL | 3 refills | Status: DC

## 2017-01-26 NOTE — Progress Notes (Signed)
BP 122/82   Pulse 86   Temp 97.9 F (36.6 C)   Wt 193 lb 12.8 oz (87.9 kg)   SpO2 95%   BMI 32.25 kg/m    Subjective:    Patient ID: Mary Galvan, female    DOB: 12/13/1950, 66 y.o.   MRN: [id number]  HPI: Mary Galvan is a 66 y.o. female  Chief Complaint  Patient presents with  . Hypertension  . Knee Pain    Left    HYPERTENSION / HYPERLIPIDEMIA Satisfied with current treatment? yes Duration of hypertension: chronic BP monitoring frequency: not checking BP medication side effects: no Past BP meds: hctz Duration of hyperlipidemia: chronic Cholesterol medication side effects: not on anything Cholesterol supplements: none Past cholesterol medications: none Medication compliance: fair compliance Aspirin: no Recent stressors: no Recurrent headaches: no Visual changes: no Palpitations: no Dyspnea: no Chest pain: no Lower extremity edema: no Dizzy/lightheaded: no  KNEE PAIN Duration: several years, but has been significantly worse over the past couple of months.  Involved knee: left primarily R a little bit Mechanism of injury: trauma- turned funny when it first started Location:anterior Onset: sudden Severity: moderate  Quality:  Uncomfortable most of the time, but sharp pain with movement, bending, turning Frequency: constant Radiation: yes- into the shin Aggravating factors: stairs and bending, walking  Alleviating factors: ice, NSAIDs and rest  Status: better than last week, but still aching Treatments attempted: rest, ice, APAP, ibuprofen and aleve  Relief with NSAIDs?:  moderate Weakness with weight bearing or walking: yes Sensation of giving way: no Locking: no Popping: yes Bruising: no Swelling: no Redness: no Paresthesias/decreased sensation: no Fevers: no   Relevant past medical, surgical, family and social history reviewed and updated as indicated. Interim medical history since our last visit reviewed. Allergies and medications  reviewed and updated.  Review of Systems  Constitutional: Negative.   Respiratory: Negative.   Cardiovascular: Negative.   Musculoskeletal: Positive for arthralgias. Negative for back pain, gait problem, joint swelling, myalgias, neck pain and neck stiffness.  Psychiatric/Behavioral: Negative.     Per HPI unless specifically indicated above     Objective:    BP 122/82   Pulse 86   Temp 97.9 F (36.6 C)   Wt 193 lb 12.8 oz (87.9 kg)   SpO2 95%   BMI 32.25 kg/m   Wt Readings from Last 3 Encounters:  01/26/17 193 lb 12.8 oz (87.9 kg)  04/21/16 187 lb (84.8 kg)  03/28/16 189 lb (85.7 kg)    Physical Exam  Constitutional: She is oriented to person, place, and time. She appears well-developed and well-nourished. No distress.  HENT:  Head: Normocephalic and atraumatic.  Right Ear: Hearing normal.  Left Ear: Hearing normal.  Nose: Nose normal.  Eyes: Conjunctivae and lids are normal. Right eye exhibits no discharge. Left eye exhibits no discharge. No scleral icterus.  Cardiovascular: Normal rate, regular rhythm, normal heart sounds and intact distal pulses.  Exam reveals no gallop and no friction rub.   No murmur heard. Pulmonary/Chest: Effort normal and breath sounds normal. No respiratory distress. She has no wheezes. She has no rales. She exhibits no tenderness.  Musculoskeletal: Normal range of motion.  Tenderness along medial joint line of L knee, Negative appley's compression and distraction, negative anterior and posterior drawer, negative mcmurrays  Neurological: She is alert and oriented to person, place, and time.  Skin: Skin is warm, dry and intact. No rash noted. No erythema. No pallor.  Psychiatric: She has  a normal mood and affect. Her speech is normal and behavior is normal. Judgment and thought content normal. Cognition and memory are normal.  Nursing note and vitals reviewed.   Results for orders placed or performed in visit on 01/26/17  HM MAMMOGRAPHY    Result Value Ref Range   HM Mammogram 0-4 Bi-Rad 0-4 Bi-Rad, Self Reported Normal      Assessment & Plan:   Problem List Items Addressed This Visit      Cardiovascular and Mediastinum   Hypertension - Primary    Better on recheck. Continue current regimen. Continue to monitor.       Relevant Medications   hydrochlorothiazide (HYDRODIURIL) 25 MG tablet   Other Relevant Orders   CBC with Differential/Platelet   Comprehensive metabolic panel   Microalbumin, Urine Waived   TSH   UA/M w/rflx Culture, Routine     Digestive   Fatty liver disease, nonalcoholic    Continue current regimen. Continue to monitor.       Relevant Orders   CBC with Differential/Platelet   Comprehensive metabolic panel   TSH   UA/M w/rflx Culture, Routine     Other   Hyperlipidemia    Continue current regimen. Continue to monitor.       Relevant Medications   hydrochlorothiazide (HYDRODIURIL) 25 MG tablet   Other Relevant Orders   CBC with Differential/Platelet   Comprehensive metabolic panel   Lipid Panel w/o Chol/HDL Ratio   TSH   UA/M w/rflx Culture, Routine   Vitamin D deficiency    Rechecking levels today. Await results.       Relevant Orders   CBC with Differential/Platelet   Comprehensive metabolic panel   TSH   UA/M w/rflx Culture, Routine   VITAMIN D 25 Hydroxy (Vit-D Deficiency, Fractures)    Other Visit Diagnoses    Chronic pain of left knee       Likely arthritis. Will hold on x-ray at this time. Will start naproxen. Call with any concerns. Continue icing.   Relevant Medications   naproxen (NAPROSYN) 500 MG tablet       Follow up plan: Return July, for Medicare Wellness.

## 2017-01-26 NOTE — Assessment & Plan Note (Signed)
Rechecking levels today. Await results.  

## 2017-01-26 NOTE — Assessment & Plan Note (Signed)
Continue current regimen. Continue to monitor.

## 2017-01-26 NOTE — Patient Instructions (Addendum)
Knee Exercises Ask your health care provider which exercises are safe for you. Do exercises exactly as told by your health care provider and adjust them as directed. It is normal to feel mild stretching, pulling, tightness, or discomfort as you do these exercises, but you should stop right away if you feel sudden pain or your pain gets worse.Do not begin these exercises until told by your health care provider. STRETCHING AND RANGE OF MOTION EXERCISES  These exercises warm up your muscles and joints and improve the movement and flexibility of your knee. These exercises also help to relieve pain, numbness, and tingling. Exercise A: Knee Extension, Prone  1. Lie on your abdomen on a bed. 2. Place your left / right knee just beyond the edge of the surface so your knee is not on the bed. You can put a towel under your left / right thigh just above your knee for comfort. 3. Relax your leg muscles and allow gravity to straighten your knee. You should feel a stretch behind your left / right knee. 4. Hold this position for __________ seconds. 5. Scoot up so your knee is supported between repetitions. Repeat __________ times. Complete this stretch __________ times a day. Exercise B: Knee Flexion, Active   1. Lie on your back with both knees straight. If this causes back discomfort, bend your left / right knee so your foot is flat on the floor. 2. Slowly slide your left / right heel back toward your buttocks until you feel a gentle stretch in the front of your knee or thigh. 3. Hold this position for __________ seconds. 4. Slowly slide your left / right heel back to the starting position. Repeat __________ times. Complete this exercise __________ times a day. Exercise C: Quadriceps, Prone   1. Lie on your abdomen on a firm surface, such as a bed or padded floor. 2. Bend your left / right knee and hold your ankle. If you cannot reach your ankle or pant leg, loop a belt around your foot and grab the belt  instead. 3. Gently pull your heel toward your buttocks. Your knee should not slide out to the side. You should feel a stretch in the front of your thigh and knee. 4. Hold this position for __________ seconds. Repeat __________ times. Complete this stretch __________ times a day. Exercise D: Hamstring, Supine  1. Lie on your back. 2. Loop a belt or towel over the ball of your left / right foot. The ball of your foot is on the walking surface, right under your toes. 3. Straighten your left / right knee and slowly pull on the belt to raise your leg until you feel a gentle stretch behind your knee.  Do not let your left / right knee bend while you do this.  Keep your other leg flat on the floor. 4. Hold this position for __________ seconds. Repeat __________ times. Complete this stretch __________ times a day. STRENGTHENING EXERCISES  These exercises build strength and endurance in your knee. Endurance is the ability to use your muscles for a long time, even after they get tired. Exercise E: Quadriceps, Isometric   1. Lie on your back with your left / right leg extended and your other knee bent. Put a rolled towel or small pillow under your knee if told by your health care provider. 2. Slowly tense the muscles in the front of your left / right thigh. You should see your kneecap slide up toward your hip or see  increased dimpling just above the knee. This motion will push the back of the knee toward the floor. 3. For __________ seconds, keep the muscle as tight as you can without increasing your pain. 4. Relax the muscles slowly and completely. Repeat __________ times. Complete this exercise __________ times a day. Exercise F: Straight Leg Raises - Quadriceps  1. Lie on your back with your left / right leg extended and your other knee bent. 2. Tense the muscles in the front of your left / right thigh. You should see your kneecap slide up or see increased dimpling just above the knee. Your thigh  may even shake a bit. 3. Keep these muscles tight as you raise your leg 4-6 inches (10-15 cm) off the floor. Do not let your knee bend. 4. Hold this position for __________ seconds. 5. Keep these muscles tense as you lower your leg. 6. Relax your muscles slowly and completely after each repetition. Repeat __________ times. Complete this exercise __________ times a day. Exercise G: Hamstring, Isometric  1. Lie on your back on a firm surface. 2. Bend your left / right knee approximately __________ degrees. 3. Dig your left / right heel into the surface as if you are trying to pull it toward your buttocks. Tighten the muscles in the back of your thighs to dig as hard as you can without increasing any pain. 4. Hold this position for __________ seconds. 5. Release the tension gradually and allow your muscles to relax completely for __________ seconds after each repetition. Repeat __________ times. Complete this exercise __________ times a day. Exercise H: Hamstring Curls   If told by your health care provider, do this exercise while wearing ankle weights. Begin with __________ weights. Then increase the weight by 1 lb (0.5 kg) increments. Do not wear ankle weights that are more than __________. 1. Lie on your abdomen with your legs straight. 2. Bend your left / right knee as far as you can without feeling pain. Keep your hips flat against the floor. 3. Hold this position for __________ seconds. 4. Slowly lower your leg to the starting position. Repeat __________ times. Complete this exercise __________ times a day. Exercise I: Squats (Quadriceps)  1. Stand in front of a table, with your feet and knees pointing straight ahead. You may rest your hands on the table for balance but not for support. 2. Slowly bend your knees and lower your hips like you are going to sit in a chair.  Keep your weight over your heels, not over your toes.  Keep your lower legs upright so they are parallel with the  table legs.  Do not let your hips go lower than your knees.  Do not bend lower than told by your health care provider.  If your knee pain increases, do not bend as low. 3. Hold the squat position for __________ seconds. 4. Slowly push with your legs to return to standing. Do not use your hands to pull yourself to standing. Repeat __________ times. Complete this exercise __________ times a day. Exercise J: Wall Slides (Quadriceps)   1. Lean your back against a smooth wall or door while you walk your feet out 18-24 inches (46-61 cm) from it. 2. Place your feet hip-width apart. 3. Slowly slide down the wall or door until your knees Repeat __________ times. Complete this exercise __________ times a day. 4. Exercise K: Straight Leg Raises - Hip Abductors  1. Lie on your side with your left / right leg   in the top position. Lie so your head, shoulder, knee, and hip line up. You may bend your bottom knee to help you keep your balance. 2. Roll your hips slightly forward so your hips are stacked directly over each other and your left / right knee is facing forward. 3. Leading with your heel, lift your top leg 4-6 inches (10-15 cm). You should feel the muscles in your outer hip lifting.  Do not let your foot drift forward.  Do not let your knee roll toward the ceiling. 4. Hold this position for __________ seconds. 5. Slowly return your leg to the starting position. 6. Let your muscles relax completely after each repetition. Repeat __________ times. Complete this exercise __________ times a day. Exercise L: Straight Leg Raises - Hip Extensors  1. Lie on your abdomen on a firm surface. You can put a pillow under your hips if that is more comfortable. 2. Tense the muscles in your buttocks and lift your left / right leg about 4-6 inches (10-15 cm). Keep your knee straight as you lift your leg. 3. Hold this position for __________ seconds. 4. Slowly lower your leg to the starting position. 5. Let  your leg relax completely after each repetition. Repeat __________ times. Complete this exercise __________ times a day. This information is not intended to replace advice given to you by your health care provider. Make sure you discuss any questions you have with your health care provider. Document Released: 08/13/2005 Document Revised: 06/23/2016 Document Reviewed: 08/05/2015 Elsevier Interactive Patient Education  2017 [employer]  

## 2017-01-26 NOTE — Assessment & Plan Note (Signed)
Better on recheck. Continue current regimen. Continue to monitor.  

## 2017-01-27 LAB — CBC WITH DIFFERENTIAL/PLATELET
Basophils Absolute: 0 10*3/uL (ref 0.0–0.2)
Basos: 1 %
EOS (ABSOLUTE): 0.1 10*3/uL (ref 0.0–0.4)
EOS: 1 %
HEMATOCRIT: 45.9 % (ref 34.0–46.6)
Hemoglobin: 15.7 g/dL (ref 11.1–15.9)
IMMATURE GRANULOCYTES: 0 %
Immature Grans (Abs): 0 10*3/uL (ref 0.0–0.1)
LYMPHS ABS: 2.8 10*3/uL (ref 0.7–3.1)
Lymphs: 46 %
MCH: 31.6 pg (ref 26.6–33.0)
MCHC: 34.2 g/dL (ref 31.5–35.7)
MCV: 92 fL (ref 79–97)
MONOS ABS: 0.5 10*3/uL (ref 0.1–0.9)
Monocytes: 7 %
NEUTROS ABS: 2.8 10*3/uL (ref 1.4–7.0)
Neutrophils: 45 %
PLATELETS: 248 10*3/uL (ref 150–379)
RBC: 4.97 x10E6/uL (ref 3.77–5.28)
RDW: 13.1 % (ref 12.3–15.4)
WBC: 6.2 10*3/uL (ref 3.4–10.8)

## 2017-01-27 LAB — VITAMIN D 25 HYDROXY (VIT D DEFICIENCY, FRACTURES): Vit D, 25-Hydroxy: 32.5 ng/mL (ref 30.0–100.0)

## 2017-01-27 LAB — COMPREHENSIVE METABOLIC PANEL
A/G RATIO: 1.6 (ref 1.2–2.2)
ALT: 40 IU/L — ABNORMAL HIGH (ref 0–32)
AST: 28 IU/L (ref 0–40)
Albumin: 4.5 g/dL (ref 3.6–4.8)
Alkaline Phosphatase: 69 IU/L (ref 39–117)
BILIRUBIN TOTAL: 0.3 mg/dL (ref 0.0–1.2)
BUN/Creatinine Ratio: 28 (ref 12–28)
BUN: 19 mg/dL (ref 8–27)
CALCIUM: 9.7 mg/dL (ref 8.7–10.3)
CHLORIDE: 100 mmol/L (ref 96–106)
CO2: 22 mmol/L (ref 18–29)
Creatinine, Ser: 0.67 mg/dL (ref 0.57–1.00)
GFR calc Af Amer: 105 mL/min/{1.73_m2} (ref >59)
GFR, EST NON AFRICAN AMERICAN: 91 mL/min/{1.73_m2} (ref >59)
GLOBULIN, TOTAL: 2.9 g/dL (ref 1.5–4.5)
Glucose: 165 mg/dL — ABNORMAL HIGH (ref 65–99)
POTASSIUM: 4.3 mmol/L (ref 3.5–5.2)
SODIUM: 142 mmol/L (ref 134–144)
Total Protein: 7.4 g/dL (ref 6.0–8.5)

## 2017-01-27 LAB — LIPID PANEL W/O CHOL/HDL RATIO
Cholesterol, Total: 265 mg/dL — ABNORMAL HIGH (ref 100–199)
HDL: 58 mg/dL (ref >39)
LDL Calculated: 155 mg/dL — ABNORMAL HIGH (ref 0–99)
TRIGLYCERIDES: 262 mg/dL — AB (ref 0–149)
VLDL Cholesterol Cal: 52 mg/dL — ABNORMAL HIGH (ref 5–40)

## 2017-01-27 LAB — TSH: TSH: 1.75 u[IU]/mL (ref 0.450–4.500)

## 2017-03-09 ENCOUNTER — Telehealth: Payer: Self-pay | Admitting: Family Medicine

## 2017-03-09 DIAGNOSIS — M25562 Pain in left knee: Principal | ICD-10-CM

## 2017-03-09 DIAGNOSIS — G8929 Other chronic pain: Secondary | ICD-10-CM

## 2017-03-09 NOTE — Telephone Encounter (Signed)
Pt is taking Naproxen once a day does not feel like her left knee is getting much better & is still having swelling & pain, not limping as bad but still having problems and favoring it and it feels tight not sure if she has fluid in it. She said that Dr. Wynetta Emery told her to let us know if she was still having issues with it and talked about possibly getting x-rays ordered or seeing an orthopedic specialist. please call pt on her cellphone to discuss. [place]

## 2017-03-10 NOTE — Telephone Encounter (Signed)
Patient notified

## 2017-03-10 NOTE — Telephone Encounter (Signed)
Please let her know that I've put in the order for her knee x-ray and we can discuss what we need to do after that. Thanks!

## 2017-03-11 ENCOUNTER — Ambulatory Visit
Admission: RE | Admit: 2017-03-11 | Discharge: 2017-03-11 | Disposition: A | Discharge status: Home / Self Care | Payer: Medicare Other | Source: Ambulatory Visit | Attending: Family Medicine | Admitting: Family Medicine

## 2017-03-11 DIAGNOSIS — G8929 Other chronic pain: Secondary | ICD-10-CM

## 2017-03-11 DIAGNOSIS — M25562 Pain in left knee: Secondary | ICD-10-CM | POA: Diagnosis not present

## 2017-03-11 DIAGNOSIS — M7989 Other specified soft tissue disorders: Secondary | ICD-10-CM | POA: Insufficient documentation

## 2017-03-17 ENCOUNTER — Telehealth: Payer: Self-pay | Admitting: Family Medicine

## 2017-03-17 DIAGNOSIS — R7309 Other abnormal glucose: Secondary | ICD-10-CM

## 2017-03-17 DIAGNOSIS — M179 Osteoarthritis of knee, unspecified: Secondary | ICD-10-CM | POA: Insufficient documentation

## 2017-03-17 DIAGNOSIS — M1710 Unilateral primary osteoarthritis, unspecified knee: Secondary | ICD-10-CM | POA: Insufficient documentation

## 2017-03-17 DIAGNOSIS — M1712 Unilateral primary osteoarthritis, left knee: Secondary | ICD-10-CM

## 2017-03-17 DIAGNOSIS — E782 Mixed hyperlipidemia: Secondary | ICD-10-CM

## 2017-03-17 NOTE — Telephone Encounter (Signed)
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 I've put the referral in for her. Thanks!

## 2017-03-17 NOTE — Telephone Encounter (Signed)
We will check her A1c and cholesterol only. But because her cholesterol was high, I would like her to be fasting. If it doesn't work with the timing of the appointment, we can check it before or after to make it easier.

## 2017-03-17 NOTE — Telephone Encounter (Signed)
Patient notified, orders in for when she comes in to see Tynika Luddy.

## 2017-03-17 NOTE — Telephone Encounter (Signed)
Patient has a question about her up coming visit for her Wellness and CPE. She had several labs drawn, but was not fasting that day. Does she need blood work done again in July? Will insurance pay for it and also does she need to be fasting

## 2017-03-17 NOTE — Telephone Encounter (Signed)
Patient notified

## 2017-04-02 DIAGNOSIS — S83212D Bucket-handle tear of medial meniscus, current injury, left knee, subsequent encounter: Secondary | ICD-10-CM | POA: Diagnosis not present

## 2017-04-02 DIAGNOSIS — S83209A Unspecified tear of unspecified meniscus, current injury, unspecified knee, initial encounter: Secondary | ICD-10-CM | POA: Insufficient documentation

## 2017-04-14 DIAGNOSIS — M25562 Pain in left knee: Secondary | ICD-10-CM | POA: Diagnosis not present

## 2017-04-14 DIAGNOSIS — R609 Edema, unspecified: Secondary | ICD-10-CM | POA: Diagnosis not present

## 2017-04-14 DIAGNOSIS — S838X2S Sprain of other specified parts of left knee, sequela: Secondary | ICD-10-CM | POA: Diagnosis not present

## 2017-05-05 ENCOUNTER — Ambulatory Visit: Payer: Medicare Other | Admitting: Family Medicine

## 2017-05-07 ENCOUNTER — Ambulatory Visit (INDEPENDENT_AMBULATORY_CARE_PROVIDER_SITE_OTHER): Payer: Medicare Other

## 2017-05-07 VITALS — BP 138/88 | HR 72 | Temp 98.0°F | Resp 17 | Ht 65.0 in | Wt 192.7 lb

## 2017-05-07 DIAGNOSIS — Z1231 Encounter for screening mammogram for malignant neoplasm of breast: Secondary | ICD-10-CM

## 2017-05-07 DIAGNOSIS — R7309 Other abnormal glucose: Secondary | ICD-10-CM

## 2017-05-07 DIAGNOSIS — E782 Mixed hyperlipidemia: Secondary | ICD-10-CM

## 2017-05-07 DIAGNOSIS — R739 Hyperglycemia, unspecified: Secondary | ICD-10-CM

## 2017-05-07 DIAGNOSIS — Z0000 Encounter for general adult medical examination without abnormal findings: Secondary | ICD-10-CM

## 2017-05-07 NOTE — Patient Instructions (Addendum)
Mary Galvan , Thank you for taking time to come for your Medicare Wellness Visit. I appreciate your ongoing commitment to your health goals. Please review the following plan we discussed and let me know if I can assist you in the future.   Screening recommendations/referrals: Colonoscopy: Completed 05/10/2015 mammogram: Completed 04/16/2016, due now- call to schedule  Bone Density: Completed 04/14/2016 Recommended yearly ophthalmology/optometry visit for glaucoma screening and checkup Recommended yearly dental visit for hygiene and checkup  Vaccinations: Influenza vaccine: Due 06/2017 Pneumococcal vaccine: up to date Tdap vaccine: up to date Shingles vaccine: due, check with your insurance company for coverage   Advanced directives: Advance directive discussed with you today. I have provided a copy for you to complete at home and have notarized. Once this is complete please bring a copy in to our office so we can scan it into your chart.  Conditions/risks identified: Recommend drinking at least 5-6 glasses of water a day.   Next appointment: Follow up on 05/14/2017 at 2:00pm with Dr.Johnson. Follow up in one year for your annual wellness exam.    Preventive Care 65 Years and Older, Female Preventive care refers to lifestyle choices and visits with your health care provider that can promote health and wellness. What does preventive care include?  A yearly physical exam. This is also called an annual well check.  Dental exams once or twice a year.  Routine eye exams. Ask your health care provider how often you should have your eyes checked.  Personal lifestyle choices, including:  Daily care of your teeth and gums.  Regular physical activity.  Eating a healthy diet.  Avoiding tobacco and drug use.  Limiting alcohol use.  Practicing safe sex.  Taking low-dose aspirin every day.  Taking vitamin and mineral supplements as recommended by your health care provider. What happens  during an annual well check? The services and screenings done by your health care provider during your annual well check will depend on your age, overall health, lifestyle risk factors, and family history of disease. Counseling  Your health care provider may ask you questions about your:  Alcohol use.  Tobacco use.  Drug use.  Emotional well-being.  Home and relationship well-being.  Sexual activity.  Eating habits.  History of falls.  Memory and ability to understand (cognition).  Work and work Statistician.  Reproductive health. Screening  You may have the following tests or measurements:  Height, weight, and BMI.  Blood pressure.  Lipid and cholesterol levels. These may be checked every 5 years, or more frequently if you are over 32 years old.  Skin check.  Lung cancer screening. You may have this screening every year starting at age 50 if you have a 30-pack-year history of smoking and currently smoke or have quit within the past 15 years.  Fecal occult blood test (FOBT) of the stool. You may have this test every year starting at age 37.  Flexible sigmoidoscopy or colonoscopy. You may have a sigmoidoscopy every 5 years or a colonoscopy every 10 years starting at age 70.  Hepatitis C blood test.  Hepatitis B blood test.  Sexually transmitted disease (STD) testing.  Diabetes screening. This is done by checking your blood sugar (glucose) after you have not eaten for a while (fasting). You may have this done every 1-3 years.  Bone density scan. This is done to screen for osteoporosis. You may have this done starting at age 63.  Mammogram. This may be done every 1-2 years. Talk to  your health care provider about how often you should have regular mammograms. Talk with your health care provider about your test results, treatment options, and if necessary, the need for more tests. Vaccines  Your health care provider may recommend certain vaccines, such  as:  Influenza vaccine. This is recommended every year.  Tetanus, diphtheria, and acellular pertussis (Tdap, Td) vaccine. You may need a Td booster every 10 years.  Zoster vaccine. You may need this after age 8.  Pneumococcal 13-valent conjugate (PCV13) vaccine. One dose is recommended after age 67.  Pneumococcal polysaccharide (PPSV23) vaccine. One dose is recommended after age 45. Talk to your health care provider about which screenings and vaccines you need and how often you need them. This information is not intended to replace advice given to you by your health care provider. Make sure you discuss any questions you have with your health care provider. Document Released: 10/26/2015 Document Revised: 06/18/2016 Document Reviewed: 07/31/2015 Elsevier Interactive Patient Education  2017 [place] Prevention in the Home Falls can cause injuries. They can happen to people of all ages. There are many things you can do to make your home safe and to help prevent falls. What can I do on the outside of my home?  Regularly fix the edges of walkways and driveways and fix any cracks.  Remove anything that might make you trip as you walk through a door, such as a raised step or threshold.  Trim any bushes or trees on the path to your home.  Use bright outdoor lighting.  Clear any walking paths of anything that might make someone trip, such as rocks or tools.  Regularly check to see if handrails are loose or broken. Make sure that both sides of any steps have handrails.  Any raised decks and porches should have guardrails on the edges.  Have any leaves, snow, or ice cleared regularly.  Use sand or salt on walking paths during winter.  Clean up any spills in your garage right away. This includes oil or grease spills. What can I do in the bathroom?  Use night lights.  Install grab bars by the toilet and in the tub and shower. Do not use towel bars as grab bars.  Use  non-skid mats or decals in the tub or shower.  If you need to sit down in the shower, use a plastic, non-slip stool.  Keep the floor dry. Clean up any water that spills on the floor as soon as it happens.  Remove soap buildup in the tub or shower regularly.  Attach bath mats securely with double-sided non-slip rug tape.  Do not have throw rugs and other things on the floor that can make you trip. What can I do in the bedroom?  Use night lights.  Make sure that you have a light by your bed that is easy to reach.  Do not use any sheets or blankets that are too big for your bed. They should not hang down onto the floor.  Have a firm chair that has side arms. You can use this for support while you get dressed.  Do not have throw rugs and other things on the floor that can make you trip. What can I do in the kitchen?  Clean up any spills right away.  Avoid walking on wet floors.  Keep items that you use a lot in easy-to-reach places.  If you need to reach something above you, use a strong step stool that has  a grab bar.  Keep electrical cords out of the way.  Do not use floor polish or wax that makes floors slippery. If you must use wax, use non-skid floor wax.  Do not have throw rugs and other things on the floor that can make you trip. What can I do with my stairs?  Do not leave any items on the stairs.  Make sure that there are handrails on both sides of the stairs and use them. Fix handrails that are broken or loose. Make sure that handrails are as long as the stairways.  Check any carpeting to make sure that it is firmly attached to the stairs. Fix any carpet that is loose or worn.  Avoid having throw rugs at the top or bottom of the stairs. If you do have throw rugs, attach them to the floor with carpet tape.  Make sure that you have a light switch at the top of the stairs and the bottom of the stairs. If you do not have them, ask someone to add them for you. What  else can I do to help prevent falls?  Wear shoes that:  Do not have high heels.  Have rubber bottoms.  Are comfortable and fit you well.  Are closed at the toe. Do not wear sandals.  If you use a stepladder:  Make sure that it is fully opened. Do not climb a closed stepladder.  Make sure that both sides of the stepladder are locked into place.  Ask someone to hold it for you, if possible.  Clearly mark and make sure that you can see:  Any grab bars or handrails.  First and last steps.  Where the edge of each step is.  Use tools that help you move around (mobility aids) if they are needed. These include:  Canes.  Walkers.  Scooters.  Crutches.  Turn on the lights when you go into a dark area. Replace any light bulbs as soon as they burn out.  Set up your furniture so you have a clear path. Avoid moving your furniture around.  If any of your floors are uneven, fix them.  If there are any pets around you, be aware of where they are.  Review your medicines with your doctor. Some medicines can make you feel dizzy. This can increase your chance of falling. Ask your doctor what other things that you can do to help prevent falls. This information is not intended to replace advice given to you by your health care provider. Make sure you discuss any questions you have with your health care provider. Document Released: 07/26/2009 Document Revised: 03/06/2016 Document Reviewed: 11/03/2014 Elsevier Interactive Patient Education  2017 [employer].

## 2017-05-07 NOTE — Progress Notes (Signed)
Subjective:   Mary Galvan is a 66 y.o. female who presents for Medicare Annual (Subsequent) preventive examination.  Review of Systems:   Cardiac Risk Factors include: advanced age (>6men, >23 women);dyslipidemia;hypertension;obesity (BMI >30kg/m2)     Objective:     Vitals: BP 138/88 (BP Location: Left Arm, Patient Position: Sitting)   Pulse 72   Temp 98 F (36.7 C)   Resp 17   Ht 5\' 5"  (1.651 m)   Wt 192 lb 11.2 oz (87.4 kg)   BMI 32.07 kg/m   Body mass index is 32.07 kg/m.   Tobacco History  Smoking Status  . Former Smoker  . Packs/day: 1.00  . Years: 30.00  . Types: Cigarettes  . Quit date: 03/13/2000  Smokeless Tobacco  . Never Used     Counseling given: Not Answered   Past Medical History:  Diagnosis Date  . Anxiety   . Arthritis    thumbs  . Depression   . Fatty liver disease, nonalcoholic   . GERD (gastroesophageal reflux disease)   . Heart murmur   . Hyperlipidemia   . Hypertension   . Kidney cysts   . Motion sickness    reading in cars  . PONV (postoperative nausea and vomiting)    slow to wake  . Rosacea   . Seasonal affective disorder ([place])   . Seasonal allergies   . 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 1970's  . Vitreous detachment of left eye July 2012   [facility]   Past Surgical History:  Procedure Laterality Date  . CHOLECYSTECTOMY  2015  . COLONOSCOPY WITH PROPOFOL N/A 05/10/2015   Procedure: COLONOSCOPY WITH PROPOFOL;  Surgeon: Lucilla Lame, MD;  Location: [facility];  Service: Endoscopy;  Laterality: N/A;  WITH BIOPSY-- SIGMOID COLON POLYP  X  4 DESCENDING COLON POLYP  . DILATATION & CURETTAGE/HYSTEROSCOPY WITH MYOSURE N/A 03/28/2016   Procedure: DILATATION & CURETTAGE/HYSTEROSCOPY WITH MYOSURE;  Surgeon: Boykin Nearing, MD;  Location: [facility];  Service: Gynecology;  Laterality: N/A;  . OTHER SURGICAL HISTORY     Uterine Polyp   Family History  Problem Relation Age of Onset  . Cancer  Mother        possible lung?  . Dementia Mother   . Cancer Sister        kidney  . Diabetes Sister   . COPD Sister   . Cancer Paternal Grandfather        stomach and prostate  . Diabetes Sister   . Heart disease Sister   . Hypertension Sister   . COPD Sister   . Stroke Maternal Uncle   . Breast cancer Neg Hx    History  Sexual Activity  . Sexual activity: Not Currently  . Partners: Male    Outpatient Encounter Prescriptions as of 05/07/2017  Medication Sig  . B Complex-Biotin-FA (B-50 COMPLEX PO) Take by mouth.  . Cholecalciferol (VITAMIN D3) 2000 UNITS TABS Take 2,000 Units by mouth daily.   . hydrochlorothiazide (HYDRODIURIL) 25 MG tablet Take 1 tablet (25 mg total) by mouth daily.  . naproxen (NAPROSYN) 500 MG tablet Take 1 tablet (500 mg total) by mouth 2 (two) times daily with a meal.  . Turmeric 500 MG TABS turmeric (bulk)  . ranitidine (ZANTAC) 300 MG tablet Take 1 tablet (300 mg total) by mouth at bedtime. (Patient not taking: Reported on 05/07/2017)   No facility-administered encounter medications on file as of 05/07/2017.  Activities of Daily Living In your present state of health, do you have any difficulty performing the following activities: 05/07/2017 01/26/2017  Hearing? N N  Vision? N N  Difficulty concentrating or making decisions? N N  Walking or climbing stairs? N Y  Dressing or bathing? N N  Doing errands, shopping? N N  Preparing Food and eating ? N -  Using the Toilet? N -  In the past six months, have you accidently leaked urine? N -  Do you have problems with loss of bowel control? N -  Managing your Medications? N -  Managing your Finances? N -  Housekeeping or managing your Housekeeping? N -  Some recent data might be hidden    Patient Care Team: Valerie Roys, DO as PCP - General (Family Medicine) Lucilla Lame, MD as Consulting Physician (Gastroenterology) Schermerhorn, Gwen Her, MD as Referring Physician (Obstetrics and  Gynecology) [facility], [facility], [facility] (Dermatology) Lyla Glassing, MD as Referring Physician (Ophthalmology)    Assessment:     Exercise Activities and Dietary recommendations Current Exercise Habits: The patient does not participate in regular exercise at present, Exercise limited by: None identified  Goals    . Increase water intake          Recommend drinking at least 5-6 glasses of water a day.       Fall Risk Fall Risk  05/07/2017 01/26/2017 04/21/2016 05/03/2015  Falls in the past year? No No No No   Depression Screen PHQ 2/9 Scores 05/07/2017 01/26/2017 04/21/2016 05/03/2015  PHQ - 2 Score 0 0 0 0  PHQ- 9 Score - 2 - -     Cognitive Function     6CIT Screen 05/07/2017  What Year? 0 points  What month? 0 points  What time? 0 points  Count back from 20 0 points  Months in reverse 0 points  Repeat phrase 0 points  Total Score 0    Immunization History  Administered Date(s) Administered  . Influenza-Unspecified 08/18/2014  . Pneumococcal Conjugate-13 05/03/2015  . Pneumococcal Polysaccharide-23 04/21/2016  . Tdap 05/16/2013   Screening Tests Health Maintenance  Topic Date Due  . INFLUENZA VACCINE  05/13/2017  . MAMMOGRAM  04/14/2018  . COLONOSCOPY  05/09/2018  . TETANUS/TDAP  05/17/2023  . DEXA SCAN  Completed  . Hepatitis C Screening  Completed  . PNA vac Low Risk Adult  Completed      Plan:  I have personally reviewed and addressed the Medicare Annual Wellness questionnaire and have noted the following in the patient's chart:  A. Medical and social history B. Use of alcohol, tobacco or illicit drugs  C. Current medications and supplements D. Functional ability and status E.  Nutritional status F.  Physical activity G. Advance directives H. List of other physicians I.  Hospitalizations, surgeries, and ER visits in previous 12 months J.  [place] such as hearing and vision if needed, cognitive and  depression L. Referrals and appointments  In addition, I have reviewed and discussed with patient certain preventive protocols, quality metrics, and best practice recommendations. A written personalized care plan for preventive services as well as general preventive health recommendations were provided to patient.   Signed,  Tyler Aas, LPN Nurse Health Advisor   MD Recommendations: none

## 2017-05-08 LAB — LIPID PANEL W/O CHOL/HDL RATIO
Cholesterol, Total: 241 mg/dL — ABNORMAL HIGH (ref 100–199)
HDL: 53 mg/dL (ref >39)
LDL Calculated: 149 mg/dL — ABNORMAL HIGH (ref 0–99)
Triglycerides: 197 mg/dL — ABNORMAL HIGH (ref 0–149)
VLDL Cholesterol Cal: 39 mg/dL (ref 5–40)

## 2017-05-12 LAB — BAYER DCA HB A1C WAIVED: HB A1C (BAYER DCA - WAIVED): 5.9 % (ref <7.0)

## 2017-05-14 ENCOUNTER — Ambulatory Visit (INDEPENDENT_AMBULATORY_CARE_PROVIDER_SITE_OTHER): Payer: Medicare Other | Admitting: Family Medicine

## 2017-05-14 ENCOUNTER — Encounter: Payer: Self-pay | Admitting: Family Medicine

## 2017-05-14 VITALS — BP 126/78 | HR 77 | Temp 98.6°F | Wt 193.2 lb

## 2017-05-14 DIAGNOSIS — E782 Mixed hyperlipidemia: Secondary | ICD-10-CM | POA: Diagnosis not present

## 2017-05-14 DIAGNOSIS — K760 Fatty (change of) liver, not elsewhere classified: Secondary | ICD-10-CM | POA: Diagnosis not present

## 2017-05-14 DIAGNOSIS — R7301 Impaired fasting glucose: Secondary | ICD-10-CM

## 2017-05-14 DIAGNOSIS — Z0000 Encounter for general adult medical examination without abnormal findings: Secondary | ICD-10-CM | POA: Diagnosis not present

## 2017-05-14 DIAGNOSIS — I10 Essential (primary) hypertension: Secondary | ICD-10-CM | POA: Diagnosis not present

## 2017-05-14 DIAGNOSIS — J301 Allergic rhinitis due to pollen: Secondary | ICD-10-CM | POA: Diagnosis not present

## 2017-05-14 DIAGNOSIS — I700 Atherosclerosis of aorta: Secondary | ICD-10-CM | POA: Diagnosis not present

## 2017-05-14 DIAGNOSIS — I872 Venous insufficiency (chronic) (peripheral): Secondary | ICD-10-CM | POA: Diagnosis not present

## 2017-05-14 DIAGNOSIS — R1310 Dysphagia, unspecified: Secondary | ICD-10-CM | POA: Diagnosis not present

## 2017-05-14 DIAGNOSIS — D229 Melanocytic nevi, unspecified: Secondary | ICD-10-CM | POA: Diagnosis not present

## 2017-05-14 DIAGNOSIS — F419 Anxiety disorder, unspecified: Secondary | ICD-10-CM

## 2017-05-14 DIAGNOSIS — F3342 Major depressive disorder, recurrent, in full remission: Secondary | ICD-10-CM | POA: Diagnosis not present

## 2017-05-14 DIAGNOSIS — E559 Vitamin D deficiency, unspecified: Secondary | ICD-10-CM | POA: Diagnosis not present

## 2017-05-14 DIAGNOSIS — K219 Gastro-esophageal reflux disease without esophagitis: Secondary | ICD-10-CM | POA: Diagnosis not present

## 2017-05-14 MED ORDER — HYDROCHLOROTHIAZIDE 25 MG PO TABS: 25.0000 mg | ORAL_TABLET | Freq: Every day | ORAL | 1 refills | Status: DC

## 2017-05-14 MED ORDER — OMEPRAZOLE 20 MG PO CPDR: 20.0000 mg | DELAYED_RELEASE_CAPSULE | Freq: Every day | ORAL | 3 refills | Status: DC

## 2017-05-14 MED ORDER — RANITIDINE HCL 300 MG PO TABS: 300.0000 mg | ORAL_TABLET | Freq: Every day | ORAL | 1 refills | Status: DC

## 2017-05-14 MED ORDER — NAPROXEN 500 MG PO TABS: 500.0000 mg | ORAL_TABLET | Freq: Two times a day (BID) | ORAL | 3 refills | Status: DC

## 2017-05-14 NOTE — Assessment & Plan Note (Signed)
Will try 1 month of omeprazole, if not better, will get her into GI. Continue to monitor.

## 2017-05-14 NOTE — Assessment & Plan Note (Signed)
Under good control. Continue diet and exercise. Continue to monitor. Call with any concerns.

## 2017-05-14 NOTE — Assessment & Plan Note (Signed)
Under good control. Continue current regimen. Continue to monitor. Call with any concerns. 

## 2017-05-14 NOTE — Assessment & Plan Note (Signed)
Will keep BP and cholesterol under good control, continue to monitor.

## 2017-05-14 NOTE — Assessment & Plan Note (Signed)
Under good control on labs. Continue to work on diet and exercise.

## 2017-05-14 NOTE — Assessment & Plan Note (Signed)
Under good control. Continue to monitor. Call with any concerns.  

## 2017-05-14 NOTE — Patient Instructions (Addendum)
Preventative Services:  Health Risk Assessment and Personalized Prevention Plan: Done today Bone Mass Measurements: Up to date Breast Cancer Screening: Up to date CVD Screening: Up to date Cervical Cancer Screening: N/A Colon Cancer Screening: Up to date Depression Screening: Done today Diabetes Screening: Up to date Glaucoma Screening: See your eye doctor Hepatitis B vaccine: N/A Hepatitis C screening: Up to date HIV Screening: Up to date Flu Vaccine: Done in October Lung cancer Screening: N/A Obesity Screening: Done today Pneumonia Vaccines (2): Up to date STI Screening: N/A   Health Maintenance, Female Adopting a healthy lifestyle and getting preventive care can go a long way to promote health and wellness. Talk with your health care provider about what schedule of regular examinations is right for you. This is a good chance for you to check in with your provider about disease prevention and staying healthy. In between checkups, there are plenty of things you can do on your own. Experts have done a lot of research about which lifestyle changes and preventive measures are most likely to keep you healthy. Ask your health care provider for more information. Weight and diet Eat a healthy diet  Be sure to include plenty of vegetables, fruits, low-fat dairy products, and lean protein.  Do not eat a lot of foods high in solid fats, added sugars, or salt.  Get regular exercise. This is one of the most important things you can do for your health. ? Most adults should exercise for at least 150 minutes each week. The exercise should increase your heart rate and make you sweat (moderate-intensity exercise). ? Most adults should also do strengthening exercises at least twice a week. This is in addition to the moderate-intensity exercise.  Maintain a healthy weight  Body mass index (BMI) is a measurement that can be used to identify possible weight problems. It estimates body fat based on  height and weight. Your health care provider can help determine your BMI and help you achieve or maintain a healthy weight.  For females 18 years of age and older: ? A BMI below 18.5 is considered underweight. ? A BMI of 18.5 to 24.9 is normal. ? A BMI of 25 to 29.9 is considered overweight. ? A BMI of 30 and above is considered obese.  Watch levels of cholesterol and blood lipids  You should start having your blood tested for lipids and cholesterol at 67 years of age, then have this test every 5 years.  You may need to have your cholesterol levels checked more often if: ? Your lipid or cholesterol levels are high. ? You are older than 67 years of age. ? You are at high risk for heart disease.  Cancer screening Lung Cancer  Lung cancer screening is recommended for adults 67-24 years old who are at high risk for lung cancer because of a history of smoking.  A yearly low-dose CT scan of the lungs is recommended for people who: ? Currently smoke. ? Have quit within the past 15 years. ? Have at least a 30-pack-year history of smoking. A pack year is smoking an average of one pack of cigarettes a day for 1 year.  Yearly screening should continue until it has been 15 years since you quit.  Yearly screening should stop if you develop a health problem that would prevent you from having lung cancer treatment.  Breast Cancer  Practice breast self-awareness. This means understanding how your breasts normally appear and feel.  It also means doing regular breast  self-exams. Let your health care provider know about any changes, no matter how small.  If you are in your 67s or 30s, you should have a clinical breast exam (CBE) by a health care provider every 1-3 years as part of a regular health exam.  If you are 67 or older, have a CBE every year. Also consider having a breast X-ray (mammogram) every year.  If you have a family history of breast cancer, talk to your health care provider  about genetic screening.  If you are at high risk for breast cancer, talk to your health care provider about having an MRI and a mammogram every year.  Breast cancer gene (BRCA) assessment is recommended for women who have family members with BRCA-related cancers. BRCA-related cancers include: ? Breast. ? Ovarian. ? Tubal. ? Peritoneal cancers.  Results of the assessment will determine the need for genetic counseling and BRCA1 and BRCA2 testing.  Cervical Cancer Your health care provider may recommend that you be screened regularly for cancer of the pelvic organs (ovaries, uterus, and vagina). This screening involves a pelvic examination, including checking for microscopic changes to the surface of your cervix (Pap test). You may be encouraged to have this screening done every 3 years, beginning at age 67.  For women ages 67-65, health care providers may recommend pelvic exams and Pap testing every 3 years, or they may recommend the Pap and pelvic exam, combined with testing for human papilloma virus (HPV), every 5 years. Some types of HPV increase your risk of cervical cancer. Testing for HPV may also be done on women of any age with unclear Pap test results.  Other health care providers may not recommend any screening for nonpregnant women who are considered low risk for pelvic cancer and who do not have symptoms. Ask your health care provider if a screening pelvic exam is right for you.  If you have had past treatment for cervical cancer or a condition that could lead to cancer, you need Pap tests and screening for cancer for at least 20 years after your treatment. If Pap tests have been discontinued, your risk factors (such as having a new sexual partner) need to be reassessed to determine if screening should resume. Some women have medical problems that increase the chance of getting cervical cancer. In these cases, your health care provider may recommend more frequent screening and Pap  tests.  Colorectal Cancer  This type of cancer can be detected and often prevented.  Routine colorectal cancer screening usually begins at 67 years of age and continues through 67 years of age.  Your health care provider may recommend screening at an earlier age if you have risk factors for colon cancer.  Your health care provider may also recommend using home test kits to check for hidden blood in the stool.  A small camera at the end of a tube can be used to examine your colon directly (sigmoidoscopy or colonoscopy). This is done to check for the earliest forms of colorectal cancer.  Routine screening usually begins at age 13.  Direct examination of the colon should be repeated every 5-10 years through 67 years of age. However, you may need to be screened more often if early forms of precancerous polyps or small growths are found.  Skin Cancer  Check your skin from head to toe regularly.  Tell your health care provider about any new moles or changes in moles, especially if there is a change in a mole's shape or  color.  Also tell your health care provider if you have a mole that is larger than the size of a pencil eraser.  Always use sunscreen. Apply sunscreen liberally and repeatedly throughout the day.  Protect yourself by wearing long sleeves, pants, a wide-brimmed hat, and sunglasses whenever you are outside.  Heart disease, diabetes, and high blood pressure  High blood pressure causes heart disease and increases the risk of stroke. High blood pressure is more likely to develop in: ? People who have blood pressure in the high end of the normal range (130-139/85-89 mm Hg). ? People who are overweight or obese. ? People who are African American.  If you are 61-36 years of age, have your blood pressure checked every 3-5 years. If you are 18 years of age or older, have your blood pressure checked every year. You should have your blood pressure measured twice-once when you are at  a hospital or clinic, and once when you are not at a hospital or clinic. Record the average of the two measurements. To check your blood pressure when you are not at a hospital or clinic, you can use: ? An automated blood pressure machine at a pharmacy. ? A home blood pressure monitor.  If you are between 36 years and 39 years old, ask your health care provider if you should take aspirin to prevent strokes.  Have regular diabetes screenings. This involves taking a blood sample to check your fasting blood sugar level. ? If you are at a normal weight and have a low risk for diabetes, have this test once every three years after 67 years of age. ? If you are overweight and have a high risk for diabetes, consider being tested at a younger age or more often. Preventing infection Hepatitis B  If you have a higher risk for hepatitis B, you should be screened for this virus. You are considered at high risk for hepatitis B if: ? You were born in a country where hepatitis B is common. Ask your health care provider which countries are considered high risk. ? Your parents were born in a high-risk country, and you have not been immunized against hepatitis B (hepatitis B vaccine). ? You have HIV or AIDS. ? You use needles to inject street drugs. ? You live with someone who has hepatitis B. ? You have had sex with someone who has hepatitis B. ? You get hemodialysis treatment. ? You take certain medicines for conditions, including cancer, organ transplantation, and autoimmune conditions.  Hepatitis C  Blood testing is recommended for: ? Everyone born from [birth year] through 1966. ? Anyone with known risk factors for hepatitis C.  Sexually transmitted infections (STIs)  You should be screened for sexually transmitted infections (STIs) including gonorrhea and chlamydia if: ? You are sexually active and are younger than 67 years of age. ? You are older than 67 years of age and your health care provider tells  you that you are at risk for this type of infection. ? Your sexual activity has changed since you were last screened and you are at an increased risk for chlamydia or gonorrhea. Ask your health care provider if you are at risk.  If you do not have HIV, but are at risk, it may be recommended that you take a prescription medicine daily to prevent HIV infection. This is called pre-exposure prophylaxis (PrEP). You are considered at risk if: ? You are sexually active and do not regularly use condoms or know the HIV  status of your partner(s). ? You take drugs by injection. ? You are sexually active with a partner who has HIV.  Talk with your health care provider about whether you are at high risk of being infected with HIV. If you choose to begin PrEP, you should first be tested for HIV. You should then be tested every 3 months for as long as you are taking PrEP. Pregnancy  If you are premenopausal and you may become pregnant, ask your health care provider about preconception counseling.  If you may become pregnant, take 400 to 800 micrograms (mcg) of folic acid every day.  If you want to prevent pregnancy, talk to your health care provider about birth control (contraception). Osteoporosis and menopause  Osteoporosis is a disease in which the bones lose minerals and strength with aging. This can result in serious bone fractures. Your risk for osteoporosis can be identified using a bone density scan.  If you are 32 years of age or older, or if you are at risk for osteoporosis and fractures, ask your health care provider if you should be screened.  Ask your health care provider whether you should take a calcium or vitamin D supplement to lower your risk for osteoporosis.  Menopause may have certain physical symptoms and risks.  Hormone replacement therapy may reduce some of these symptoms and risks. Talk to your health care provider about whether hormone replacement therapy is right for  you. Follow these instructions at home:  Schedule regular health, dental, and eye exams.  Stay current with your immunizations.  Do not use any tobacco products including cigarettes, chewing tobacco, or electronic cigarettes.  If you are pregnant, do not drink alcohol.  If you are breastfeeding, limit how much and how often you drink alcohol.  Limit alcohol intake to no more than 1 drink per day for nonpregnant women. One drink equals 12 ounces of beer, 5 ounces of wine, or 1 ounces of hard liquor.  Do not use street drugs.  Do not share needles.  Ask your health care provider for help if you need support or information about quitting drugs.  Tell your health care provider if you often feel depressed.  Tell your health care provider if you have ever been abused or do not feel safe at home. This information is not intended to replace advice given to you by your health care provider. Make sure you discuss any questions you have with your health care provider. Document Released: 04/14/2011 Document Revised: 03/06/2016 Document Reviewed: 07/03/2015 Elsevier Interactive Patient Education  2018 [place] Maintenance for Postmenopausal Women Menopause is a normal process in which your reproductive ability comes to an end. This process happens gradually over a span of months to years, usually between the ages of 76 and 14. Menopause is complete when you have missed 12 consecutive menstrual periods. It is important to talk with your health care provider about some of the most common conditions that affect postmenopausal women, such as heart disease, cancer, and bone loss (osteoporosis). Adopting a healthy lifestyle and getting preventive care can help to promote your health and wellness. Those actions can also lower your chances of developing some of these common conditions. What should I know about menopause? During menopause, you may experience a number of symptoms, such  as:  Moderate-to-severe hot flashes.  Night sweats.  Decrease in sex drive.  Mood swings.  Headaches.  Tiredness.  Irritability.  Memory problems.  Insomnia.  Choosing to treat or not  to treat menopausal changes is an individual decision that you make with your health care provider. What should I know about hormone replacement therapy and supplements? Hormone therapy products are effective for treating symptoms that are associated with menopause, such as hot flashes and night sweats. Hormone replacement carries certain risks, especially as you become older. If you are thinking about using estrogen or estrogen with progestin treatments, discuss the benefits and risks with your health care provider. What should I know about heart disease and stroke? Heart disease, heart attack, and stroke become more likely as you age. This may be due, in part, to the hormonal changes that your body experiences during menopause. These can affect how your body processes dietary fats, triglycerides, and cholesterol. Heart attack and stroke are both medical emergencies. There are many things that you can do to help prevent heart disease and stroke:  Have your blood pressure checked at least every 1-2 years. High blood pressure causes heart disease and increases the risk of stroke.  If you are 22-24 years old, ask your health care provider if you should take aspirin to prevent a heart attack or a stroke.  Do not use any tobacco products, including cigarettes, chewing tobacco, or electronic cigarettes. If you need help quitting, ask your health care provider.  It is important to eat a healthy diet and maintain a healthy weight. ? Be sure to include plenty of vegetables, fruits, low-fat dairy products, and lean protein. ? Avoid eating foods that are high in solid fats, added sugars, or salt (sodium).  Get regular exercise. This is one of the most important things that you can do for your health. ? Try  to exercise for at least 150 minutes each week. The type of exercise that you do should increase your heart rate and make you sweat. This is known as moderate-intensity exercise. ? Try to do strengthening exercises at least twice each week. Do these in addition to the moderate-intensity exercise.  Know your numbers.Ask your health care provider to check your cholesterol and your blood glucose. Continue to have your blood tested as directed by your health care provider.  What should I know about cancer screening? There are several types of cancer. Take the following steps to reduce your risk and to catch any cancer development as early as possible. Breast Cancer  Practice breast self-awareness. ? This means understanding how your breasts normally appear and feel. ? It also means doing regular breast self-exams. Let your health care provider know about any changes, no matter how small.  If you are 28 or older, have a clinician do a breast exam (clinical breast exam or CBE) every year. Depending on your age, family history, and medical history, it may be recommended that you also have a yearly breast X-ray (mammogram).  If you have a family history of breast cancer, talk with your health care provider about genetic screening.  If you are at high risk for breast cancer, talk with your health care provider about having an MRI and a mammogram every year.  Breast cancer (BRCA) gene test is recommended for women who have family members with BRCA-related cancers. Results of the assessment will determine the need for genetic counseling and BRCA1 and for BRCA2 testing. BRCA-related cancers include these types: ? Breast. This occurs in males or females. ? Ovarian. ? Tubal. This may also be called fallopian tube cancer. ? Cancer of the abdominal or pelvic lining (peritoneal cancer). ? Prostate. ? Pancreatic.  Cervical, Uterine,  and Ovarian Cancer Your health care provider may recommend that you be  screened regularly for cancer of the pelvic organs. These include your ovaries, uterus, and vagina. This screening involves a pelvic exam, which includes checking for microscopic changes to the surface of your cervix (Pap test).  For women ages 21-65, health care providers may recommend a pelvic exam and a Pap test every three years. For women ages 51-65, they may recommend the Pap test and pelvic exam, combined with testing for human papilloma virus (HPV), every five years. Some types of HPV increase your risk of cervical cancer. Testing for HPV may also be done on women of any age who have unclear Pap test results.  Other health care providers may not recommend any screening for nonpregnant women who are considered low risk for pelvic cancer and have no symptoms. Ask your health care provider if a screening pelvic exam is right for you.  If you have had past treatment for cervical cancer or a condition that could lead to cancer, you need Pap tests and screening for cancer for at least 20 years after your treatment. If Pap tests have been discontinued for you, your risk factors (such as having a new sexual partner) need to be reassessed to determine if you should start having screenings again. Some women have medical problems that increase the chance of getting cervical cancer. In these cases, your health care provider may recommend that you have screening and Pap tests more often.  If you have a family history of uterine cancer or ovarian cancer, talk with your health care provider about genetic screening.  If you have vaginal bleeding after reaching menopause, tell your health care provider.  There are currently no reliable tests available to screen for ovarian cancer.  Lung Cancer Lung cancer screening is recommended for adults 75-45 years old who are at high risk for lung cancer because of a history of smoking. A yearly low-dose CT scan of the lungs is recommended if you:  Currently  smoke.  Have a history of at least 30 pack-years of smoking and you currently smoke or have quit within the past 15 years. A pack-year is smoking an average of one pack of cigarettes per day for one year.  Yearly screening should:  Continue until it has been 15 years since you quit.  Stop if you develop a health problem that would prevent you from having lung cancer treatment.  Colorectal Cancer  This type of cancer can be detected and can often be prevented.  Routine colorectal cancer screening usually begins at age 34 and continues through age 71.  If you have risk factors for colon cancer, your health care provider may recommend that you be screened at an earlier age.  If you have a family history of colorectal cancer, talk with your health care provider about genetic screening.  Your health care provider may also recommend using home test kits to check for hidden blood in your stool.  A small camera at the end of a tube can be used to examine your colon directly (sigmoidoscopy or colonoscopy). This is done to check for the earliest forms of colorectal cancer.  Direct examination of the colon should be repeated every 5-10 years until age 33. However, if early forms of precancerous polyps or small growths are found or if you have a family history or genetic risk for colorectal cancer, you may need to be screened more often.  Skin Cancer  Check your skin from  head to toe regularly.  Monitor any moles. Be sure to tell your health care provider: ? About any new moles or changes in moles, especially if there is a change in a mole's shape or color. ? If you have a mole that is larger than the size of a pencil eraser.  If any of your family members has a history of skin cancer, especially at a young age, talk with your health care provider about genetic screening.  Always use sunscreen. Apply sunscreen liberally and repeatedly throughout the day.  Whenever you are outside, protect  yourself by wearing long sleeves, pants, a wide-brimmed hat, and sunglasses.  What should I know about osteoporosis? Osteoporosis is a condition in which bone destruction happens more quickly than new bone creation. After menopause, you may be at an increased risk for osteoporosis. To help prevent osteoporosis or the bone fractures that can happen because of osteoporosis, the following is recommended:  If you are 38-17 years old, get at least 1,000 mg of calcium and at least 600 mg of vitamin D per day.  If you are older than age 53 but younger than age 68, get at least 1,200 mg of calcium and at least 600 mg of vitamin D per day.  If you are older than age 83, get at least 1,200 mg of calcium and at least 800 mg of vitamin D per day.  Smoking and excessive alcohol intake increase the risk of osteoporosis. Eat foods that are rich in calcium and vitamin D, and do weight-bearing exercises several times each week as directed by your health care provider. What should I know about how menopause affects my mental health? Depression may occur at any age, but it is more common as you become older. Common symptoms of depression include:  Low or sad mood.  Changes in sleep patterns.  Changes in appetite or eating patterns.  Feeling an overall lack of motivation or enjoyment of activities that you previously enjoyed.  Frequent crying spells.  Talk with your health care provider if you think that you are experiencing depression. What should I know about immunizations? It is important that you get and maintain your immunizations. These include:  Tetanus, diphtheria, and pertussis (Tdap) booster vaccine.  Influenza every year before the flu season begins.  Pneumonia vaccine.  Shingles vaccine.  Your health care provider may also recommend other immunizations. This information is not intended to replace advice given to you by your health care provider. Make sure you discuss any questions you  have with your health care provider. Document Released: 11/21/2005 Document Revised: 04/18/2016 Document Reviewed: 07/03/2015 Elsevier Interactive Patient Education  2018 [employer].

## 2017-05-14 NOTE — Assessment & Plan Note (Signed)
Stable. Continue current regimen. Continue to monitor. Call with any concerns.  

## 2017-05-14 NOTE — Progress Notes (Signed)
BP 126/78 (BP Location: Left Arm, Patient Position: Sitting, Cuff Size: Large)   Pulse 77   Temp 98.6 F (37 C)   Wt 193 lb 4 oz (87.7 kg)   SpO2 96%   BMI 32.16 kg/m    Subjective:    Patient ID: Mary Galvan, female    DOB: 1951-08-14, 66 y.o.   MRN: [id number]  HPI: Mary Galvan is a 66 y.o. female presenting on 05/14/2017 for comprehensive medical examination. Current medical complaints include:  HYPERTENSION / HYPERLIPIDEMIA Satisfied with current treatment? yes Duration of hypertension: chronic BP monitoring frequency: not checking BP medication side effects: no Past BP meds: HCTZ Duration of hyperlipidemia: chronic Cholesterol medication side effects: not on anything Cholesterol supplements: none Past cholesterol medications: none Medication compliance: excellent compliance Aspirin: no Recent stressors: no Recurrent headaches: no Visual changes: no Palpitations: no Dyspnea: no Chest pain: no Lower extremity edema: no Dizzy/lightheaded: no  DEPRESSION/ANXIETY Mood status: controlled Satisfied with current treatment?: yes Symptom severity: mild  Duration of current treatment : Not on anything Psychotherapy/counseling: no  Depressed mood: no Anxious mood: no Anhedonia: no Significant weight loss or gain: no Insomnia: no  Fatigue: no Feelings of worthlessness or guilt: no Impaired concentration/indecisiveness: no Suicidal ideations: no Hopelessness: no Crying spells: no Depression screen [facility] 2/9 05/14/2017 05/07/2017 01/26/2017 04/21/2016 05/03/2015  Decreased Interest 0 0 0 0 0  Down, Depressed, Hopeless 0 0 0 0 0  PHQ - 2 Score 0 0 0 0 0  Altered sleeping 0 - 0 - -  Tired, decreased energy 0 - 1 - -  Change in appetite 0 - 1 - -  Feeling bad or failure about yourself  0 - 0 - -  Trouble concentrating 0 - 0 - -  Moving slowly or fidgety/restless 0 - 0 - -  Suicidal thoughts 0 - 0 - -  PHQ-9 Score 0 - 2 - -   She currently lives with:  husband Menopausal Symptoms: no  Depression Screen done today and results listed below:  Depression screen [facility] 2/9 05/14/2017 05/07/2017 01/26/2017 04/21/2016 05/03/2015  Decreased Interest 0 0 0 0 0  Down, Depressed, Hopeless 0 0 0 0 0  PHQ - 2 Score 0 0 0 0 0  Altered sleeping 0 - 0 - -  Tired, decreased energy 0 - 1 - -  Change in appetite 0 - 1 - -  Feeling bad or failure about yourself  0 - 0 - -  Trouble concentrating 0 - 0 - -  Moving slowly or fidgety/restless 0 - 0 - -  Suicidal thoughts 0 - 0 - -  PHQ-9 Score 0 - 2 - -    Past Medical History:  Past Medical History:  Diagnosis Date  . Anxiety   . Arthritis    thumbs  . Depression   . Fatty liver disease, nonalcoholic   . GERD (gastroesophageal reflux disease)   . Heart murmur   . Hyperlipidemia   . Hypertension   . Kidney cysts   . Motion sickness    reading in cars  . PONV (postoperative nausea and vomiting)    slow to wake  . Rosacea   . 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 Seasonal allergies   . 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 Vitreous detachment of left eye July 2012   [facility]    Surgical History:  Past Surgical History:  Procedure Laterality Date  .  CHOLECYSTECTOMY  2015  . COLONOSCOPY WITH PROPOFOL N/A 05/10/2015   Procedure: COLONOSCOPY WITH PROPOFOL;  Surgeon: Lucilla Lame, MD;  Location: [facility];  Service: Endoscopy;  Laterality: N/A;  WITH BIOPSY-- SIGMOID COLON POLYP  X  4 DESCENDING COLON POLYP  . DILATATION & CURETTAGE/HYSTEROSCOPY WITH MYOSURE N/A 03/28/2016   Procedure: DILATATION & CURETTAGE/HYSTEROSCOPY WITH MYOSURE;  Surgeon: Boykin Nearing, MD;  Location: [facility];  Service: Gynecology;  Laterality: N/A;  . OTHER SURGICAL HISTORY     Uterine Polyp    Medications:  Current Outpatient Prescriptions on File Prior to Visit  Medication Sig  . B Complex-Biotin-FA (B-50 COMPLEX PO) Take by mouth.  . Cholecalciferol (VITAMIN D3) 2000 UNITS  TABS Take 2,000 Units by mouth daily.   . Turmeric 500 MG TABS turmeric (bulk)   No current facility-administered medications on file prior to visit.     Allergies:  No Known Allergies  Social History:  Social History   Social History  . Marital status: Married    Spouse name: N/A  . Number of children: N/A  . Years of education: N/A   Occupational History  . Not on file.   Social History Main Topics  . Smoking status: Former Smoker    Packs/day: 1.00    Years: 30.00    Types: Cigarettes    Quit date: 03/13/2000  . Smokeless tobacco: Never Used  . Alcohol use 0.6 oz/week    1 Glasses of wine per week     Comment: occasional  . Drug use: No  . Sexual activity: Not Currently    Partners: Male   Other Topics Concern  . Not on file   Social History Narrative  . No narrative on file   History  Smoking Status  . Former Smoker  . Packs/day: 1.00  . Years: 30.00  . Types: Cigarettes  . Quit date: 03/13/2000  Smokeless Tobacco  . Never Used   History  Alcohol Use  . 0.6 oz/week  . 1 Glasses of wine per week    Comment: occasional    Family History:  Family History  Problem Relation Age of Onset  . Cancer Mother        possible lung?  . Dementia Mother   . Cancer Sister        kidney  . Diabetes Sister   . COPD Sister   . Cancer Paternal Grandfather        stomach and prostate  . Diabetes Sister   . Heart disease Sister   . Hypertension Sister   . COPD Sister   . Stroke Maternal Uncle   . Breast cancer Neg Hx     Past medical history, surgical history, medications, allergies, family history and social history reviewed with patient today and changes made to appropriate areas of the chart.   Review of Systems  Constitutional: Negative.   HENT: Negative.   Eyes: Positive for blurred vision. Negative for double vision, photophobia, pain, discharge and redness.  Respiratory: Positive for shortness of breath (with heavy activity). Negative for cough,  hemoptysis, sputum production and wheezing.   Cardiovascular: Negative.   Gastrointestinal: Positive for diarrhea and heartburn. Negative for abdominal pain, blood in stool, constipation, melena, nausea and vomiting.       Some dysphagia with both eating and with larger pills. Has been working on chewing her food well. Not swallowing too much at one time, but it's making her nervous, no choking   Genitourinary: Negative.  Musculoskeletal: Positive for joint pain. Negative for back pain, falls, myalgias and neck pain.  Skin: Negative.        Spot on her arm that she would like to have checked out- would like to see dermatology  Neurological: Negative.   Endo/Heme/Allergies: Positive for environmental allergies and polydipsia (Having a lot of trouble with the heat recently). Does not bruise/bleed easily.  Psychiatric/Behavioral: Negative.     All other ROS negative except what is listed above and in the HPI.      Objective:    BP 126/78 (BP Location: Left Arm, Patient Position: Sitting, Cuff Size: Large)   Pulse 77   Temp 98.6 F (37 C)   Wt 193 lb 4 oz (87.7 kg)   SpO2 96%   BMI 32.16 kg/m   Wt Readings from Last 3 Encounters:  05/14/17 193 lb 4 oz (87.7 kg)  05/07/17 192 lb 11.2 oz (87.4 kg)  01/26/17 193 lb 12.8 oz (87.9 kg)    Physical Exam  Constitutional: She is oriented to person, place, and time. She appears well-developed and well-nourished. No distress.  HENT:  Head: Normocephalic and atraumatic.  Right Ear: Hearing and external ear normal.  Left Ear: Hearing and external ear normal.  Nose: Nose normal.  Mouth/Throat: Oropharynx is clear and moist. No oropharyngeal exudate.  Eyes: Pupils are equal, round, and reactive to light. Conjunctivae, EOM and lids are normal. Right eye exhibits no discharge. Left eye exhibits no discharge. No scleral icterus.  Neck: Normal range of motion. Neck supple. No JVD present. No tracheal deviation present. No thyromegaly present.   Cardiovascular: Normal rate, regular rhythm, normal heart sounds and intact distal pulses.  Exam reveals no gallop and no friction rub.   No murmur heard. Pulmonary/Chest: Effort normal and breath sounds normal. No stridor. No respiratory distress. She has no wheezes. She has no rales. She exhibits no tenderness.  Abdominal: Soft. Bowel sounds are normal. She exhibits no distension and no mass. There is no tenderness. There is no rebound and no guarding.  Musculoskeletal: Normal range of motion. She exhibits no edema, tenderness or deformity.  Lymphadenopathy:    She has no cervical adenopathy.  Neurological: She is alert and oriented to person, place, and time. She has normal reflexes. She displays normal reflexes. No cranial nerve deficit. Coordination normal.  Skin: Skin is warm, dry and intact. No rash noted. She is not diaphoretic. No erythema. No pallor.  Psychiatric: She has a normal mood and affect. Her speech is normal and behavior is normal. Judgment and thought content normal. Cognition and memory are normal.  Nursing note and vitals reviewed.   Results for orders placed or performed in visit on 05/07/17  Bayer DCA Hb A1c Waived  Result Value Ref Range   Bayer DCA Hb A1c Waived 5.9 <7.0 %  Lipid Panel w/o Chol/HDL Ratio  Result Value Ref Range   Cholesterol, Total 241 (H) 100 - 199 mg/dL   Triglycerides 197 (H) 0 - 149 mg/dL   HDL 53 >39 mg/dL   VLDL Cholesterol Cal 39 5 - 40 mg/dL   LDL Calculated 149 (H) 0 - 99 mg/dL      Assessment & Plan:   Problem List Items Addressed This Visit      Cardiovascular and Mediastinum   Hypertension    Under good control. Continue current regimen. Continue to monitor. Call with any concerns.       Relevant Medications   hydrochlorothiazide (HYDRODIURIL) 25 MG tablet  Abdominal aortic atherosclerosis (HCC)    Will keep BP and cholesterol under good control, continue to monitor.       Relevant Medications   hydrochlorothiazide  (HYDRODIURIL) 25 MG tablet   Chronic venous insufficiency    Under good control. Continue current regimen. Continue to monitor. Call with any concerns.       Relevant Medications   hydrochlorothiazide (HYDRODIURIL) 25 MG tablet     Respiratory   Allergic rhinitis    Stable. Continue current regimen. Continue to monitor. Call with any concerns.         Digestive   Fatty liver disease, nonalcoholic    Under good control on labs. Continue to work on diet and exercise.         Endocrine   IFG (impaired fasting glucose)    A1c 5.9- continue diet and exercise. Call with any concerns.         Other   Hyperlipidemia    Under good control. Continue diet and exercise. Continue to monitor. Call with any concerns.       Relevant Medications   hydrochlorothiazide (HYDRODIURIL) 25 MG tablet   Depression    Under good control. Continue to monitor. Call with any concerns.       Anxiety    Under good control. Continue to monitor. Call with any concerns.       Vitamin D deficiency    Stable. Continue current regimen. Continue to monitor. Call with any concerns.       Pill dysphagia    Will try 1 month of omeprazole, if not better, will get her into GI. Continue to monitor.        Other Visit Diagnoses    Routine general medical examination at a health care facility    -  Primary   Vaccines up to date. Screening labs checked last visit. Mammogram up to date. Pap N/A. DEXA up to date. Colonoscopy up to date. Continue diet and exercise.   Gastroesophageal reflux disease without esophagitis       Will try 1 month of omeprazole, if not better, will get her into GI. Continue to monitor.    Relevant Medications   omeprazole (PRILOSEC) 20 MG capsule   ranitidine (ZANTAC) 300 MG tablet   Nevus       Referral to dermatology made today.   Relevant Orders   Ambulatory referral to Dermatology       Follow up plan: Return in about 4 weeks (around 06/11/2017) for Follow up  dysphagia.   LABORATORY TESTING:  - Pap smear: not applicable  IMMUNIZATIONS:   - Tdap: Tetanus vaccination status reviewed: last tetanus booster within 10 years. - Influenza: Up to date - Pneumovax: Up to date - Prevnar: Up to date - Zostavax vaccine: Will get this week at the pharmacy  SCREENING: -Mammogram: Up to date  - Colonoscopy: Up to date  - Bone Density: Up to date   PATIENT COUNSELING:   Advised to take 1 mg of folate supplement per day if capable of pregnancy.   Sexuality: Discussed sexually transmitted diseases, partner selection, use of condoms, avoidance of unintended pregnancy  and contraceptive alternatives.   Advised to avoid cigarette smoking.  I discussed with the patient that most people either abstain from alcohol or drink within safe limits (<=14/week and <=4 drinks/occasion for males, <=7/weeks and <= 3 drinks/occasion for females) and that the risk for alcohol disorders and other health effects rises proportionally with the number of drinks per  week and how often a drinker exceeds daily limits.  Discussed cessation/primary prevention of drug use and availability of treatment for abuse.   Diet: Encouraged to adjust caloric intake to maintain  or achieve ideal body weight, to reduce intake of dietary saturated fat and total fat, to limit sodium intake by avoiding high sodium foods and not adding table salt, and to maintain adequate dietary potassium and calcium preferably from fresh fruits, vegetables, and low-fat dairy products.    stressed the importance of regular exercise  Injury prevention: Discussed safety belts, safety helmets, smoke detector, smoking near bedding or upholstery.   Dental health: Discussed importance of regular tooth brushing, flossing, and dental visits.    NEXT PREVENTATIVE PHYSICAL DUE IN 1 YEAR. Return in about 4 weeks (around 06/11/2017) for Follow up dysphagia.

## 2017-05-14 NOTE — Assessment & Plan Note (Signed)
A1c 5.9- continue diet and exercise. Call with any concerns.

## 2017-06-01 ENCOUNTER — Telehealth: Payer: Self-pay | Admitting: Family Medicine

## 2017-06-01 NOTE — Telephone Encounter (Signed)
FYI: Patient called in regards to still experiencing difficulty with her acid reflux. Patient stated she had been having difficulty swallowing and feels her neck glands may be swollen. Patient is scheduled for 06/02/2017 at 8:30am.

## 2017-06-01 NOTE — Telephone Encounter (Signed)
FYI

## 2017-06-02 ENCOUNTER — Ambulatory Visit (INDEPENDENT_AMBULATORY_CARE_PROVIDER_SITE_OTHER): Payer: Medicare Other | Admitting: Family Medicine

## 2017-06-02 ENCOUNTER — Encounter: Payer: Self-pay | Admitting: Family Medicine

## 2017-06-02 VITALS — BP 143/83 | HR 76 | Temp 98.8°F | Wt 192.0 lb

## 2017-06-02 DIAGNOSIS — R1310 Dysphagia, unspecified: Secondary | ICD-10-CM | POA: Diagnosis not present

## 2017-06-02 NOTE — Telephone Encounter (Signed)
Patient seen 06/02/2017 at 8:30 am for office visit.

## 2017-06-02 NOTE — Progress Notes (Signed)
BP (!) 143/83 (BP Location: Left Arm, Patient Position: Sitting, Cuff Size: Normal)   Pulse 76   Temp 98.8 F (37.1 C)   Wt 192 lb (87.1 kg)   SpO2 94%   BMI 31.95 kg/m    Subjective:    Patient ID: Mary Galvan, female    DOB: [birth year], 66 y.o.   MRN: [id number]  HPI: Mary Galvan is a 66 y.o. female  Chief Complaint  Patient presents with  . Dysphagia    Patient states that over the weekend it got way worse, she states that she had to work to swallow. She felt like her tongue and throat was swollen. Patient was even having difficulity swallowing water, patient states that it was like something was not working right.  It is better today. They are currently remodeling their home.    DYSPHAGIA Duration: chronic with pills, but over the weekend has been going on with food and liquids as well Description of symptom: felt like something was swollen in her neck- no trouble with breathing or talking, Friday night she noticed that even her saliva didn't want to go down, did have some hoarseness and some redness in her throat and the back of her tongue felt swollen Onset: Immediately upon swallowing Location of dysphagia: throat Dysphagia to solids only: no Dysphagia to solids & liquids: yes  Frequency:intermittent  Progressively getting worse: yes Alleviatiating factors: nothing Provoking factors: pillsr, not chewing and certain foods Status: better EGD: no Weight loss: no Sensation of lump in throat: no Heartburn: yes Odynophagia: no Nausea: no Vomiting: no Drooling/nasal regurgitation/food spillage: yes Coughing/choking/dysphonia: no Dysarthria: no Hematemesis: no Regurgitation of undigested food/halitosis: no Chest pain: no  Relevant past medical, surgical, family and social history reviewed and updated as indicated. Interim medical history since our last visit reviewed. Allergies and medications reviewed and updated.  Review of Systems  Constitutional:  Negative.   Respiratory: Negative.   Cardiovascular: Negative.   Gastrointestinal: Negative for abdominal distention, abdominal pain, anal bleeding, blood in stool, constipation, diarrhea, nausea, rectal pain and vomiting.  Psychiatric/Behavioral: Negative.     Per HPI unless specifically indicated above     Objective:    BP (!) 143/83 (BP Location: Left Arm, Patient Position: Sitting, Cuff Size: Normal)   Pulse 76   Temp 98.8 F (37.1 C)   Wt 192 lb (87.1 kg)   SpO2 94%   BMI 31.95 kg/m   Wt Readings from Last 3 Encounters:  06/02/17 192 lb (87.1 kg)  05/14/17 193 lb 4 oz (87.7 kg)  05/07/17 192 lb 11.2 oz (87.4 kg)    Physical Exam  Constitutional: She is oriented to person, place, and time. She appears well-developed and well-nourished. No distress.  HENT:  Head: Normocephalic and atraumatic.  Right Ear: Hearing and external ear normal.  Left Ear: Hearing and external ear normal.  Nose: Nose normal.  Mouth/Throat: Oropharynx is clear and moist. No oropharyngeal exudate.  Eyes: Pupils are equal, round, and reactive to light. Conjunctivae, EOM and lids are normal. Right eye exhibits no discharge. Left eye exhibits no discharge. No scleral icterus.  Neck: Normal range of motion. Neck supple. No JVD present. No tracheal deviation present. No thyromegaly present.  Cardiovascular: Normal rate, regular rhythm, normal heart sounds and intact distal pulses.  Exam reveals no gallop and no friction rub.   No murmur heard. Pulmonary/Chest: Effort normal and breath sounds normal. No stridor. No respiratory distress. She has no wheezes. She has no rales.  She exhibits no tenderness.  Abdominal: Soft. Bowel sounds are normal. She exhibits no distension and no mass. There is no tenderness. There is no rebound and no guarding.  Musculoskeletal: Normal range of motion.  Lymphadenopathy:    She has cervical adenopathy.  Neurological: She is alert and oriented to person, place, and time.    Skin: Skin is warm, dry and intact. No rash noted. She is not diaphoretic. No pallor.  Psychiatric: She has a normal mood and affect. Her speech is normal and behavior is normal. Judgment and thought content normal. Cognition and memory are normal.  Nursing note and vitals reviewed.   Results for orders placed or performed in visit on 05/07/17  Bayer DCA Hb A1c Waived  Result Value Ref Range   Bayer DCA Hb A1c Waived 5.9 <7.0 %  Lipid Panel w/o Chol/HDL Ratio  Result Value Ref Range   Cholesterol, Total 241 (H) 100 - 199 mg/dL   Triglycerides 197 (H) 0 - 149 mg/dL   HDL 53 >39 mg/dL   VLDL Cholesterol Cal 39 5 - 40 mg/dL   LDL Calculated 149 (H) 0 - 99 mg/dL      Assessment & Plan:   Problem List Items Addressed This Visit      Other   Pill dysphagia - Primary    Offered kenalog shot for her tongue swelling- she declined this as she is feeling better. Will get her into see GI for evaluation and possible EGD. Referral placed today. Continue to monitor. Call with any concerns.       Relevant Orders   Ambulatory referral to Gastroenterology       Follow up plan: Return if symptoms worsen or fail to improve.

## 2017-06-02 NOTE — Assessment & Plan Note (Signed)
Offered kenalog shot for her tongue swelling- she declined this as she is feeling better. Will get her into see GI for evaluation and possible EGD. Referral placed today. Continue to monitor. Call with any concerns.

## 2017-06-02 NOTE — Patient Instructions (Addendum)

## 2017-06-18 ENCOUNTER — Ambulatory Visit: Payer: Medicare Other | Admitting: Family Medicine

## 2017-07-02 ENCOUNTER — Ambulatory Visit (INDEPENDENT_AMBULATORY_CARE_PROVIDER_SITE_OTHER): Payer: Medicare Other | Admitting: Gastroenterology

## 2017-07-02 VITALS — BP 144/98 | HR 81 | Temp 98.3°F | Ht 65.0 in | Wt 188.0 lb

## 2017-07-02 DIAGNOSIS — N950 Postmenopausal bleeding: Secondary | ICD-10-CM | POA: Insufficient documentation

## 2017-07-02 DIAGNOSIS — K30 Functional dyspepsia: Secondary | ICD-10-CM

## 2017-07-02 MED ORDER — AMITRIPTYLINE HCL 25 MG PO TABS: 25.0000 mg | ORAL_TABLET | Freq: Every day | ORAL | 1 refills | Status: DC

## 2017-07-02 NOTE — Progress Notes (Signed)
Cephas Darby, MD [address]  [place]  [place], [place] [address]  Main: [phone]  Fax: [phone]    Gastroenterology Consultation  Referring Provider:     Valerie Roys, DO Primary Care Physician:  Valerie Roys, DO Primary Gastroenterologist:  Dr. Cephas Darby Reason for Consultation:   Globus sensation        HPI:   Mary Galvan is a 66 y.o. y/o female referred by Dr. Wynetta Emery, Barb Merino, DO  for consultation & management of difficulty swallowing Trouble swallowing started 2 months ago, sore throat and irritated  Tried prilosec for 52month, without relief Globus sensation, food is ready to go down before she thinks she is ready Difficulty with large pills, drinking water gurgles down slowly Denies any choking or coughing spells Not experiencing difficulty swallowing with solid food She was told that she has acid reflux 47yrs ago and was given prilosec and zantac few yrs ago She has epigastric pain,  She denies heart burn and regurgitation Sometimes bending and doing yard work causes regurgitation No weight loss, LOA, nausea, vomiting She said she was tested for H Pylori on blood work x 2 in last 6-20yrs S/p lap chole in 2015 Currently remodeling her house wondering if her symptoms are related to it Denies sinus problems Denies NSAID use Had anxiety issues in the past but when she is under lot of stress she feels anxious easily Takes vitamin D daily for seasonal affective disorder GI Procedures: colonoscopy 04/2015 by Dr Allen Norris, found 5 polyps Due in 2021  Past Medical History:  Diagnosis Date  . Anxiety   . Arthritis    thumbs  . Depression   . Fatty liver disease, nonalcoholic   . GERD (gastroesophageal reflux disease)   . Heart murmur   . Hyperlipidemia   . Hypertension   . Kidney cysts   . Motion sickness    reading in cars  . PONV (postoperative nausea and vomiting)    slow to wake  . Rosacea   . Seasonal affective disorder  ([place])   . Seasonal allergies   . Seizures ([place])    x1 - after head trauma. 1970's  . Vitreous detachment of left eye July 2012   [facility]    Past Surgical History:  Procedure Laterality Date  . CHOLECYSTECTOMY  2015  . COLONOSCOPY WITH PROPOFOL N/A 05/10/2015   Procedure: COLONOSCOPY WITH PROPOFOL;  Surgeon: Lucilla Lame, MD;  Location: [place];  Service: Endoscopy;  Laterality: N/A;  WITH BIOPSY-- SIGMOID COLON POLYP  X  4 DESCENDING COLON POLYP  . DILATATION & CURETTAGE/HYSTEROSCOPY WITH MYOSURE N/A 03/28/2016   Procedure: DILATATION & CURETTAGE/HYSTEROSCOPY WITH MYOSURE;  Surgeon: Boykin Nearing, MD;  Location: [facility];  Service: Gynecology;  Laterality: N/A;  . OTHER SURGICAL HISTORY     Uterine Polyp    Prior to Admission medications   Medication Sig Start Date End Date Taking? Authorizing Provider  Calcium 500 MG CHEW Chew by mouth.   Yes [provider]  Cholecalciferol (VITAMIN D3) 2000 UNITS TABS Take 2,000 Units by mouth daily.    Yes [provider]  hydrochlorothiazide (HYDRODIURIL) 25 MG tablet Take 1 tablet (25 mg total) by mouth daily. 05/14/17  Yes Johnson, Megan P, DO  naproxen (NAPROSYN) 500 MG tablet Take 1 tablet (500 mg total) by mouth 2 (two) times daily with a meal. 05/14/17  Yes Johnson, Megan P, DO  ranitidine (ZANTAC) 300 MG tablet Take  1 tablet (300 mg total) by mouth at bedtime. 05/14/17  Yes Johnson, Megan P, DO  Turmeric 500 MG TABS turmeric (bulk)   Yes [provider]  B Complex-Biotin-FA (B-50 COMPLEX PO) Take by mouth.    [provider]    Family History  Problem Relation Age of Onset  . Cancer Mother        possible lung?  . Dementia Mother   . Cancer Sister        kidney  . Diabetes Sister   . COPD Sister   . Cancer Paternal Grandfather        stomach and prostate  . Diabetes Sister   . Heart disease Sister   . Hypertension Sister   . COPD Sister   . Stroke Maternal Uncle   .  Breast cancer Neg Hx      Social History  Substance Use Topics  . Smoking status: Former Smoker    Packs/day: 1.00    Years: 30.00    Types: Cigarettes    Quit date: 03/13/2000  . Smokeless tobacco: Never Used  . Alcohol use 0.6 oz/week    1 Glasses of wine per week     Comment: occasional    Allergies as of 07/02/2017  . (No Known Allergies)    Review of Systems:    All systems reviewed and negative except where noted in HPI.   Physical Exam:  BP (!) 144/98   Pulse 81   Temp 98.3 F (36.8 C)   Ht 5\' 5"  (1.651 m)   Wt 188 lb (85.3 kg)   BMI 31.28 kg/m  No LMP recorded. Patient is postmenopausal.  General:   Alert,  Well-developed, well-nourished, pleasant and cooperative in NAD Head:  Normocephalic and atraumatic. Eyes:  Sclera clear, no icterus.   Conjunctiva pink. Ears:  Normal auditory acuity. Nose:  No deformity, discharge, or lesions. Mouth:  No deformity or lesions,oropharynx pink & moist. Neck:  Supple; no masses or thyromegaly. Lungs:  Respirations even and unlabored.  Clear throughout to auscultation.   No wheezes, crackles, or rhonchi. No acute distress. Heart:  Regular rate and rhythm; no murmurs, clicks, rubs, or gallops. Abdomen:  Normal bowel sounds.  No bruits.  Soft, non-tender and non-distended without masses, hepatosplenomegaly or hernias noted.  No guarding or rebound tenderness.   Rectal: Nor performed Msk:  Symmetrical without gross deformities. Good, equal movement & strength bilaterally. Pulses:  Normal pulses noted. Extremities:  No clubbing or edema.  No cyanosis. Neurologic:  Alert and oriented x3;  grossly normal neurologically. Skin:  Intact without significant lesions or rashes. No jaundice. Lymph Nodes:  No significant cervical adenopathy. Psych:  Alert and cooperative. Normal mood and affect.  Imaging Studies: None  Assessment and Plan:   Mary Galvan is a 66 y.o. y/o female with 2 months h/o soreness in her throat, globus  sensation, chronic epigastric pain, H Pylori negative. Her symptoms are mostly functional. Given her age, I recommend EGD to r/o any structural causes. Start amitriptylline 25mg  at bedtime and increase to 50mg  in 2weeks  Follow up in 4weeks   Cephas Darby, MD

## 2017-07-15 ENCOUNTER — Telehealth: Payer: Self-pay

## 2017-07-15 NOTE — Telephone Encounter (Signed)
Contacted patient to see if she was ready to schedule her Endoscopy as discussed during 07/02/17 office visit for dysphagia.  LVM. Thanks [employer]

## 2017-08-04 ENCOUNTER — Other Ambulatory Visit: Payer: Self-pay | Admitting: Gastroenterology

## 2017-08-04 DIAGNOSIS — K30 Functional dyspepsia: Secondary | ICD-10-CM

## 2017-08-26 DIAGNOSIS — L578 Other skin changes due to chronic exposure to nonionizing radiation: Secondary | ICD-10-CM | POA: Diagnosis not present

## 2017-08-26 DIAGNOSIS — L570 Actinic keratosis: Secondary | ICD-10-CM | POA: Diagnosis not present

## 2017-08-26 DIAGNOSIS — D485 Neoplasm of uncertain behavior of skin: Secondary | ICD-10-CM | POA: Diagnosis not present

## 2017-08-26 DIAGNOSIS — D492 Neoplasm of unspecified behavior of bone, soft tissue, and skin: Secondary | ICD-10-CM | POA: Diagnosis not present

## 2017-08-26 DIAGNOSIS — D229 Melanocytic nevi, unspecified: Secondary | ICD-10-CM | POA: Diagnosis not present

## 2017-08-26 DIAGNOSIS — Z85828 Personal history of other malignant neoplasm of skin: Secondary | ICD-10-CM | POA: Diagnosis not present

## 2017-08-26 DIAGNOSIS — L820 Inflamed seborrheic keratosis: Secondary | ICD-10-CM | POA: Diagnosis not present

## 2017-08-27 ENCOUNTER — Other Ambulatory Visit: Payer: Self-pay

## 2017-08-27 ENCOUNTER — Telehealth: Payer: Self-pay | Admitting: Gastroenterology

## 2017-08-27 DIAGNOSIS — K30 Functional dyspepsia: Secondary | ICD-10-CM

## 2017-08-27 MED ORDER — AMITRIPTYLINE HCL 25 MG PO TABS: 25.0000 mg | ORAL_TABLET | Freq: Every day | ORAL | 1 refills | Status: DC

## 2017-08-27 NOTE — Telephone Encounter (Signed)
Pt stated that she is ready to schedule EGD.  She has been scheduled for 09/02/17 at [facility] with Dr. Marius Ditch.  She also stated that she has not increased Amitriptyline to 50mg  as the 25 mg are working just fine for her.  I've sent a rx refill to the pharmacy for her.  By the way, she apologizes for not being able to keep her appt with [place].  She said her husband is having some medical problems and will need to have prostate surgery the week of christmas.  Thanks [employer]

## 2017-08-27 NOTE — Telephone Encounter (Signed)
Needs to schedule an EGD. Having difficulty with a problem with husband. Please call ASAP She needs a rx for Amitriptyline 25 mg [facility] [place]

## 2017-09-01 NOTE — Discharge Instructions (Signed)
General Anesthesia, Adult, Care After °These instructions provide you with information about caring for yourself after your procedure. Your health care provider may also give you more specific instructions. Your treatment has been planned according to current medical practices, but problems sometimes occur. Call your health care provider if you have any problems or questions after your procedure. °What can I expect after the procedure? °After the procedure, it is common to have: °· Vomiting. °· A sore throat. °· Mental slowness. ° °It is common to feel: °· Nauseous. °· Cold or shivery. °· Sleepy. °· Tired. °· Sore or achy, even in parts of your body where you did not have surgery. ° °Follow these instructions at home: °For at least 24 hours after the procedure: °· Do not: °? Participate in activities where you could fall or become injured. °? Drive. °? Use heavy machinery. °? Drink alcohol. °? Take sleeping pills or medicines that cause drowsiness. °? Make important decisions or sign legal documents. °? Take care of children on your own. °· Rest. °Eating and drinking °· If you vomit, drink water, juice, or soup when you can drink without vomiting. °· Drink enough fluid to keep your urine clear or pale yellow. °· Make sure you have little or no nausea before eating solid foods. °· Follow the diet recommended by your health care provider. °General instructions °· Have a responsible adult stay with you until you are awake and alert. °· Return to your normal activities as told by your health care provider. Ask your health care provider what activities are safe for you. °· Take over-the-counter and prescription medicines only as told by your health care provider. °· If you smoke, do not smoke without supervision. °· Keep all follow-up visits as told by your health care provider. This is important. °Contact a health care provider if: °· You continue to have nausea or vomiting at home, and medicines are not helpful. °· You  cannot drink fluids or start eating again. °· You cannot urinate after 8-12 hours. °· You develop a skin rash. °· You have fever. °· You have increasing redness at the site of your procedure. °Get help right away if: °· You have difficulty breathing. °· You have chest pain. °· You have unexpected bleeding. °· You feel that you are having a life-threatening or urgent problem. °This information is not intended to replace advice given to you by your health care provider. Make sure you discuss any questions you have with your health care provider. °Document Released: 01/05/2001 Document Revised: 03/03/2016 Document Reviewed: 09/13/2015 °Elsevier Interactive Patient Education © 2018 Elsevier Inc. ° °

## 2017-09-02 ENCOUNTER — Ambulatory Visit: Payer: Medicare Other | Admitting: Anesthesiology

## 2017-09-02 ENCOUNTER — Ambulatory Visit
Admission: RE | Admit: 2017-09-02 | Discharge: 2017-09-02 | Disposition: A | Discharge status: Home / Self Care | Payer: Medicare Other | Source: Ambulatory Visit | Attending: Gastroenterology | Admitting: Gastroenterology

## 2017-09-02 ENCOUNTER — Encounter
Admission: RE | Disposition: A | Discharge status: Home / Self Care | Payer: Self-pay | Source: Ambulatory Visit | Attending: Gastroenterology

## 2017-09-02 DIAGNOSIS — E785 Hyperlipidemia, unspecified: Secondary | ICD-10-CM | POA: Insufficient documentation

## 2017-09-02 DIAGNOSIS — D1779 Benign lipomatous neoplasm of other sites: Secondary | ICD-10-CM | POA: Diagnosis not present

## 2017-09-02 DIAGNOSIS — K760 Fatty (change of) liver, not elsewhere classified: Secondary | ICD-10-CM | POA: Diagnosis not present

## 2017-09-02 DIAGNOSIS — Z87891 Personal history of nicotine dependence: Secondary | ICD-10-CM | POA: Diagnosis not present

## 2017-09-02 DIAGNOSIS — Z8249 Family history of ischemic heart disease and other diseases of the circulatory system: Secondary | ICD-10-CM | POA: Diagnosis not present

## 2017-09-02 DIAGNOSIS — K5710 Diverticulosis of small intestine without perforation or abscess without bleeding: Secondary | ICD-10-CM | POA: Diagnosis not present

## 2017-09-02 DIAGNOSIS — D175 Benign lipomatous neoplasm of intra-abdominal organs: Secondary | ICD-10-CM

## 2017-09-02 DIAGNOSIS — L719 Rosacea, unspecified: Secondary | ICD-10-CM | POA: Diagnosis not present

## 2017-09-02 DIAGNOSIS — Z79899 Other long term (current) drug therapy: Secondary | ICD-10-CM | POA: Insufficient documentation

## 2017-09-02 DIAGNOSIS — Z800 Family history of malignant neoplasm of digestive organs: Secondary | ICD-10-CM | POA: Diagnosis not present

## 2017-09-02 DIAGNOSIS — F39 Unspecified mood [affective] disorder: Secondary | ICD-10-CM | POA: Insufficient documentation

## 2017-09-02 DIAGNOSIS — R0989 Other specified symptoms and signs involving the circulatory and respiratory systems: Secondary | ICD-10-CM

## 2017-09-02 DIAGNOSIS — I10 Essential (primary) hypertension: Secondary | ICD-10-CM | POA: Diagnosis not present

## 2017-09-02 DIAGNOSIS — R011 Cardiac murmur, unspecified: Secondary | ICD-10-CM | POA: Insufficient documentation

## 2017-09-02 DIAGNOSIS — K2980 Duodenitis without bleeding: Secondary | ICD-10-CM | POA: Insufficient documentation

## 2017-09-02 DIAGNOSIS — I739 Peripheral vascular disease, unspecified: Secondary | ICD-10-CM | POA: Insufficient documentation

## 2017-09-02 DIAGNOSIS — M180 Bilateral primary osteoarthritis of first carpometacarpal joints: Secondary | ICD-10-CM | POA: Insufficient documentation

## 2017-09-02 DIAGNOSIS — K317 Polyp of stomach and duodenum: Secondary | ICD-10-CM | POA: Diagnosis not present

## 2017-09-02 DIAGNOSIS — F419 Anxiety disorder, unspecified: Secondary | ICD-10-CM | POA: Diagnosis not present

## 2017-09-02 DIAGNOSIS — R09A2 Foreign body sensation, throat: Secondary | ICD-10-CM

## 2017-09-02 DIAGNOSIS — F329 Major depressive disorder, single episode, unspecified: Secondary | ICD-10-CM | POA: Diagnosis not present

## 2017-09-02 DIAGNOSIS — K3189 Other diseases of stomach and duodenum: Secondary | ICD-10-CM | POA: Diagnosis not present

## 2017-09-02 DIAGNOSIS — K219 Gastro-esophageal reflux disease without esophagitis: Secondary | ICD-10-CM | POA: Insufficient documentation

## 2017-09-02 DIAGNOSIS — F458 Other somatoform disorders: Secondary | ICD-10-CM | POA: Diagnosis not present

## 2017-09-02 HISTORY — PX: ESOPHAGOGASTRODUODENOSCOPY (EGD) WITH PROPOFOL: SHX5813

## 2017-09-02 SURGERY — ESOPHAGOGASTRODUODENOSCOPY (EGD) WITH PROPOFOL
Anesthesia: General | Wound class: Clean Contaminated

## 2017-09-02 MED ORDER — LACTATED RINGERS IV SOLN: 1000.0000 mL | INTRAVENOUS | Status: DC

## 2017-09-02 MED ORDER — LIDOCAINE HCL (CARDIAC) 20 MG/ML IV SOLN
INTRAVENOUS | Status: DC | PRN
  Administered 2017-09-02: 40 mg via INTRAVENOUS

## 2017-09-02 MED ORDER — PROPOFOL 10 MG/ML IV BOLUS
INTRAVENOUS | Status: DC | PRN
  Administered 2017-09-02 (×2): 20 mg via INTRAVENOUS
  Administered 2017-09-02: 30 mg via INTRAVENOUS
  Administered 2017-09-02 (×8): 20 mg via INTRAVENOUS
  Administered 2017-09-02: 30 mg via INTRAVENOUS
  Administered 2017-09-02: 20 mg via INTRAVENOUS
  Administered 2017-09-02 (×2): 30 mg via INTRAVENOUS
  Administered 2017-09-02: 80 mg via INTRAVENOUS
  Administered 2017-09-02: 30 mg via INTRAVENOUS
  Administered 2017-09-02 (×2): 20 mg via INTRAVENOUS
  Administered 2017-09-02: 30 mg via INTRAVENOUS
  Administered 2017-09-02 (×2): 20 mg via INTRAVENOUS
  Administered 2017-09-02: 40 mg via INTRAVENOUS
  Administered 2017-09-02: 20 mg via INTRAVENOUS

## 2017-09-02 MED ORDER — LACTATED RINGERS IV SOLN
INTRAVENOUS | Status: DC
  Administered 2017-09-02: 11:00:00 via INTRAVENOUS

## 2017-09-02 MED ORDER — ACETAMINOPHEN 325 MG PO TABS: 325.0000 mg | ORAL_TABLET | Freq: Once | ORAL | Status: DC

## 2017-09-02 MED ORDER — ACETAMINOPHEN 160 MG/5ML PO SOLN: 325.0000 mg | Freq: Once | ORAL | Status: DC

## 2017-09-02 MED ORDER — GLYCOPYRROLATE 0.2 MG/ML IJ SOLN
INTRAMUSCULAR | Status: DC | PRN
  Administered 2017-09-02: 0.1 mg via INTRAVENOUS

## 2017-09-02 MED ORDER — STERILE WATER FOR IRRIGATION IR SOLN
Status: DC | PRN
  Administered 2017-09-02: 11:00:00

## 2017-09-02 SURGICAL SUPPLY — 35 items
BALLN DILATOR 10-12 8 (BALLOONS)
BALLN DILATOR 12-15 8 (BALLOONS)
BALLN DILATOR 15-18 8 (BALLOONS)
BALLN DILATOR CRE 0-12 8 (BALLOONS)
BALLN DILATOR ESOPH 8 10 CRE (MISCELLANEOUS) IMPLANT
BALLOON DILATOR 12-15 8 (BALLOONS) IMPLANT
BALLOON DILATOR 15-18 8 (BALLOONS) IMPLANT
BALLOON DILATOR CRE 0-12 8 (BALLOONS) IMPLANT
BLOCK BITE 60FR ADLT L/F GRN (MISCELLANEOUS) ×3 IMPLANT
CANISTER SUCT 1200ML W/VALVE (MISCELLANEOUS) ×3 IMPLANT
CLIP HMST 235XBRD CATH ROT (MISCELLANEOUS) IMPLANT
CLIP RESOLUTION 360 11X235 (MISCELLANEOUS)
ELECT REM PT RETURN 9FT ADLT (ELECTROSURGICAL) ×3
ELECTRODE REM PT RTRN 9FT ADLT (ELECTROSURGICAL) ×1 IMPLANT
FCP ESCP3.2XJMB 240X2.8X (MISCELLANEOUS)
FORCEPS BIOP RAD 4 LRG CAP 4 (CUTTING FORCEPS) ×3 IMPLANT
FORCEPS BIOP RJ4 240 W/NDL (MISCELLANEOUS)
FORCEPS ESCP3.2XJMB 240X2.8X (MISCELLANEOUS) IMPLANT
GOWN CVR UNV OPN BCK APRN NK (MISCELLANEOUS) ×2 IMPLANT
GOWN ISOL THUMB LOOP REG UNIV (MISCELLANEOUS) ×4
HANDLE YANKAUER SUCT BULB TIP (MISCELLANEOUS) ×3 IMPLANT
INJECTOR VARIJECT VIN23 (MISCELLANEOUS) IMPLANT
KIT DEFENDO VALVE AND CONN (KITS) IMPLANT
KIT ENDO PROCEDURE OLY (KITS) ×3 IMPLANT
MARKER SPOT ENDO TATTOO 5ML (MISCELLANEOUS) IMPLANT
RETRIEVER NET PLAT FOOD (MISCELLANEOUS) IMPLANT
SNARE SHORT THROW 13M SML OVAL (MISCELLANEOUS) ×3 IMPLANT
SNARE SHORT THROW 30M LRG OVAL (MISCELLANEOUS) IMPLANT
SNARE SPIRAL (MISCELLANEOUS) ×3 IMPLANT
SPOT EX ENDOSCOPIC TATTOO (MISCELLANEOUS)
SYR INFLATION 60ML (SYRINGE) IMPLANT
TRAP ETRAP POLY (MISCELLANEOUS) ×3 IMPLANT
VARIJECT INJECTOR VIN23 (MISCELLANEOUS)
WATER STERILE IRR 250ML POUR (IV SOLUTION) ×3 IMPLANT
WIRE CRE 18-20MM 8CM F G (MISCELLANEOUS) IMPLANT

## 2017-09-02 NOTE — H&P (Signed)
Cephas Darby, MD [address]  [place]  [place], [place] [address]  Main: [phone]  Fax: [phone] Pager: [phone]  Primary Care Physician:  Valerie Roys, DO Primary Gastroenterologist:  Dr. Cephas Darby  Pre-Procedure History & Physical: HPI:  Mary Galvan is a 66 y.o. female is here for an endoscopy.   Past Medical History:  Diagnosis Date  . Anxiety   . Arthritis    thumbs  . Depression   . Fatty liver disease, nonalcoholic   . GERD (gastroesophageal reflux disease)   . Heart murmur   . Hyperlipidemia   . Hypertension   . Kidney cysts   . Motion sickness    reading in cars  . PONV (postoperative nausea and vomiting)    slow to wake  . Rosacea   . Seasonal affective disorder ([place])   . Seasonal allergies   . Seizures ([place])    x1 - after head trauma. 1970's  . Vitreous detachment of left eye July 2012   [facility]    Past Surgical History:  Procedure Laterality Date  . CHOLECYSTECTOMY  2015  . COLONOSCOPY WITH PROPOFOL N/A 05/10/2015   Procedure: COLONOSCOPY WITH PROPOFOL;  Surgeon: Lucilla Lame, MD;  Location: [facility];  Service: Endoscopy;  Laterality: N/A;  WITH BIOPSY-- SIGMOID COLON POLYP  X  4 DESCENDING COLON POLYP  . DILATATION & CURETTAGE/HYSTEROSCOPY WITH MYOSURE N/A 03/28/2016   Procedure: DILATATION & CURETTAGE/HYSTEROSCOPY WITH MYOSURE;  Surgeon: Boykin Nearing, MD;  Location: [facility];  Service: Gynecology;  Laterality: N/A;  . OTHER SURGICAL HISTORY     Uterine Polyp    Prior to Admission medications   Medication Sig Start Date End Date Taking? Authorizing Provider  amitriptyline (ELAVIL) 25 MG tablet Take 1 tablet (25 mg total) at bedtime by mouth. 08/27/17 09/26/17 Yes Wyatte Dames, Tally Due, MD  B Complex-Biotin-FA (B-50 COMPLEX PO) Take by mouth.   Yes [provider]  Calcium 500 MG CHEW Chew by mouth.   Yes [provider]  Cholecalciferol (VITAMIN D3) 2000 UNITS TABS  Take 2,000 Units by mouth daily.    Yes [provider]  hydrochlorothiazide (HYDRODIURIL) 25 MG tablet Take 1 tablet (25 mg total) by mouth daily. 05/14/17  Yes Johnson, Megan P, DO  omeprazole (PRILOSEC) 20 MG capsule  05/14/17  Yes [provider]  ranitidine (ZANTAC) 300 MG tablet Take 1 tablet (300 mg total) by mouth at bedtime. 05/14/17  Yes Johnson, Megan P, DO  Turmeric 500 MG TABS turmeric (bulk)   Yes [provider]  naproxen (NAPROSYN) 500 MG tablet Take 1 tablet (500 mg total) by mouth 2 (two) times daily with a meal. 05/14/17   Park Liter P, DO    Allergies as of 08/27/2017  . (No Known Allergies)    Family History  Problem Relation Age of Onset  . Cancer Mother        possible lung?  . Dementia Mother   . Cancer Sister        kidney  . Diabetes Sister   . COPD Sister   . Cancer Paternal Grandfather        stomach and prostate  . Diabetes Sister   . Heart disease Sister   . Hypertension Sister   . COPD Sister   . Stroke Maternal Uncle   . Breast cancer Neg Hx     Social History   Socioeconomic History  . Marital status: Married  Spouse name: Not on file  . Number of children: Not on file  . Years of education: Not on file  . Highest education level: Not on file  Social Needs  . Financial resource strain: Not on file  . Food insecurity - worry: Not on file  . Food insecurity - inability: Not on file  . Transportation needs - medical: Not on file  . Transportation needs - non-medical: Not on file  Occupational History  . Not on file  Tobacco Use  . Smoking status: Former Smoker    Packs/day: 1.00    Years: 30.00    Pack years: 30.00    Types: Cigarettes    Last attempt to quit: 03/13/2000    Years since quitting: 17.4  . Smokeless tobacco: Never Used  Substance and Sexual Activity  . Alcohol use: Yes    Alcohol/week: 0.6 oz    Types: 1 Glasses of wine per week    Comment: occasional  . Drug use: No  . Sexual activity:  Not Currently    Partners: Male  Other Topics Concern  . Not on file  Social History Narrative  . Not on file    Review of Systems: See HPI, otherwise negative ROS  Physical Exam: Ht 5\' 5"  (1.651 m)   Wt 180 lb (81.6 kg)   BMI 29.95 kg/m  General:   Alert,  pleasant and cooperative in NAD Head:  Normocephalic and atraumatic. Neck:  Supple; no masses or thyromegaly. Lungs:  Clear throughout to auscultation.    Heart:  Regular rate and rhythm. Abdomen:  Soft, nontender and nondistended. Normal bowel sounds, without guarding, and without rebound.   Neurologic:  Alert and  oriented x4;  grossly normal neurologically.  Impression/Plan: MELANIE OPENSHAW is here for an endoscopy to be performed for globus sensation  Risks, benefits, limitations, and alternatives regarding  endoscopy have been reviewed with the patient.  Questions have been answered.  All parties agreeable.   Sherri Sear, MD  09/02/2017, 10:14 AM

## 2017-09-02 NOTE — Anesthesia Preprocedure Evaluation (Signed)
Anesthesia Evaluation  Patient identified by MRN, date of birth, ID band Patient awake    Reviewed: Allergy & Precautions, H&P , NPO status , Patient's Chart, lab work & pertinent test results  Airway Mallampati: II  TM Distance: >3 FB Neck ROM: full    Dental no notable dental hx.    Pulmonary former smoker,    Pulmonary exam normal breath sounds clear to auscultation       Cardiovascular hypertension, + Peripheral Vascular Disease  Normal cardiovascular exam Rhythm:regular Rate:Normal     Neuro/Psych PSYCHIATRIC DISORDERS    GI/Hepatic GERD  ,  Endo/Other    Renal/GU      Musculoskeletal   Abdominal   Peds  Hematology   Anesthesia Other Findings   Reproductive/Obstetrics                             Anesthesia Physical Anesthesia Plan  ASA: II  Anesthesia Plan: General   Post-op Pain Management:    Induction: Intravenous  PONV Risk Score and Plan: 3 and Propofol infusion  Airway Management Planned: Natural Airway  Additional Equipment:   Intra-op Plan:   Post-operative Plan:   Informed Consent: I have reviewed the patients History and Physical, chart, labs and discussed the procedure including the risks, benefits and alternatives for the proposed anesthesia with the patient or authorized representative who has indicated his/her understanding and acceptance.     Plan Discussed with: CRNA  Anesthesia Plan Comments:         Anesthesia Quick Evaluation

## 2017-09-02 NOTE — Transfer of Care (Signed)
Immediate Anesthesia Transfer of Care Note  Patient: Mary Galvan  Procedure(s) Performed: ESOPHAGOGASTRODUODENOSCOPY (EGD) WITH PROPOFOL (N/A )  Patient Location: PACU  Anesthesia Type: General  Level of Consciousness: awake, alert  and patient cooperative  Airway and Oxygen Therapy: Patient Spontanous Breathing and Patient connected to supplemental oxygen  Post-op Assessment: Post-op Vital signs reviewed, Patient's Cardiovascular Status Stable, Respiratory Function Stable, Patent Airway and No signs of Nausea or vomiting  Post-op Vital Signs: Reviewed and stable  Complications: No apparent anesthesia complications

## 2017-09-02 NOTE — Anesthesia Postprocedure Evaluation (Signed)
Anesthesia Post Note  Patient: Mary Galvan  Procedure(s) Performed: ESOPHAGOGASTRODUODENOSCOPY (EGD) WITH PROPOFOL (N/A )  Patient location during evaluation: PACU Anesthesia Type: General Level of consciousness: awake and alert and oriented Pain management: satisfactory to patient Vital Signs Assessment: post-procedure vital signs reviewed and stable Respiratory status: spontaneous breathing, nonlabored ventilation and respiratory function stable Cardiovascular status: blood pressure returned to baseline and stable Postop Assessment: Adequate PO intake and No signs of nausea or vomiting Anesthetic complications: no    Raliegh Ip

## 2017-09-02 NOTE — Op Note (Signed)
[facility] Gastroenterology Patient Name: Mary Galvan Procedure Date: 09/02/2017 10:58 AM MRN: [id number] Account #: [id number] Date of Birth: Nov 11, 1950 Admit Type: Outpatient Age: 66 Room: [facility] OR ROOM 01 Gender: Female Note Status: Finalized Procedure:            Upper GI endoscopy Indications:          globus sensation Providers:            Lin Landsman MD, MD Referring MD:         Valerie Roys (Referring MD) Medicines:            Monitored Anesthesia Care Complications:        No immediate complications. Estimated blood loss:                        Minimal. Procedure:            Pre-Anesthesia Assessment:                       - Prior to the procedure, a History and Physical was                        performed, and patient medications and allergies were                        reviewed. The patient is competent. The risks and                        benefits of the procedure and the sedation options and                        risks were discussed with the patient. All questions                        were answered and informed consent was obtained.                        Patient identification and proposed procedure were                        verified by the physician, the nurse, the                        anesthesiologist, the anesthetist and the technician in                        the pre-procedure area in the procedure room. Mental                        Status Examination: alert and oriented. Airway                        Examination: normal oropharyngeal airway and neck                        mobility. Respiratory Examination: clear to                        auscultation. CV Examination: normal. Prophylactic  Antibiotics: The patient does not require prophylactic                        antibiotics. Prior Anticoagulants: The patient has                        taken no previous anticoagulant or antiplatelet agents.                        ASA Grade Assessment: II - A patient with mild systemic                        disease. After reviewing the risks and benefits, the                        patient was deemed in satisfactory condition to undergo                        the procedure. The anesthesia plan was to use monitored                        anesthesia care (MAC). Immediately prior to                        administration of medications, the patient was                        re-assessed for adequacy to receive sedatives. The                        heart rate, respiratory rate, oxygen saturations, blood                        pressure, adequacy of pulmonary ventilation, and                        response to care were monitored throughout the                        procedure. The physical status of the patient was                        re-assessed after the procedure.                       After obtaining informed consent, the endoscope was                        passed under direct vision. Throughout the procedure,                        the patient's blood pressure, pulse, and oxygen                        saturations were monitored continuously. The Olympus                        [id number]-HQ190 Endoscope (S#. [id number]) was introduced                        through the  mouth, and advanced to the third part of                        duodenum. The upper GI endoscopy was accomplished                        without difficulty. The patient tolerated the procedure                        well. Findings:      There was a medium-sized lipoma in the second portion of the duodenum.      A small non-bleeding diverticulum was found in the second portion of the       duodenum.      A single 12 mm sessile polyp with no bleeding was found in the second       portion of the duodenum in the sweep. Biopsies were taken with a cold       forceps for histology. Polypectomy was attempted with a hot snare. No       tissue  was resected. Polyp resection was unsuccessful due to the       polypectomy being technically difficult given location of polyp in the       sweep despite several attempts, scope falling back into stomach, it was       hard to secure a safe position to perform polypectomy.      The entire examined stomach was normal.      The cardia and gastric fundus were normal on retroflexion.      The gastroesophageal junction and examined esophagus were normal. Impression:           - Duodenal lipoma.                       - Non-bleeding duodenal diverticulum.                       - A single duodenal polyp. Unsuccessful polyp                        resection. Biopsied.                       - Normal stomach.                       - Normal gastroesophageal junction and esophagus. Recommendation:       - Discharge patient to home.                       - Resume previous diet today.                       - Continue present medications.                       - Await pathology results, if adenoma will refer to [facility]                        GI Procedure Code(s):    --- Professional ---                       [phone], Esophagogastroduodenoscopy, flexible, transoral;  with biopsy, single or multiple Diagnosis Code(s):    --- Professional ---                       D17.5, Benign lipomatous neoplasm of intra-abdominal                        organs                       K31.7, Polyp of stomach and duodenum                       K57.10, Diverticulosis of small intestine without                        perforation or abscess without bleeding CPT copyright 2016 American Medical Association. All rights reserved. The codes documented in this report are preliminary and upon coder review may  be revised to meet current compliance requirements. Dr. Ulyess Mort Lin Landsman MD, MD 09/02/2017 12:00:56 PM This report has been signed electronically. Number of Addenda: 0 Note Initiated On:  09/02/2017 10:58 AM      [facility]

## 2017-09-02 NOTE — Anesthesia Procedure Notes (Signed)
Procedure Name: MAC Date/Time: 09/02/2017 11:03 AM Performed by: Janna Arch, CRNA Pre-anesthesia Checklist: Patient identified, Emergency Drugs available, Suction available and Patient being monitored Patient Re-evaluated:Patient Re-evaluated prior to induction Oxygen Delivery Method: Nasal cannula

## 2017-09-04 ENCOUNTER — Encounter: Payer: Self-pay | Admitting: Gastroenterology

## 2017-09-08 ENCOUNTER — Encounter: Payer: Self-pay | Admitting: Gastroenterology

## 2017-09-23 ENCOUNTER — Other Ambulatory Visit: Payer: Self-pay | Admitting: Family Medicine

## 2017-10-01 ENCOUNTER — Other Ambulatory Visit: Payer: Self-pay | Admitting: Gastroenterology

## 2017-10-20 DIAGNOSIS — C441191 Basal cell carcinoma of skin of left upper eyelid, including canthus: Secondary | ICD-10-CM | POA: Diagnosis not present

## 2017-10-20 DIAGNOSIS — D229 Melanocytic nevi, unspecified: Secondary | ICD-10-CM | POA: Diagnosis not present

## 2017-10-20 DIAGNOSIS — C4491 Basal cell carcinoma of skin, unspecified: Secondary | ICD-10-CM

## 2017-10-20 DIAGNOSIS — D485 Neoplasm of uncertain behavior of skin: Secondary | ICD-10-CM | POA: Diagnosis not present

## 2017-10-20 DIAGNOSIS — L570 Actinic keratosis: Secondary | ICD-10-CM | POA: Diagnosis not present

## 2017-10-20 DIAGNOSIS — L578 Other skin changes due to chronic exposure to nonionizing radiation: Secondary | ICD-10-CM | POA: Diagnosis not present

## 2017-10-20 HISTORY — DX: Basal cell carcinoma of skin, unspecified: C44.91

## 2017-11-02 ENCOUNTER — Telehealth: Payer: Self-pay | Admitting: Gastroenterology

## 2017-11-02 NOTE — Telephone Encounter (Signed)
PATIENT CALLED AN IS TAKING AMITRIPTYLINE 25MG . SHE IS HAVING TO PAY OUT OF POCKET FOR THIS & CALL EACH MONTH FOR A REFILL. THIS MEDICINE IS WORKING GREAT,HOWEVER SHE WOULD LIKE TO KNOW IF THERE IS A GENERIC TO THIS MEDICATION OR ONE SIMILAR FOR HER TO TRY. SHE IS WORKING WITH HER INSURANCE TO TRY AND GET THE MEDICATION COST LOWERED. PLEASE CALL WITH THE DECISION. SHE USES [facility] IN [place]([phone])

## 2017-11-04 ENCOUNTER — Other Ambulatory Visit: Payer: Self-pay | Admitting: Gastroenterology

## 2017-11-04 DIAGNOSIS — K30 Functional dyspepsia: Secondary | ICD-10-CM

## 2017-11-04 NOTE — Telephone Encounter (Signed)
That's surprising, elavil is covered by most of the insurance companies. Alternatives are desipramine or nortryptilline but let's appeal to her insurance company first.   -RV

## 2017-11-04 NOTE — Telephone Encounter (Signed)
Sent to insurance for prior auth.  Pending approval/denial//kdc

## 2017-11-05 ENCOUNTER — Telehealth: Payer: Self-pay

## 2017-11-05 NOTE — Telephone Encounter (Signed)
Amitriptyline 25 mg denied by insurance

## 2017-11-06 ENCOUNTER — Other Ambulatory Visit: Payer: Self-pay

## 2017-11-06 MED ORDER — DESIPRAMINE HCL 25 MG PO TABS: 25.0000 mg | ORAL_TABLET | Freq: Every day | ORAL | 0 refills | Status: DC

## 2017-11-06 NOTE — Telephone Encounter (Signed)
Patient is willing to pay for the amitriptyline//kdc

## 2017-12-07 ENCOUNTER — Other Ambulatory Visit: Payer: Self-pay | Admitting: Gastroenterology

## 2017-12-07 NOTE — Telephone Encounter (Signed)
pt left mvm to get a refill on rx Amatriplane 25 mg called in to [facility]

## 2017-12-09 ENCOUNTER — Other Ambulatory Visit: Payer: Self-pay | Admitting: Gastroenterology

## 2017-12-09 DIAGNOSIS — K30 Functional dyspepsia: Secondary | ICD-10-CM

## 2017-12-09 MED ORDER — AMITRIPTYLINE HCL 25 MG PO TABS: 25.0000 mg | ORAL_TABLET | Freq: Every day | ORAL | 0 refills | Status: DC

## 2017-12-09 NOTE — Telephone Encounter (Signed)
error 

## 2017-12-09 NOTE — Telephone Encounter (Signed)
Dr. Marius Ditch, please ok for me to refill this medication.

## 2018-01-04 ENCOUNTER — Telehealth: Payer: Self-pay | Admitting: Gastroenterology

## 2018-01-04 NOTE — Telephone Encounter (Signed)
*  STAT* If patient is at the pharmacy, call can be transferred to refill team.   1. Which medications need to be refilled? (please list name of each medication and dose if known) Amitriptyline 25 mg  2. Which pharmacy/location (including street and city if local pharmacy) is medication to be sent to? [facility]  3. Do they need a 30 day or 90 day supply? 90 day

## 2018-01-06 ENCOUNTER — Other Ambulatory Visit: Payer: Self-pay

## 2018-01-06 DIAGNOSIS — K30 Functional dyspepsia: Secondary | ICD-10-CM

## 2018-01-06 MED ORDER — AMITRIPTYLINE HCL 25 MG PO TABS: 25.0000 mg | ORAL_TABLET | Freq: Every day | ORAL | 0 refills | Status: DC

## 2018-01-19 DIAGNOSIS — D239 Other benign neoplasm of skin, unspecified: Secondary | ICD-10-CM

## 2018-01-19 DIAGNOSIS — D1801 Hemangioma of skin and subcutaneous tissue: Secondary | ICD-10-CM | POA: Diagnosis not present

## 2018-01-19 DIAGNOSIS — D485 Neoplasm of uncertain behavior of skin: Secondary | ICD-10-CM | POA: Diagnosis not present

## 2018-01-19 DIAGNOSIS — L821 Other seborrheic keratosis: Secondary | ICD-10-CM | POA: Diagnosis not present

## 2018-01-19 DIAGNOSIS — L820 Inflamed seborrheic keratosis: Secondary | ICD-10-CM | POA: Diagnosis not present

## 2018-01-19 DIAGNOSIS — Z85828 Personal history of other malignant neoplasm of skin: Secondary | ICD-10-CM | POA: Diagnosis not present

## 2018-01-19 DIAGNOSIS — D2271 Melanocytic nevi of right lower limb, including hip: Secondary | ICD-10-CM | POA: Diagnosis not present

## 2018-01-19 DIAGNOSIS — L578 Other skin changes due to chronic exposure to nonionizing radiation: Secondary | ICD-10-CM | POA: Diagnosis not present

## 2018-01-19 DIAGNOSIS — L570 Actinic keratosis: Secondary | ICD-10-CM | POA: Diagnosis not present

## 2018-01-19 HISTORY — DX: Other benign neoplasm of skin, unspecified: D23.9

## 2018-02-13 ENCOUNTER — Other Ambulatory Visit: Payer: Self-pay | Admitting: Family Medicine

## 2018-03-15 ENCOUNTER — Encounter: Payer: Self-pay | Admitting: Family Medicine

## 2018-04-07 ENCOUNTER — Other Ambulatory Visit: Payer: Self-pay | Admitting: Gastroenterology

## 2018-04-07 DIAGNOSIS — K30 Functional dyspepsia: Secondary | ICD-10-CM

## 2018-05-04 DIAGNOSIS — L7682 Other postprocedural complications of skin and subcutaneous tissue: Secondary | ICD-10-CM | POA: Diagnosis not present

## 2018-05-04 DIAGNOSIS — D2271 Melanocytic nevi of right lower limb, including hip: Secondary | ICD-10-CM | POA: Diagnosis not present

## 2018-05-11 DIAGNOSIS — D2271 Melanocytic nevi of right lower limb, including hip: Secondary | ICD-10-CM | POA: Diagnosis not present

## 2018-05-11 DIAGNOSIS — L820 Inflamed seborrheic keratosis: Secondary | ICD-10-CM | POA: Diagnosis not present

## 2018-05-12 ENCOUNTER — Ambulatory Visit (INDEPENDENT_AMBULATORY_CARE_PROVIDER_SITE_OTHER): Payer: Medicare Other

## 2018-05-12 VITALS — BP 132/78 | HR 81 | Temp 98.1°F | Resp 17 | Ht 66.0 in | Wt 192.0 lb

## 2018-05-12 DIAGNOSIS — Z0000 Encounter for general adult medical examination without abnormal findings: Secondary | ICD-10-CM

## 2018-05-12 NOTE — Patient Instructions (Addendum)
Mary Galvan , Thank you for taking time to come for yourMedicare Wellness Visit. I appreciate your ongoing commitment to your health goals. Please review the following plan we discussed and let me know if I can assist you in the future.   Screening recommendations/referrals: Colonoscopy: Completed 05/10/2015, due 04/2020 mammogram: Completed 04/16/2016, due now-Please call [phone] to schedule your mammogram.  Bone Density: Completed 04/14/2016  Recommended yearly ophthalmology/optometry visit for glaucoma screening and checkup Recommended yearly dental visit for hygiene and checkup  Vaccinations: Influenza vaccine: Due 06/2018 Pneumococcal vaccine: up to date Tdap vaccine: up to date Shingles vaccine: completed first dose 03/09/2018  Advanced directives: Advance directive discussed with you today. I have provided a copy for you to complete at home and have notarized. Once this is complete please bring a copy in to our office so we can scan it into your chart.  Conditions/risks identified: Recommend drinking at least 6-8 glasses of water a day.   Next appointment: Follow up in one year for your annual wellness exam.     Preventive Care 68 Years and Older, Female Preventive care refers to lifestyle choices and visits with your health care provider that can promote health and wellness. What does preventive care include?  A yearly physical exam. This is also called an annual well check.  Dental exams once or twice a year.  Routine eye exams. Ask your health care provider how often you should have your eyes checked.  Personal lifestyle choices, including:  Daily care of your teeth and gums.  Regular physical activity.  Eating a healthy diet.  Avoiding tobacco and drug use.  Limiting alcohol use.  Practicing safe sex.  Taking low-dose aspirin every day.  Taking vitamin and mineral supplements as recommended by your health care provider. What happens during an annual  well check? The services and screenings done by your health care provider during your annual well check will depend on your age, overall health, lifestyle risk factors, and family history of disease. Counseling  Your health care provider may ask you questions about your:  Alcohol use.  Tobacco use.  Drug use.  Emotional well-being.  Home and relationship well-being.  Sexual activity.  Eating habits.  History of falls.  Memory and ability to understand (cognition).  Work and work Statistician.  Reproductive health. Screening  You may have the following tests or measurements:  Height, weight, and BMI.  Blood pressure.  Lipid and cholesterol levels. These may be checked every 5 years, or more frequently if you are over 33 years old.  Skin check.  Lung cancer screening. You may have this screening every year starting at age 68 if you have a 30-pack-year history of smoking and currently smoke or have quit within the past 15 years.  Fecal occult blood test (FOBT) of the stool. You may have this test every year starting at age 68.  Flexible sigmoidoscopy or colonoscopy. You may have a sigmoidoscopy every 5 years or a colonoscopy every 10 years starting at age 68.  Hepatitis C blood test.  Hepatitis B blood test.  Sexually transmitted disease (STD) testing.  Diabetes screening. This is done by checking your blood sugar (glucose) after you have not eaten for a while (fasting). You may have this done every 1-3 years.  Bone density scan. This is done to screen for osteoporosis. You may have this done starting at age 68.  Mammogram. This may be done every 1-2 years. Talk to your health care provider about how often  you should have regular mammograms. Talk with your health care provider about your test results, treatment options, and if necessary, the need for more tests. Vaccines  Your health care provider may recommend certain vaccines, such as:  Influenza vaccine. This  is recommended every year.  Tetanus, diphtheria, and acellular pertussis (Tdap, Td) vaccine. You may need a Td booster every 10 years.  Zoster vaccine. You may need this after age 68.  Pneumococcal 13-valent conjugate (PCV13) vaccine. One dose is recommended after age 68.  Pneumococcal polysaccharide (PPSV23) vaccine. One dose is recommended after age 68. Talk to your health care provider about which screenings and vaccines you need and how often you need them. This information is not intended to replace advice given to you by your health care provider. Make sure you discuss any questions you have with your health care provider. Document Released: 10/26/2015 Document Revised: 06/18/2016 Document Reviewed: 07/31/2015 Elsevier Interactive Patient Education  2017 [place] Prevention in the Home Falls can cause injuries. They can happen to people of all ages. There are many things you can do to make your home safe and to help prevent falls. What can I do on the outside of my home?  Regularly fix the edges of walkways and driveways and fix any cracks.  Remove anything that might make you trip as you walk through a door, such as a raised step or threshold.  Trim any bushes or trees on the path to your home.  Use bright outdoor lighting.  Clear any walking paths of anything that might make someone trip, such as rocks or tools.  Regularly check to see if handrails are loose or broken. Make sure that both sides of any steps have handrails.  Any raised decks and porches should have guardrails on the edges.  Have any leaves, snow, or ice cleared regularly.  Use sand or salt on walking paths during winter.  Clean up any spills in your garage right away. This includes oil or grease spills. What can I do in the bathroom?  Use night lights.  Install grab bars by the toilet and in the tub and shower. Do not use towel bars as grab bars.  Use non-skid mats or decals in the tub or  shower.  If you need to sit down in the shower, use a plastic, non-slip stool.  Keep the floor dry. Clean up any water that spills on the floor as soon as it happens.  Remove soap buildup in the tub or shower regularly.  Attach bath mats securely with double-sided non-slip rug tape.  Do not have throw rugs and other things on the floor that can make you trip. What can I do in the bedroom?  Use night lights.  Make sure that you have a light by your bed that is easy to reach.  Do not use any sheets or blankets that are too big for your bed. They should not hang down onto the floor.  Have a firm chair that has side arms. You can use this for support while you get dressed.  Do not have throw rugs and other things on the floor that can make you trip. What can I do in the kitchen?  Clean up any spills right away.  Avoid walking on wet floors.  Keep items that you use a lot in easy-to-reach places.  If you need to reach something above you, use a strong step stool that has a grab bar.  Keep electrical cords  out of the way.  Do not use floor polish or wax that makes floors slippery. If you must use wax, use non-skid floor wax.  Do not have throw rugs and other things on the floor that can make you trip. What can I do with my stairs?  Do not leave any items on the stairs.  Make sure that there are handrails on both sides of the stairs and use them. Fix handrails that are broken or loose. Make sure that handrails are as long as the stairways.  Check any carpeting to make sure that it is firmly attached to the stairs. Fix any carpet that is loose or worn.  Avoid having throw rugs at the top or bottom of the stairs. If you do have throw rugs, attach them to the floor with carpet tape.  Make sure that you have a light switch at the top of the stairs and the bottom of the stairs. If you do not have them, ask someone to add them for you. What else can I do to help prevent  falls?  Wear shoes that:  Do not have high heels.  Have rubber bottoms.  Are comfortable and fit you well.  Are closed at the toe. Do not wear sandals.  If you use a stepladder:  Make sure that it is fully opened. Do not climb a closed stepladder.  Make sure that both sides of the stepladder are locked into place.  Ask someone to hold it for you, if possible.  Clearly mark and make sure that you can see:  Any grab bars or handrails.  First and last steps.  Where the edge of each step is.  Use tools that help you move around (mobility aids) if they are needed. These include:  Canes.  Walkers.  Scooters.  Crutches.  Turn on the lights when you go into a dark area. Replace any light bulbs as soon as they burn out.  Set up your furniture so you have a clear path. Avoid moving your furniture around.  If any of your floors are uneven, fix them.  If there are any pets around you, be aware of where they are.  Review your medicines with your doctor. Some medicines can make you feel dizzy. This can increase your chance of falling. Ask your doctor what other things that you can do to help prevent falls. This information is not intended to replace advice given to you by your health care provider. Make sure you discuss any questions you have with your health care provider. Document Released: 07/26/2009 Document Revised: 03/06/2016 Document Reviewed: 11/03/2014 Elsevier Interactive Patient Education  2017 [employer].

## 2018-05-12 NOTE — Progress Notes (Signed)
Subjective:   Mary Galvan is a 67 y.o. female who presents for Medicare Annual (Subsequent) preventive examination.  Review of Systems:   Cardiac Risk Factors include: dyslipidemia;hypertension;advanced age (>34men, >13 women);obesity (BMI >30kg/m2)     Objective:     Vitals: BP 132/78 (BP Location: Right Arm, Cuff Size: Normal)   Pulse 81   Temp 98.1 F (36.7 C) (Temporal)   Resp 17   Ht 5\' 6"  (1.676 m)   Wt 192 lb (87.1 kg)   BMI 30.99 kg/m   Body mass index is 30.99 kg/m.  Advanced Directives 05/12/2018 09/02/2017 05/07/2017 04/21/2016 03/28/2016 03/13/2016 05/10/2015  Does Patient Have a Medical Advance Directive? No No No No No No No  Does patient want to make changes to medical advance directive? - - - - - - Yes - information given  Would patient like information on creating a medical advance directive? Yes (MAU/Ambulatory/Procedural Areas - Information given) No - Patient declined Yes (MAU/Ambulatory/Procedural Areas - Information given) Yes - Educational materials given - - -    Tobacco Social History   Tobacco Use  Smoking Status Former Smoker  . Packs/day: 1.00  . Years: 30.00  . Pack years: 30.00  . Types: Cigarettes  . Last attempt to quit: 03/13/2000  . Years since quitting: 18.1  Smokeless Tobacco Never Used     Counseling given: Not Answered   Clinical Intake:                       Past Medical History:  Diagnosis Date  . Anxiety   . Arthritis    thumbs  . Depression   . Fatty liver disease, nonalcoholic   . GERD (gastroesophageal reflux disease)   . Heart murmur   . Hyperlipidemia   . Hypertension   . Kidney cysts   . Motion sickness    reading in cars  . PONV (postoperative nausea and vomiting)    slow to wake  . Rosacea   . Seasonal affective disorder ([place])   . Seasonal allergies   . 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 1970's  . Vitreous detachment of left eye July 2012   [facility]   Past Surgical  History:  Procedure Laterality Date  . BASAL CELL CARCINOMA EXCISION Left    removed from above left eye   . CHOLECYSTECTOMY  2015  . COLONOSCOPY WITH PROPOFOL N/A 05/10/2015   Procedure: COLONOSCOPY WITH PROPOFOL;  Surgeon: Lucilla Lame, MD;  Location: [facility];  Service: Endoscopy;  Laterality: N/A;  WITH BIOPSY-- SIGMOID COLON POLYP  X  4 DESCENDING COLON POLYP  . DILATATION & CURETTAGE/HYSTEROSCOPY WITH MYOSURE N/A 03/28/2016   Procedure: DILATATION & CURETTAGE/HYSTEROSCOPY WITH MYOSURE;  Surgeon: Boykin Nearing, MD;  Location: [facility];  Service: Gynecology;  Laterality: N/A;  . ESOPHAGOGASTRODUODENOSCOPY (EGD) WITH PROPOFOL N/A 09/02/2017   Procedure: ESOPHAGOGASTRODUODENOSCOPY (EGD) WITH PROPOFOL;  Surgeon: Lin Landsman, MD;  Location: [facility];  Service: Endoscopy;  Laterality: N/A;  . MELANOMA EXCISION     pre melenoma excision on right foot   . OTHER SURGICAL HISTORY     Uterine Polyp   Family History  Problem Relation Age of Onset  . Cancer Mother        possible lung?  . Dementia Mother   . Cancer Sister        kidney  . Diabetes Sister   . COPD Sister   . Cancer  Paternal Grandfather        stomach and prostate  . Diabetes Sister   . Heart disease Sister   . Hypertension Sister   . COPD Sister   . Stroke Maternal Uncle   . Breast cancer Neg Hx    Social History   Socioeconomic History  . Marital status: Married    Spouse name: Not on file  . Number of children: Not on file  . Years of education: Not on file  . Highest education level: High school graduate  Occupational History  . Not on file  Social Needs  . Financial resource strain: Not hard at all  . Food insecurity:    Worry: Never true    Inability: Never true  . Transportation needs:    Medical: No    Non-medical: No  Tobacco Use  . Smoking status: Former Smoker    Packs/day: 1.00    Years: 30.00    Pack years: 30.00    Types: Cigarettes    Last attempt to  quit: 03/13/2000    Years since quitting: 18.1  . Smokeless tobacco: Never Used  Substance and Sexual Activity  . Alcohol use: Yes    Alcohol/week: 0.6 oz    Types: 1 Glasses of wine per week    Comment: occasional  . Drug use: No  . Sexual activity: Not Currently    Partners: Male  Lifestyle  . Physical activity:    Days per week: 0 days    Minutes per session: 0 min  . Stress: Not at all  Relationships  . Social connections:    Talks on phone: More than three times a week    Gets together: More than three times a week    Attends religious service: Never    Active member of club or organization: No    Attends meetings of clubs or organizations: Never    Relationship status: Married  Other Topics Concern  . Not on file  Social History Narrative  . Not on file    Outpatient Encounter Medications as of 05/12/2018  Medication Sig  . amitriptyline (ELAVIL) 25 MG tablet Take 1 tablet (25 mg total) by mouth at bedtime.  . B Complex-Biotin-FA (B-50 COMPLEX PO) Take by mouth.  . Calcium 500 MG CHEW Chew by mouth.  . Cholecalciferol (VITAMIN D3) 2000 UNITS TABS Take 2,000 Units by mouth daily.   . hydrochlorothiazide (HYDRODIURIL) 25 MG tablet TAKE ONE TABLET BY MOUTH EVERY DAY  . Turmeric 500 MG TABS turmeric (bulk)  . hydrochlorothiazide (HYDRODIURIL) 25 MG tablet TAKE ONE TABLET BY MOUTH EVERY DAY (Patient not taking: Reported on 05/12/2018)  . naproxen (NAPROSYN) 500 MG tablet Take 1 tablet (500 mg total) by mouth 2 (two) times daily with a meal.  . omeprazole (PRILOSEC) 20 MG capsule   . ranitidine (ZANTAC) 300 MG tablet Take 1 tablet (300 mg total) by mouth at bedtime. (Patient not taking: Reported on 05/12/2018)   No facility-administered encounter medications on file as of 05/12/2018.     Activities of Daily Living In your present state of health, do you have any difficulty performing the following activities: 05/12/2018 09/02/2017  Hearing? N N  Vision? Y N  Difficulty  concentrating or making decisions? N N  Walking or climbing stairs? Y N  Comment knee pain  -  Dressing or bathing? N N  Doing errands, shopping? N -  Preparing Food and eating ? N -  Using the Toilet? N -  In  the past six months, have you accidently leaked urine? N -  Do you have problems with loss of bowel control? N -  Managing your Medications? N -  Managing your Finances? N -  Housekeeping or managing your Housekeeping? N -  Some recent data might be hidden    Patient Care Team: Valerie Roys, DO as PCP - General (Family Medicine) Lucilla Lame, MD as Consulting Physician (Gastroenterology) Schermerhorn, Gwen Her, MD as Referring Physician (Obstetrics and Gynecology) [facility], [facility], [facility], [facility] (Dermatology) Lyla Glassing, MD as Referring Physician (Ophthalmology)    Assessment:   This is a routine wellness examination for Maiah.  Exercise Activities and Dietary recommendations Current Exercise Habits: The patient does not participate in regular exercise at present, Exercise limited by: None identified  Goals    . DIET - INCREASE WATER INTAKE     Recommend drinking at least 6-8 glasses of water a day        Fall Risk Fall Risk  05/12/2018 05/07/2017 01/26/2017 04/21/2016 05/03/2015  Falls in the past year? No No No No No   Is the patient's home free of loose throw rugs in walkways, pet beds, electrical cords, etc?   yes      Grab bars in the bathroom? yes      Handrails on the stairs?   yes      Adequate lighting?   yes  Timed Get Up and Go performed: Completed in 8 seconds with no use of assistive devices, steady gait. No intervention needed at this time.   Depression Screen PHQ 2/9 Scores 05/12/2018 05/14/2017 05/07/2017 01/26/2017  PHQ - 2 Score 0 0 0 0  PHQ- 9 Score - 0 - 2     Cognitive Function     6CIT Screen 05/12/2018 05/07/2017  What Year? 0 points 0 points  What month? 0 points 0 points  What time? 0 points 0 points    Count back from 20 0 points 0 points  Months in reverse 0 points 0 points  Repeat phrase 0 points 0 points  Total Score 0 0    Immunization History  Administered Date(s) Administered  . Influenza-Unspecified 08/18/2014  . Pneumococcal Conjugate-13 05/03/2015  . Pneumococcal Polysaccharide-23 04/21/2016  . Tdap 05/16/2013  . Zoster Recombinat (Shingrix) 03/09/2018    Qualifies for Shingles Vaccine? Completed  Screening Tests Health Maintenance  Topic Date Due  . MAMMOGRAM  04/14/2018  . INFLUENZA VACCINE  05/13/2018  . COLONOSCOPY  05/09/2020  . TETANUS/TDAP  05/17/2023  . DEXA SCAN  Completed  . Hepatitis C Screening  Completed  . PNA vac Low Risk Adult  Completed    Cancer Screenings: Lung: Low Dose CT Chest recommended if Age 60-80 years, 30 pack-year currently smoking OR have quit w/in 15years. Patient does not qualify. Breast:  Up to date on Mammogram? No   Up to date of Bone Density/Dexa? Yes 04/14/2016 Colorectal: completed 05/10/2015, due 04/2020   Additional Screenings:  Hepatitis C Screening: completed 02/11/2016     Plan:    I have personally reviewed and addressed the Medicare Annual Wellness questionnaire and have noted the following in the patient's chart:  A. Medical and social history B. Use of alcohol, tobacco or illicit drugs  C. Current medications and supplements D. Functional ability and status E.  Nutritional status F.  Physical activity G. Advance directives H. List of other physicians I.  Hospitalizations, surgeries, and ER visits in previous 12 months J.  Vitals  K. Screenings such as hearing and vision if needed, cognitive and depression L. Referrals and appointments   In addition, I have reviewed and discussed with patient certain preventive protocols, quality metrics, and best practice recommendations. A written personalized care plan for preventive services as well as general preventive health recommendations were provided to  patient.   Signed,  Tyler Aas, LPN Nurse Health Advisor   Nurse Notes:none

## 2018-05-18 ENCOUNTER — Encounter: Payer: Medicare Other | Admitting: Family Medicine

## 2018-05-20 ENCOUNTER — Encounter: Payer: Medicare Other | Admitting: Family Medicine

## 2018-05-25 ENCOUNTER — Encounter: Payer: Self-pay | Admitting: Family Medicine

## 2018-05-25 ENCOUNTER — Ambulatory Visit (INDEPENDENT_AMBULATORY_CARE_PROVIDER_SITE_OTHER): Payer: Medicare Other | Admitting: Family Medicine

## 2018-05-25 VITALS — BP 135/86 | HR 79 | Temp 98.3°F | Ht 65.2 in | Wt 193.1 lb

## 2018-05-25 DIAGNOSIS — Z0000 Encounter for general adult medical examination without abnormal findings: Secondary | ICD-10-CM

## 2018-05-25 DIAGNOSIS — F3342 Major depressive disorder, recurrent, in full remission: Secondary | ICD-10-CM

## 2018-05-25 DIAGNOSIS — I700 Atherosclerosis of aorta: Secondary | ICD-10-CM

## 2018-05-25 DIAGNOSIS — Z1231 Encounter for screening mammogram for malignant neoplasm of breast: Secondary | ICD-10-CM

## 2018-05-25 DIAGNOSIS — E782 Mixed hyperlipidemia: Secondary | ICD-10-CM

## 2018-05-25 DIAGNOSIS — R7301 Impaired fasting glucose: Secondary | ICD-10-CM | POA: Diagnosis not present

## 2018-05-25 DIAGNOSIS — E559 Vitamin D deficiency, unspecified: Secondary | ICD-10-CM

## 2018-05-25 DIAGNOSIS — I10 Essential (primary) hypertension: Secondary | ICD-10-CM | POA: Diagnosis not present

## 2018-05-25 DIAGNOSIS — F419 Anxiety disorder, unspecified: Secondary | ICD-10-CM

## 2018-05-25 DIAGNOSIS — Z1239 Encounter for other screening for malignant neoplasm of breast: Secondary | ICD-10-CM

## 2018-05-25 LAB — BAYER DCA HB A1C WAIVED: HB A1C (BAYER DCA - WAIVED): 6 % (ref <7.0)

## 2018-05-25 LAB — UA/M W/RFLX CULTURE, ROUTINE
BILIRUBIN UA: NEGATIVE
GLUCOSE, UA: NEGATIVE
KETONES UA: NEGATIVE
Nitrite, UA: NEGATIVE
PROTEIN UA: NEGATIVE
RBC UA: NEGATIVE
SPEC GRAV UA: 1.015 (ref 1.005–1.030)
Urobilinogen, Ur: 0.2 mg/dL (ref 0.2–1.0)
pH, UA: 5.5 (ref 5.0–7.5)

## 2018-05-25 LAB — MICROSCOPIC EXAMINATION
Bacteria, UA: NONE SEEN
RBC, UA: NONE SEEN /hpf (ref 0–2)

## 2018-05-25 LAB — MICROALBUMIN, URINE WAIVED
CREATININE, URINE WAIVED: 50 mg/dL (ref 10–300)
Microalb, Ur Waived: 10 mg/L (ref 0–19)
Microalb/Creat Ratio: <30 mg/g (ref <30)

## 2018-05-25 MED ORDER — HYDROCHLOROTHIAZIDE 25 MG PO TABS: 25.0000 mg | ORAL_TABLET | Freq: Every day | ORAL | 1 refills | Status: DC

## 2018-05-25 MED ORDER — NORTRIPTYLINE HCL 25 MG PO CAPS: 25.0000 mg | ORAL_CAPSULE | Freq: Every day | ORAL | 1 refills | Status: DC

## 2018-05-25 MED ORDER — NAPROXEN 500 MG PO TABS: 500.0000 mg | ORAL_TABLET | Freq: Two times a day (BID) | ORAL | 3 refills | Status: DC

## 2018-05-25 NOTE — Assessment & Plan Note (Signed)
Not on any medication. Will recheck levels today. Await results. Call with any concerns.

## 2018-05-25 NOTE — Assessment & Plan Note (Signed)
Rechecking levels today. Await results. Call with any concerns.  

## 2018-05-25 NOTE — Assessment & Plan Note (Signed)
Under good control. Call with any concerns. Continue to monitor. Call with any concerns. Refill sent in.

## 2018-05-25 NOTE — Patient Instructions (Addendum)
Please call to schedule your mammogram [facility] [place]: [address], [place], [place] [address]  Phone: [phone]   Health Maintenance for Postmenopausal Women Menopause is a normal process in which your reproductive ability comes to an end. This process happens gradually over a span of months to years, usually between the ages of 57 and 110. Menopause is complete when you have missed 12 consecutive menstrual periods. It is important to talk with your health care provider about some of the most common conditions that affect postmenopausal women, such as heart disease, cancer, and bone loss (osteoporosis). Adopting a healthy lifestyle and getting preventive care can help to promote your health and wellness. Those actions can also lower your chances of developing some of these common conditions. What should I know about menopause? During menopause, you may experience a number of symptoms, such as:  Moderate-to-severe hot flashes.  Night sweats.  Decrease in sex drive.  Mood swings.  Headaches.  Tiredness.  Irritability.  Memory problems.  Insomnia.  Choosing to treat or not to treat menopausal changes is an individual decision that you make with your health care provider. What should I know about hormone replacement therapy and supplements? Hormone therapy products are effective for treating symptoms that are associated with menopause, such as hot flashes and night sweats. Hormone replacement carries certain risks, especially as you become older. If you are thinking about using estrogen or estrogen with progestin treatments, discuss the benefits and risks with your health care provider. What should I know about heart disease and stroke? Heart disease, heart attack, and stroke become more likely as you age. This may be due, in part, to the hormonal changes that your body experiences during menopause. These can affect how your body  processes dietary fats, triglycerides, and cholesterol. Heart attack and stroke are both medical emergencies. There are many things that you can do to help prevent heart disease and stroke:  Have your blood pressure checked at least every 1-2 years. High blood pressure causes heart disease and increases the risk of stroke.  If you are 64-7 years old, ask your health care provider if you should take aspirin to prevent a heart attack or a stroke.  Do not use any tobacco products, including cigarettes, chewing tobacco, or electronic cigarettes. If you need help quitting, ask your health care provider.  It is important to eat a healthy diet and maintain a healthy weight. ? Be sure to include plenty of vegetables, fruits, low-fat dairy products, and lean protein. ? Avoid eating foods that are high in solid fats, added sugars, or salt (sodium).  Get regular exercise. This is one of the most important things that you can do for your health. ? Try to exercise for at least 150 minutes each week. The type of exercise that you do should increase your heart rate and make you sweat. This is known as moderate-intensity exercise. ? Try to do strengthening exercises at least twice each week. Do these in addition to the moderate-intensity exercise.  Know your numbers.Ask your health care provider to check your cholesterol and your blood glucose. Continue to have your blood tested as directed by your health care provider.  What should I know about cancer screening? There are several types of cancer. Take the following steps to reduce your risk and to catch any cancer development as early as possible. Breast Cancer  Practice breast self-awareness. ? This means understanding how your breasts normally appear  and feel. ? It also means doing regular breast self-exams. Let your health care provider know about any changes, no matter how small.  If you are 35 or older, have a clinician do a breast exam (clinical  breast exam or CBE) every year. Depending on your age, family history, and medical history, it may be recommended that you also have a yearly breast X-ray (mammogram).  If you have a family history of breast cancer, talk with your health care provider about genetic screening.  If you are at high risk for breast cancer, talk with your health care provider about having an MRI and a mammogram every year.  Breast cancer (BRCA) gene test is recommended for women who have family members with BRCA-related cancers. Results of the assessment will determine the need for genetic counseling and BRCA1 and for BRCA2 testing. BRCA-related cancers include these types: ? Breast. This occurs in males or females. ? Ovarian. ? Tubal. This may also be called fallopian tube cancer. ? Cancer of the abdominal or pelvic lining (peritoneal cancer). ? Prostate. ? Pancreatic.  Cervical, Uterine, and Ovarian Cancer Your health care provider may recommend that you be screened regularly for cancer of the pelvic organs. These include your ovaries, uterus, and vagina. This screening involves a pelvic exam, which includes checking for microscopic changes to the surface of your cervix (Pap test).  For women ages 21-65, health care providers may recommend a pelvic exam and a Pap test every three years. For women ages 24-65, they may recommend the Pap test and pelvic exam, combined with testing for human papilloma virus (HPV), every five years. Some types of HPV increase your risk of cervical cancer. Testing for HPV may also be done on women of any age who have unclear Pap test results.  Other health care providers may not recommend any screening for nonpregnant women who are considered low risk for pelvic cancer and have no symptoms. Ask your health care provider if a screening pelvic exam is right for you.  If you have had past treatment for cervical cancer or a condition that could lead to cancer, you need Pap tests and  screening for cancer for at least 20 years after your treatment. If Pap tests have been discontinued for you, your risk factors (such as having a new sexual partner) need to be reassessed to determine if you should start having screenings again. Some women have medical problems that increase the chance of getting cervical cancer. In these cases, your health care provider may recommend that you have screening and Pap tests more often.  If you have a family history of uterine cancer or ovarian cancer, talk with your health care provider about genetic screening.  If you have vaginal bleeding after reaching menopause, tell your health care provider.  There are currently no reliable tests available to screen for ovarian cancer.  Lung Cancer Lung cancer screening is recommended for adults 47-74 years old who are at high risk for lung cancer because of a history of smoking. A yearly low-dose CT scan of the lungs is recommended if you:  Currently smoke.  Have a history of at least 30 pack-years of smoking and you currently smoke or have quit within the past 15 years. A pack-year is smoking an average of one pack of cigarettes per day for one year.  Yearly screening should:  Continue until it has been 15 years since you quit.  Stop if you develop a health problem that would prevent you from  having lung cancer treatment.  Colorectal Cancer  This type of cancer can be detected and can often be prevented.  Routine colorectal cancer screening usually begins at age 80 and continues through age 16.  If you have risk factors for colon cancer, your health care provider may recommend that you be screened at an earlier age.  If you have a family history of colorectal cancer, talk with your health care provider about genetic screening.  Your health care provider may also recommend using home test kits to check for hidden blood in your stool.  A small camera at the end of a tube can be used to examine  your colon directly (sigmoidoscopy or colonoscopy). This is done to check for the earliest forms of colorectal cancer.  Direct examination of the colon should be repeated every 5-10 years until age 56. However, if early forms of precancerous polyps or small growths are found or if you have a family history or genetic risk for colorectal cancer, you may need to be screened more often.  Skin Cancer  Check your skin from head to toe regularly.  Monitor any moles. Be sure to tell your health care provider: ? About any new moles or changes in moles, especially if there is a change in a mole's shape or color. ? If you have a mole that is larger than the size of a pencil eraser.  If any of your family members has a history of skin cancer, especially at a young age, talk with your health care provider about genetic screening.  Always use sunscreen. Apply sunscreen liberally and repeatedly throughout the day.  Whenever you are outside, protect yourself by wearing long sleeves, pants, a wide-brimmed hat, and sunglasses.  What should I know about osteoporosis? Osteoporosis is a condition in which bone destruction happens more quickly than new bone creation. After menopause, you may be at an increased risk for osteoporosis. To help prevent osteoporosis or the bone fractures that can happen because of osteoporosis, the following is recommended:  If you are 102-51 years old, get at least 1,000 mg of calcium and at least 600 mg of vitamin D per day.  If you are older than age 20 but younger than age 29, get at least 1,200 mg of calcium and at least 600 mg of vitamin D per day.  If you are older than age 47, get at least 1,200 mg of calcium and at least 800 mg of vitamin D per day.  Smoking and excessive alcohol intake increase the risk of osteoporosis. Eat foods that are rich in calcium and vitamin D, and do weight-bearing exercises several times each week as directed by your health care provider. What  should I know about how menopause affects my mental health? Depression may occur at any age, but it is more common as you become older. Common symptoms of depression include:  Low or sad mood.  Changes in sleep patterns.  Changes in appetite or eating patterns.  Feeling an overall lack of motivation or enjoyment of activities that you previously enjoyed.  Frequent crying spells.  Talk with your health care provider if you think that you are experiencing depression. What should I know about immunizations? It is important that you get and maintain your immunizations. These include:  Tetanus, diphtheria, and pertussis (Tdap) booster vaccine.  Influenza every year before the flu season begins.  Pneumonia vaccine.  Shingles vaccine.  Your health care provider may also recommend other immunizations. This information is not  intended to replace advice given to you by your health care provider. Make sure you discuss any questions you have with your health care provider. Document Released: 11/21/2005 Document Revised: 04/18/2016 Document Reviewed: 07/03/2015 [employer]vier Interactive Patient Education  2018 [employer].

## 2018-05-25 NOTE — Progress Notes (Signed)
BP 135/86 (BP Location: Left Arm, Patient Position: Sitting, Cuff Size: Normal)   Pulse 79   Temp 98.3 F (36.8 C)   Ht 5' 5.2" (1.656 m)   Wt 193 lb 1 oz (87.6 kg)   SpO2 95%   BMI 31.93 kg/m    Subjective:    Patient ID: Mary Galvan, female    DOB: [birth year], 67 y.o.   MRN: [id number]  HPI: Mary Galvan is a 67 y.o. female presenting on 05/25/2018 for comprehensive medical examination. Current medical complaints include:  Impaired Fasting Glucose Duration of elevated blood sugar: chronic Polydipsia: no Polyuria: no Weight change: no Visual disturbance: no Glucose Monitoring: no  Diabetic Education: Completed Family history of diabetes: yes  HYPERTENSION / HYPERLIPIDEMIA Satisfied with current treatment? yes Duration of hypertension: chronic BP monitoring frequency: not checking BP medication side effects: no Past BP meds: hctz Duration of hyperlipidemia: chronic Cholesterol medication side effects: Not on anything Cholesterol supplements: none Past cholesterol medications: none Medication compliance: excellent compliance Aspirin: no Recent stressors: no Recurrent headaches: no Visual changes: no Palpitations: no Dyspnea: no Chest pain: no Lower extremity edema: yes Dizzy/lightheaded: no  DEPRESSION- doing much better with the amitriptyline, but giving her very dry eyes and mouth Mood status: better Satisfied with current treatment?: yes Symptom severity: mild  Duration of current treatment : chronic Side effects: no Medication compliance: excellent compliance Psychotherapy/counseling: no  Previous psychiatric medications: amitriptyline Depressed mood: no Anxious mood: no Anhedonia: no Significant weight loss or gain: no Insomnia: no  Fatigue: no Feelings of worthlessness or guilt: no Impaired concentration/indecisiveness: no Suicidal ideations: no Hopelessness: no Crying spells: no Depression screen [facility] 2/9 05/25/2018 05/12/2018  05/14/2017 05/07/2017 01/26/2017  Decreased Interest 0 0 0 0 0  Down, Depressed, Hopeless 0 0 0 0 0  PHQ - 2 Score 0 0 0 0 0  Altered sleeping 0 - 0 - 0  Tired, decreased energy 0 - 0 - 1  Change in appetite 0 - 0 - 1  Feeling bad or failure about yourself  0 - 0 - 0  Trouble concentrating 0 - 0 - 0  Moving slowly or fidgety/restless 0 - 0 - 0  Suicidal thoughts 0 - 0 - 0  PHQ-9 Score 0 - 0 - 2  Difficult doing work/chores Not difficult at all - - - -   Menopausal Symptoms: yes- having some issues with hot flashes  Depression Screen done today and results listed below:  Depression screen [facility] 2/9 05/25/2018 05/12/2018 05/14/2017 05/07/2017 01/26/2017  Decreased Interest 0 0 0 0 0  Down, Depressed, Hopeless 0 0 0 0 0  PHQ - 2 Score 0 0 0 0 0  Altered sleeping 0 - 0 - 0  Tired, decreased energy 0 - 0 - 1  Change in appetite 0 - 0 - 1  Feeling bad or failure about yourself  0 - 0 - 0  Trouble concentrating 0 - 0 - 0  Moving slowly or fidgety/restless 0 - 0 - 0  Suicidal thoughts 0 - 0 - 0  PHQ-9 Score 0 - 0 - 2  Difficult doing work/chores Not difficult at all - - - -    Past Medical History:  Past Medical History:  Diagnosis Date  . Anxiety   . Arthritis    thumbs  . Depression   . Fatty liver disease, nonalcoholic   . GERD (gastroesophageal reflux disease)   . Heart murmur   . Hyperlipidemia   .  Hypertension   . Kidney cysts   . Motion sickness    reading in cars  . PONV (postoperative nausea and vomiting)    slow to wake  . Rosacea   . Seasonal affective disorder ([place])   . Seasonal allergies   . Seizures ([place])    x1 - after head trauma. 1970's  . Vitreous detachment of left eye July 2012   [facility]    Surgical History:  Past Surgical History:  Procedure Laterality Date  . BASAL CELL CARCINOMA EXCISION Left    removed from above left eye   . CHOLECYSTECTOMY  2015  . COLONOSCOPY WITH PROPOFOL N/A 05/10/2015   Procedure: COLONOSCOPY WITH PROPOFOL;   Surgeon: Lucilla Lame, MD;  Location: [place];  Service: Endoscopy;  Laterality: N/A;  WITH BIOPSY-- SIGMOID COLON POLYP  X  4 DESCENDING COLON POLYP  . DILATATION & CURETTAGE/HYSTEROSCOPY WITH MYOSURE N/A 03/28/2016   Procedure: DILATATION & CURETTAGE/HYSTEROSCOPY WITH MYOSURE;  Surgeon: Boykin Nearing, MD;  Location: [facility];  Service: Gynecology;  Laterality: N/A;  . ESOPHAGOGASTRODUODENOSCOPY (EGD) WITH PROPOFOL N/A 09/02/2017   Procedure: ESOPHAGOGASTRODUODENOSCOPY (EGD) WITH PROPOFOL;  Surgeon: Lin Landsman, MD;  Location: [place];  Service: Endoscopy;  Laterality: N/A;  . MELANOMA EXCISION     pre melenoma excision on right foot   . OTHER SURGICAL HISTORY     Uterine Polyp    Medications:  Current Outpatient Medications on File Prior to Visit  Medication Sig  . B Complex-Biotin-FA (B-50 COMPLEX PO) Take by mouth.  . Calcium 500 MG CHEW Chew by mouth.  . Cholecalciferol (VITAMIN D3) 2000 UNITS TABS Take 2,000 Units by mouth daily.   . Turmeric 500 MG TABS turmeric (bulk)   No current facility-administered medications on file prior to visit.     Allergies:  No Known Allergies  Social History:  Social History   Socioeconomic History  . Marital status: Married    Spouse name: Not on file  . Number of children: Not on file  . Years of education: Not on file  . Highest education level: High school graduate  Occupational History  . Not on file  Social Needs  . Financial resource strain: Not hard at all  . Food insecurity:    Worry: Never true    Inability: Never true  . Transportation needs:    Medical: No    Non-medical: No  Tobacco Use  . Smoking status: Former Smoker    Packs/day: 1.00    Years: 30.00    Pack years: 30.00    Types: Cigarettes    Last attempt to quit: 03/13/2000    Years since quitting: 18.2  . Smokeless tobacco: Never Used  Substance and Sexual Activity  . Alcohol use: Yes    Alcohol/week: 1.0 standard  drinks    Types: 1 Glasses of wine per week    Comment: occasional  . Drug use: No  . Sexual activity: Not Currently    Partners: Male  Lifestyle  . Physical activity:    Days per week: 0 days    Minutes per session: 0 min  . Stress: Not at all  Relationships  . Social connections:    Talks on phone: More than three times a week    Gets together: More than three times a week    Attends religious service: Never    Active member of club or organization: No    Attends meetings of clubs or organizations: Never  Relationship status: Married  . Intimate partner violence:    Fear of current or ex partner: No    Emotionally abused: No    Physically abused: No    Forced sexual activity: No  Other Topics Concern  . Not on file  Social History Narrative  . Not on file   Social History   Tobacco Use  Smoking Status Former Smoker  . Packs/day: 1.00  . Years: 30.00  . Pack years: 30.00  . Types: Cigarettes  . Last attempt to quit: 03/13/2000  . Years since quitting: 18.2  Smokeless Tobacco Never Used   Social History   Substance and Sexual Activity  Alcohol Use Yes  . Alcohol/week: 1.0 standard drinks  . Types: 1 Glasses of wine per week   Comment: occasional    Family History:  Family History  Problem Relation Age of Onset  . Cancer Mother        possible lung?  . Dementia Mother   . Cancer Sister        kidney  . Diabetes Sister   . COPD Sister   . Cancer Paternal Grandfather        stomach and prostate  . Diabetes Sister   . Heart disease Sister   . Hypertension Sister   . COPD Sister   . Stroke Maternal Uncle   . Breast cancer Neg Hx     Past medical history, surgical history, medications, allergies, family history and social history reviewed with patient today and changes made to appropriate areas of the chart.   Review of Systems  Constitutional: Negative.   HENT: Positive for tinnitus. Negative for congestion, ear discharge, ear pain, hearing loss,  nosebleeds, sinus pain and sore throat.        Dry mouth   Eyes: Negative.        Floaters   Respiratory: Negative.  Negative for stridor.   Cardiovascular: Positive for leg swelling. Negative for chest pain, palpitations, orthopnea, claudication and PND.  Gastrointestinal: Negative.   Genitourinary: Negative.   Musculoskeletal: Negative.   Skin: Negative.   Neurological: Positive for tingling (the heels of her feet).  Endo/Heme/Allergies: Positive for polydipsia. Negative for environmental allergies. Does not bruise/bleed easily.  Psychiatric/Behavioral: Negative.     All other ROS negative except what is listed above and in the HPI.      Objective:    BP 135/86 (BP Location: Left Arm, Patient Position: Sitting, Cuff Size: Normal)   Pulse 79   Temp 98.3 F (36.8 C)   Ht 5' 5.2" (1.656 m)   Wt 193 lb 1 oz (87.6 kg)   SpO2 95%   BMI 31.93 kg/m   Wt Readings from Last 3 Encounters:  05/25/18 193 lb 1 oz (87.6 kg)  05/12/18 192 lb (87.1 kg)  09/02/17 187 lb (84.8 kg)    Physical Exam  Constitutional: She is oriented to person, place, and time. She appears well-developed and well-nourished. No distress.  HENT:  Head: Normocephalic and atraumatic.  Right Ear: Hearing, tympanic membrane, external ear and ear canal normal.  Left Ear: Hearing, tympanic membrane, external ear and ear canal normal.  Nose: Nose normal.  Mouth/Throat: Uvula is midline, oropharynx is clear and moist and mucous membranes are normal. No oropharyngeal exudate.  Eyes: Pupils are equal, round, and reactive to light. Conjunctivae, EOM and lids are normal. Right eye exhibits no discharge. Left eye exhibits no discharge. No scleral icterus.  Neck: Normal range of motion. Neck supple. No JVD  present. No tracheal deviation present. No thyromegaly present.  Cardiovascular: Normal rate, regular rhythm, normal heart sounds and intact distal pulses. Exam reveals no gallop and no friction rub.  No murmur  heard. Pulmonary/Chest: Effort normal and breath sounds normal. No stridor. No respiratory distress. She has no wheezes. She has no rales. She exhibits no tenderness.  Abdominal: Soft. Bowel sounds are normal. She exhibits no distension and no mass. There is no tenderness. There is no rebound and no guarding. No hernia.  Genitourinary:  Genitourinary Comments: Breast and pelvic exams deferred with shared decision making  Musculoskeletal: Normal range of motion. She exhibits no edema, tenderness or deformity.  Lymphadenopathy:    She has no cervical adenopathy.  Neurological: She is alert and oriented to person, place, and time. She displays normal reflexes. No cranial nerve deficit or sensory deficit. She exhibits normal muscle tone. Coordination normal.  Skin: Skin is warm, dry and intact. Capillary refill takes less than 2 seconds. No rash noted. She is not diaphoretic. No erythema. No pallor.  Psychiatric: She has a normal mood and affect. Her speech is normal and behavior is normal. Judgment and thought content normal. Cognition and memory are normal.  Nursing note and vitals reviewed.   Results for orders placed or performed in visit on 05/07/17  Bayer DCA Hb A1c Waived  Result Value Ref Range   HB A1C (BAYER DCA - WAIVED) 5.9 <7.0 %  Lipid Panel w/o Chol/HDL Ratio  Result Value Ref Range   Cholesterol, Total 241 (H) 100 - 199 mg/dL   Triglycerides 197 (H) 0 - 149 mg/dL   HDL 53 >39 mg/dL   VLDL Cholesterol Cal 39 5 - 40 mg/dL   LDL Calculated 149 (H) 0 - 99 mg/dL      Assessment & Plan:   Problem List Items Addressed This Visit      Cardiovascular and Mediastinum   Hypertension    Under good control. Call with any concerns. Continue to monitor. Call with any concerns. Refill sent in.      Relevant Medications   hydrochlorothiazide (HYDRODIURIL) 25 MG tablet   Other Relevant Orders   CBC with Differential/Platelet   Comprehensive metabolic panel   Microalbumin, Urine  Waived   TSH   UA/M w/rflx Culture, Routine   Abdominal aortic atherosclerosis (HCC)    Will keep BP, sugars and cholesterol under good control continue to monitor. Call with any concerns.       Relevant Medications   hydrochlorothiazide (HYDRODIURIL) 25 MG tablet   Other Relevant Orders   CBC with Differential/Platelet   Comprehensive metabolic panel   TSH   UA/M w/rflx Culture, Routine     Endocrine   IFG (impaired fasting glucose)    Rechecking labs today. Await results. Call with any concerns.      Relevant Orders   CBC with Differential/Platelet   Bayer DCA Hb A1c Waived   Comprehensive metabolic panel   Microalbumin, Urine Waived   TSH   UA/M w/rflx Culture, Routine     Other   Hyperlipidemia    Not on any medication. Will recheck levels today. Await results. Call with any concerns.       Relevant Medications   hydrochlorothiazide (HYDRODIURIL) 25 MG tablet   Other Relevant Orders   CBC with Differential/Platelet   Comprehensive metabolic panel   Lipid Panel w/o Chol/HDL Ratio   TSH   UA/M w/rflx Culture, Routine   Depression    Under good control. Will change to  nortripyline from amitriptyline due to dry eye. Call with any concerns.       Relevant Medications   nortriptyline (PAMELOR) 25 MG capsule   Other Relevant Orders   CBC with Differential/Platelet   Comprehensive metabolic panel   TSH   UA/M w/rflx Culture, Routine   Anxiety    Under good control. Will change to nortripyline from amitriptyline due to dry eye. Call with any concerns.       Relevant Medications   nortriptyline (PAMELOR) 25 MG capsule   Vitamin D deficiency    Rechecking levels today. Await results. Call with any concerns.       Relevant Orders   CBC with Differential/Platelet   Comprehensive metabolic panel   TSH   UA/M w/rflx Culture, Routine   VITAMIN D 25 Hydroxy (Vit-D Deficiency, Fractures)    Other Visit Diagnoses    Routine general medical examination at a  health care facility    -  Primary   Vaccines up to date. Screening labs checked today. Colonoscopy up to date. Mammogram ordered. DEXA up to date. Call with any concerns. Continue to monitor.    Screening for breast cancer       Mammogram ordered today.   Relevant Orders   MM DIGITAL SCREENING BILATERAL       Follow up plan: Return in about 6 months (around 11/25/2018) for Follow up.   LABORATORY TESTING:  - Pap smear: not applicable  IMMUNIZATIONS:   - Tdap: Tetanus vaccination status reviewed: last tetanus booster within 10 years. - Influenza: Postponed to flu season - Pneumovax: Up to date - Prevnar: Up to date  SCREENING: -Mammogram: Ordered today  - Colonoscopy: Up to date  - Bone Density: Up to date   PATIENT COUNSELING:   Advised to take 1 mg of folate supplement per day if capable of pregnancy.   Sexuality: Discussed sexually transmitted diseases, partner selection, use of condoms, avoidance of unintended pregnancy  and contraceptive alternatives.   Advised to avoid cigarette smoking.  I discussed with the patient that most people either abstain from alcohol or drink within safe limits (<=14/week and <=4 drinks/occasion for males, <=7/weeks and <= 3 drinks/occasion for females) and that the risk for alcohol disorders and other health effects rises proportionally with the number of drinks per week and how often a drinker exceeds daily limits.  Discussed cessation/primary prevention of drug use and availability of treatment for abuse.   Diet: Encouraged to adjust caloric intake to maintain  or achieve ideal body weight, to reduce intake of dietary saturated fat and total fat, to limit sodium intake by avoiding high sodium foods and not adding table salt, and to maintain adequate dietary potassium and calcium preferably from fresh fruits, vegetables, and low-fat dairy products.    stressed the importance of regular exercise  Injury prevention: Discussed safety belts,  safety helmets, smoke detector, smoking near bedding or upholstery.   Dental health: Discussed importance of regular tooth brushing, flossing, and dental visits.    NEXT PREVENTATIVE PHYSICAL DUE IN 1 YEAR. Return in about 6 months (around 11/25/2018) for Follow up.

## 2018-05-25 NOTE — Assessment & Plan Note (Signed)
Under good control. Will change to nortripyline from amitriptyline due to dry eye. Call with any concerns.

## 2018-05-25 NOTE — Assessment & Plan Note (Signed)
Will keep BP, sugars and cholesterol under good control continue to monitor. Call with any concerns.

## 2018-05-25 NOTE — Assessment & Plan Note (Signed)
Rechecking labs today. Await results. Call with any concerns.  

## 2018-05-26 LAB — COMPREHENSIVE METABOLIC PANEL
ALK PHOS: 62 IU/L (ref 39–117)
ALT: 44 IU/L — ABNORMAL HIGH (ref 0–32)
AST: 31 IU/L (ref 0–40)
Albumin/Globulin Ratio: 1.4 (ref 1.2–2.2)
Albumin: 4.2 g/dL (ref 3.6–4.8)
BILIRUBIN TOTAL: 0.4 mg/dL (ref 0.0–1.2)
BUN / CREAT RATIO: 18 (ref 12–28)
BUN: 12 mg/dL (ref 8–27)
CHLORIDE: 100 mmol/L (ref 96–106)
CO2: 25 mmol/L (ref 20–29)
CREATININE: 0.65 mg/dL (ref 0.57–1.00)
Calcium: 9.9 mg/dL (ref 8.7–10.3)
GFR calc Af Amer: 105 mL/min/{1.73_m2} (ref >59)
GFR calc non Af Amer: 92 mL/min/{1.73_m2} (ref >59)
GLUCOSE: 107 mg/dL — AB (ref 65–99)
Globulin, Total: 3 g/dL (ref 1.5–4.5)
Potassium: 4.3 mmol/L (ref 3.5–5.2)
Sodium: 140 mmol/L (ref 134–144)
Total Protein: 7.2 g/dL (ref 6.0–8.5)

## 2018-05-26 LAB — TSH: TSH: 1.82 u[IU]/mL (ref 0.450–4.500)

## 2018-05-26 LAB — LIPID PANEL W/O CHOL/HDL RATIO
CHOLESTEROL TOTAL: 228 mg/dL — AB (ref 100–199)
HDL: 54 mg/dL (ref >39)
LDL CALC: 133 mg/dL — AB (ref 0–99)
TRIGLYCERIDES: 203 mg/dL — AB (ref 0–149)
VLDL CHOLESTEROL CAL: 41 mg/dL — AB (ref 5–40)

## 2018-05-26 LAB — CBC WITH DIFFERENTIAL/PLATELET
Basophils Absolute: 0 10*3/uL (ref 0.0–0.2)
Basos: 0 %
EOS (ABSOLUTE): 0.1 10*3/uL (ref 0.0–0.4)
Eos: 1 %
HEMOGLOBIN: 15.1 g/dL (ref 11.1–15.9)
Hematocrit: 45.8 % (ref 34.0–46.6)
Immature Grans (Abs): 0 10*3/uL (ref 0.0–0.1)
Immature Granulocytes: 0 %
LYMPHS ABS: 2.8 10*3/uL (ref 0.7–3.1)
Lymphs: 46 %
MCH: 30.5 pg (ref 26.6–33.0)
MCHC: 33 g/dL (ref 31.5–35.7)
MCV: 93 fL (ref 79–97)
MONOCYTES: 8 %
Monocytes Absolute: 0.5 10*3/uL (ref 0.1–0.9)
NEUTROS ABS: 2.8 10*3/uL (ref 1.4–7.0)
Neutrophils: 45 %
Platelets: 266 10*3/uL (ref 150–450)
RBC: 4.95 x10E6/uL (ref 3.77–5.28)
RDW: 13.3 % (ref 12.3–15.4)
WBC: 6.2 10*3/uL (ref 3.4–10.8)

## 2018-05-26 LAB — VITAMIN D 25 HYDROXY (VIT D DEFICIENCY, FRACTURES): Vit D, 25-Hydroxy: 35.1 ng/mL (ref 30.0–100.0)

## 2018-08-20 ENCOUNTER — Telehealth: Payer: Self-pay | Admitting: Family Medicine

## 2018-08-20 NOTE — Telephone Encounter (Signed)
Copied from [place] [id number]. Topic: Quick Communication - Rx Refill/Question >> Aug 20, 2018  9:20 AM Reyne Dumas L wrote: Medication:  nortriptyline (PAMELOR) 25 MG capsule  Pt wants to come off of this medication.  She only has enough to get her through the weekend.  Pt would like to know how she can come off and only have enough called in for that to happen. Pt can be reached at [phone]  Has the patient contacted their pharmacy? no (Agent: If no, request that the patient contact the pharmacy for the refill.) (Agent: If yes, when and what did the pharmacy advise?)  Preferred Pharmacy (with phone number or street name): [place], [place] - [place] [phone] (Phone) [phone] (Fax)  Agent: Please be advised that RX refills may take up to 3 business days. We ask that you follow-up with your pharmacy.

## 2018-08-20 NOTE — Telephone Encounter (Signed)
The medication is a capsule, how will she do this?

## 2018-08-20 NOTE — Telephone Encounter (Signed)
Have her cut it in 1/2 for 3-4 days, then stop it

## 2018-08-20 NOTE — Telephone Encounter (Signed)
Have her take it every other day for 3-4 days, then stop

## 2018-08-20 NOTE — Telephone Encounter (Signed)
Clarified with patient that she will take 1 capsule every other day until Monday.

## 2018-08-20 NOTE — Telephone Encounter (Signed)
Spoke with patient and Dr. Wynetta Emery.  Patient will take amitriptyline tablet 1/2 3-4, and then stopped.  Patient verbalized understanding.

## 2018-10-19 DIAGNOSIS — H2513 Age-related nuclear cataract, bilateral: Secondary | ICD-10-CM | POA: Diagnosis not present

## 2018-11-30 ENCOUNTER — Ambulatory Visit: Payer: Medicare Other | Admitting: Family Medicine

## 2018-12-14 ENCOUNTER — Encounter: Payer: Self-pay | Admitting: Family Medicine

## 2018-12-14 ENCOUNTER — Ambulatory Visit (INDEPENDENT_AMBULATORY_CARE_PROVIDER_SITE_OTHER): Payer: Medicare Other | Admitting: Family Medicine

## 2018-12-14 VITALS — BP 138/84 | HR 75 | Temp 98.3°F | Ht 65.2 in | Wt 190.0 lb

## 2018-12-14 DIAGNOSIS — I700 Atherosclerosis of aorta: Secondary | ICD-10-CM

## 2018-12-14 DIAGNOSIS — E782 Mixed hyperlipidemia: Secondary | ICD-10-CM

## 2018-12-14 DIAGNOSIS — I10 Essential (primary) hypertension: Secondary | ICD-10-CM

## 2018-12-14 DIAGNOSIS — E559 Vitamin D deficiency, unspecified: Secondary | ICD-10-CM

## 2018-12-14 DIAGNOSIS — R109 Unspecified abdominal pain: Secondary | ICD-10-CM

## 2018-12-14 DIAGNOSIS — F419 Anxiety disorder, unspecified: Secondary | ICD-10-CM

## 2018-12-14 DIAGNOSIS — R7301 Impaired fasting glucose: Secondary | ICD-10-CM | POA: Diagnosis not present

## 2018-12-14 DIAGNOSIS — F3342 Major depressive disorder, recurrent, in full remission: Secondary | ICD-10-CM

## 2018-12-14 LAB — BAYER DCA HB A1C WAIVED: HB A1C: 6.1 % (ref <7.0)

## 2018-12-14 MED ORDER — HYDROCHLOROTHIAZIDE 25 MG PO TABS: 25.0000 mg | ORAL_TABLET | Freq: Every day | ORAL | 1 refills | Status: DC

## 2018-12-14 NOTE — Progress Notes (Signed)
BP 138/84   Pulse 75   Temp 98.3 F (36.8 C) (Oral)   Ht 5' 5.2" (1.656 m)   Wt 190 lb (86.2 kg)   SpO2 98%   BMI 31.42 kg/m    Subjective:    Patient ID: Mary Galvan, female    DOB: [birth year], 68 y.o.   MRN: [id number]  HPI: Mary Galvan is a 68 y.o. female  Chief Complaint  Patient presents with  . Follow-up  . Flank Pain   HYPERTENSION / [place] Satisfied with current treatment? yes Duration of hypertension: chronic BP monitoring frequency: not checking BP medication side effects: no Past BP meds: HCTZ Duration of hyperlipidemia: chronic Cholesterol medication side effects: Not on anything Cholesterol supplements: none Past cholesterol medications: none Medication compliance: excellent compliance Aspirin: no Recent stressors: no Recurrent headaches: no Visual changes: no Palpitations: no Dyspnea: no Chest pain: no Lower extremity edema: no Dizzy/lightheaded: no  Impaired Fasting Glucose HbA1C:  Lab Results  Component Value Date   HGBA1C 6.0 05/25/2018   Duration of elevated blood sugar:  Polydipsia: no Polyuria: no Weight change: no Visual disturbance: no Glucose Monitoring: no Diabetic Education: Not Completed Family history of diabetes: yes  Has been having some discomfort on the L side of her belly- treated for reflux before without much benefit. Did have a diverticula there on EGD.   Relevant past medical, surgical, family and social history reviewed and updated as indicated. Interim medical history since our last visit reviewed. Allergies and medications reviewed and updated.  Review of Systems  Constitutional: Negative.   Respiratory: Negative.   Cardiovascular: Negative.   Gastrointestinal: Negative.   Musculoskeletal: Negative.   Psychiatric/Behavioral: Negative.     Per HPI unless specifically indicated above     Objective:    BP 138/84   Pulse 75   Temp 98.3 F (36.8 C) (Oral)   Ht 5' 5.2" (1.656 m)    Wt 190 lb (86.2 kg)   SpO2 98%   BMI 31.42 kg/m   Wt Readings from Last 3 Encounters:  12/14/18 190 lb (86.2 kg)  05/25/18 193 lb 1 oz (87.6 kg)  05/12/18 192 lb (87.1 kg)    Physical Exam Vitals signs and nursing note reviewed.  Constitutional:      General: She is not in acute distress.    Appearance: Normal appearance. She is not ill-appearing, toxic-appearing or diaphoretic.  HENT:     Head: Normocephalic and atraumatic.     Right Ear: External ear normal.     Left Ear: External ear normal.     Nose: Nose normal.     Mouth/Throat:     Mouth: Mucous membranes are moist.     Pharynx: Oropharynx is clear.  Eyes:     General: No scleral icterus.       Right eye: No discharge.        Left eye: No discharge.     Extraocular Movements: Extraocular movements intact.     Conjunctiva/sclera: Conjunctivae normal.     Pupils: Pupils are equal, round, and reactive to light.  Neck:     Musculoskeletal: Normal range of motion and neck supple.  Cardiovascular:     Rate and Rhythm: Normal rate and regular rhythm.     Pulses: Normal pulses.     Heart sounds: Normal heart sounds. No murmur. No friction rub. No gallop.   Pulmonary:     Effort: Pulmonary effort is normal. No respiratory distress.     Breath  sounds: Normal breath sounds. No stridor. No wheezing, rhonchi or rales.  Chest:     Chest wall: No tenderness.  Musculoskeletal: Normal range of motion.  Skin:    General: Skin is warm and dry.     Capillary Refill: Capillary refill takes less than 2 seconds.     Coloration: Skin is not jaundiced or pale.     Findings: No bruising, erythema, lesion or rash.  Neurological:     General: No focal deficit present.     Mental Status: She is alert and oriented to person, place, and time. Mental status is at baseline.  Psychiatric:        Mood and Affect: Mood normal.        Behavior: Behavior normal.        Thought Content: Thought content normal.        Judgment: Judgment normal.       Results for orders placed or performed in visit on 05/25/18  Microscopic Examination  Result Value Ref Range   WBC, UA 0-5 0 - 5 /hpf   RBC, UA None seen 0 - 2 /hpf   Epithelial Cells (non renal) 0-10 0 - 10 /hpf   Renal Epithel, UA 0-10 (A) None seen /hpf   Bacteria, UA None seen None seen/Few  CBC with Differential/Platelet  Result Value Ref Range   WBC 6.2 3.4 - 10.8 x10E3/uL   RBC 4.95 3.77 - 5.28 x10E6/uL   Hemoglobin 15.1 11.1 - 15.9 g/dL   Hematocrit 45.8 34.0 - 46.6 %   MCV 93 79 - 97 fL   MCH 30.5 26.6 - 33.0 pg   MCHC 33.0 31.5 - 35.7 g/dL   RDW 13.3 12.3 - 15.4 %   Platelets 266 150 - 450 x10E3/uL   Neutrophils 45 Not Estab. %   Lymphs 46 Not Estab. %   Monocytes 8 Not Estab. %   Eos 1 Not Estab. %   Basos 0 Not Estab. %   Neutrophils Absolute 2.8 1.4 - 7.0 x10E3/uL   Lymphocytes Absolute 2.8 0.7 - 3.1 x10E3/uL   Monocytes Absolute 0.5 0.1 - 0.9 x10E3/uL   EOS (ABSOLUTE) 0.1 0.0 - 0.4 x10E3/uL   Basophils Absolute 0.0 0.0 - 0.2 x10E3/uL   Immature Granulocytes 0 Not Estab. %   Immature Grans (Abs) 0.0 0.0 - 0.1 x10E3/uL  Bayer DCA Hb A1c Waived  Result Value Ref Range   HB A1C (BAYER DCA - WAIVED) 6.0 <7.0 %  Comprehensive metabolic panel  Result Value Ref Range   Glucose 107 (H) 65 - 99 mg/dL   BUN 12 8 - 27 mg/dL   Creatinine, Ser 0.65 0.57 - 1.00 mg/dL   GFR calc non Af Amer 92 >59 mL/min/1.73   GFR calc Af Amer 105 >59 mL/min/1.73   BUN/Creatinine Ratio 18 12 - 28   Sodium 140 134 - 144 mmol/L   Potassium 4.3 3.5 - 5.2 mmol/L   Chloride 100 96 - 106 mmol/L   CO2 25 20 - 29 mmol/L   Calcium 9.9 8.7 - 10.3 mg/dL   Total Protein 7.2 6.0 - 8.5 g/dL   Albumin 4.2 3.6 - 4.8 g/dL   Globulin, Total 3.0 1.5 - 4.5 g/dL   Albumin/Globulin Ratio 1.4 1.2 - 2.2   Bilirubin Total 0.4 0.0 - 1.2 mg/dL   Alkaline Phosphatase 62 39 - 117 IU/L   AST 31 0 - 40 IU/L   ALT 44 (H) 0 - 32 IU/L  Lipid Panel w/o Chol/HDL  Ratio  Result Value Ref Range    Cholesterol, Total 228 (H) 100 - 199 mg/dL   Triglycerides 203 (H) 0 - 149 mg/dL   HDL 54 >39 mg/dL   VLDL Cholesterol Cal 41 (H) 5 - 40 mg/dL   LDL Calculated 133 (H) 0 - 99 mg/dL  Microalbumin, Urine Waived  Result Value Ref Range   Microalb, Ur Waived 10 0 - 19 mg/L   Creatinine, Urine Waived 50 10 - 300 mg/dL   Microalb/Creat Ratio <30 <30 mg/g  TSH  Result Value Ref Range   TSH 1.820 0.450 - 4.500 uIU/mL  UA/M w/rflx Culture, Routine  Result Value Ref Range   Specific Gravity, UA 1.015 1.005 - 1.030   pH, UA 5.5 5.0 - 7.5   Color, UA Yellow Yellow   Appearance Ur Hazy (A) Clear   Leukocytes, UA Trace (A) Negative   Protein, UA Negative Negative/Trace   Glucose, UA Negative Negative   Ketones, UA Negative Negative   RBC, UA Negative Negative   Bilirubin, UA Negative Negative   Urobilinogen, Ur 0.2 0.2 - 1.0 mg/dL   Nitrite, UA Negative Negative   Microscopic Examination See below:   VITAMIN D 25 Hydroxy (Vit-D Deficiency, Fractures)  Result Value Ref Range   Vit D, 25-Hydroxy 35.1 30.0 - 100.0 ng/mL      Assessment & Plan:   Problem List Items Addressed This Visit      Cardiovascular and Mediastinum   Hypertension - Primary    Under good control on current regimen. Continue current regimen. Continue to monitor. Call with any concerns. Refills given. Labs drawn today.       Relevant Medications   hydrochlorothiazide (HYDRODIURIL) 25 MG tablet   Other Relevant Orders   CBC with Differential/Platelet   Comprehensive metabolic panel   Abdominal aortic atherosclerosis (HCC)    Will keep BP and cholesterol and sugar under good control. Continue to monitor.       Relevant Medications   hydrochlorothiazide (HYDRODIURIL) 25 MG tablet   Other Relevant Orders   CBC with Differential/Platelet   Comprehensive metabolic panel     Endocrine   IFG (impaired fasting glucose)    Rechecking levels today. Call with any concerns. Continue to monitor.       Relevant  Orders   Bayer DCA Hb A1c Waived   CBC with Differential/Platelet   Comprehensive metabolic panel     Other   Hyperlipidemia    Rechecking levels today. Treat as needed. Not fasting. Call with any concerns. Continue to monitor.       Relevant Medications   hydrochlorothiazide (HYDRODIURIL) 25 MG tablet   Other Relevant Orders   CBC with Differential/Platelet   Comprehensive metabolic panel   Lipid Panel w/o Chol/HDL Ratio   Depression    Doing well. No concerns. Off medicine. Continue to monitor.       Anxiety    Doing well. No concerns. Off medicine. Continue to monitor.       Relevant Orders   CBC with Differential/Platelet   Comprehensive metabolic panel   Vitamin D deficiency    Rechecking levels today. Call with any concerns. Continue to monitor.       Relevant Orders   CBC with Differential/Platelet   Comprehensive metabolic panel   VITAMIN D 25 Hydroxy (Vit-D Deficiency, Fractures)    Other Visit Diagnoses    Flank pain       Seems to be due to oblique muscle. Start stretches. Call with any  concerns. Continue to monitor.        Follow up plan: Return in about 6 months (around 06/16/2019) for Physical.

## 2018-12-14 NOTE — Assessment & Plan Note (Signed)
Doing well. No concerns. Off medicine. Continue to monitor.

## 2018-12-14 NOTE — Assessment & Plan Note (Signed)
Rechecking levels today. Call with any concerns. Continue to monitor.  

## 2018-12-14 NOTE — Assessment & Plan Note (Signed)
Rechecking levels today. Treat as needed. Not fasting. Call with any concerns. Continue to monitor.

## 2018-12-14 NOTE — Assessment & Plan Note (Signed)
Under good control on current regimen. Continue current regimen. Continue to monitor. Call with any concerns. Refills given. Labs drawn today.   

## 2018-12-14 NOTE — Assessment & Plan Note (Signed)
Will keep BP and cholesterol and sugar under good control. Continue to monitor.

## 2018-12-15 LAB — LIPID PANEL W/O CHOL/HDL RATIO
CHOLESTEROL TOTAL: 241 mg/dL — AB (ref 100–199)
HDL: 54 mg/dL (ref >39)
LDL CALC: 120 mg/dL — AB (ref 0–99)
TRIGLYCERIDES: 337 mg/dL — AB (ref 0–149)
VLDL Cholesterol Cal: 67 mg/dL — ABNORMAL HIGH (ref 5–40)

## 2018-12-15 LAB — CBC WITH DIFFERENTIAL/PLATELET
Basophils Absolute: 0.1 10*3/uL (ref 0.0–0.2)
Basos: 1 %
EOS (ABSOLUTE): 0.1 10*3/uL (ref 0.0–0.4)
EOS: 1 %
HEMATOCRIT: 47.6 % — AB (ref 34.0–46.6)
HEMOGLOBIN: 16 g/dL — AB (ref 11.1–15.9)
Immature Grans (Abs): 0 10*3/uL (ref 0.0–0.1)
Immature Granulocytes: 0 %
LYMPHS ABS: 3.4 10*3/uL — AB (ref 0.7–3.1)
Lymphs: 43 %
MCH: 30.8 pg (ref 26.6–33.0)
MCHC: 33.6 g/dL (ref 31.5–35.7)
MCV: 92 fL (ref 79–97)
MONOCYTES: 7 %
MONOS ABS: 0.6 10*3/uL (ref 0.1–0.9)
NEUTROS ABS: 3.7 10*3/uL (ref 1.4–7.0)
Neutrophils: 48 %
Platelets: 262 10*3/uL (ref 150–450)
RBC: 5.19 x10E6/uL (ref 3.77–5.28)
RDW: 12.3 % (ref 11.7–15.4)
WBC: 7.8 10*3/uL (ref 3.4–10.8)

## 2018-12-15 LAB — COMPREHENSIVE METABOLIC PANEL
ALBUMIN: 4.3 g/dL (ref 3.8–4.8)
ALK PHOS: 81 IU/L (ref 39–117)
ALT: 39 IU/L — ABNORMAL HIGH (ref 0–32)
AST: 28 IU/L (ref 0–40)
Albumin/Globulin Ratio: 1.4 (ref 1.2–2.2)
BUN / CREAT RATIO: 27 (ref 12–28)
BUN: 17 mg/dL (ref 8–27)
Bilirubin Total: 0.2 mg/dL (ref 0.0–1.2)
CO2: 26 mmol/L (ref 20–29)
CREATININE: 0.62 mg/dL (ref 0.57–1.00)
Calcium: 10 mg/dL (ref 8.7–10.3)
Chloride: 98 mmol/L (ref 96–106)
GFR calc Af Amer: 107 mL/min/{1.73_m2} (ref >59)
GFR calc non Af Amer: 93 mL/min/{1.73_m2} (ref >59)
GLOBULIN, TOTAL: 3 g/dL (ref 1.5–4.5)
Glucose: 84 mg/dL (ref 65–99)
POTASSIUM: 4.3 mmol/L (ref 3.5–5.2)
SODIUM: 140 mmol/L (ref 134–144)
Total Protein: 7.3 g/dL (ref 6.0–8.5)

## 2018-12-15 LAB — VITAMIN D 25 HYDROXY (VIT D DEFICIENCY, FRACTURES): Vit D, 25-Hydroxy: 32.3 ng/mL (ref 30.0–100.0)

## 2018-12-23 ENCOUNTER — Telehealth: Payer: Self-pay | Admitting: Family Medicine

## 2018-12-23 ENCOUNTER — Ambulatory Visit: Payer: Medicare Other | Admitting: Family Medicine

## 2018-12-23 NOTE — Telephone Encounter (Signed)
-   As below

## 2018-12-23 NOTE — Telephone Encounter (Signed)
Copied from [place] [id number]. Topic: Quick Communication - See Telephone Encounter >> Dec 23, 2018 10:06 AM Vernona Rieger wrote: CRM for notification. See Telephone encounter for: 12/23/18. Patient called in this morning during our downtime with epic at around 9 am and stated her daughter had a friend visit for 9 days from [place]. She said that she visited over there on 2 different occasions. The visitor from [place] did not have any sick symptoms - but now her daughter and household are sick. She advised me that she started with a sore throat on Tuesday 12/21/2018 and on Wednesday 12/22/2018 she started with runny nose, dry cough & just feeling bad all around. Reported no fever. The girl from [place] returned back there on Monday, March 9th, 2020. She called and said that she wanted to know if she should come in for an appt - so I was able to get her in at 11 am with Dr Jeananne Rama & called the office to let them know. Per Dr Wynetta Emery - she said the office did not have any test for coronavirus right and directed her to the [facility] - Patient verbalized understanding & said she would call them. Patient called back a few minutes later and advised that the health dept said they do not test for coronavirus & that she did not even meet the criteria because the girl from [place] did not have any symptoms. I called to office & spoke with Christan who spoke back with Dr Wynetta Emery who then advised to call over to [facility] to be tested - gave patient information and she verbalized understanding. Patient said she would follow back up with the office once she gets all of this taken care of.

## 2019-02-14 DIAGNOSIS — D485 Neoplasm of uncertain behavior of skin: Secondary | ICD-10-CM | POA: Diagnosis not present

## 2019-02-14 DIAGNOSIS — Z85828 Personal history of other malignant neoplasm of skin: Secondary | ICD-10-CM | POA: Diagnosis not present

## 2019-02-14 DIAGNOSIS — Z1283 Encounter for screening for malignant neoplasm of skin: Secondary | ICD-10-CM | POA: Diagnosis not present

## 2019-02-14 DIAGNOSIS — L570 Actinic keratosis: Secondary | ICD-10-CM | POA: Diagnosis not present

## 2019-02-14 DIAGNOSIS — L820 Inflamed seborrheic keratosis: Secondary | ICD-10-CM | POA: Diagnosis not present

## 2019-02-14 DIAGNOSIS — L578 Other skin changes due to chronic exposure to nonionizing radiation: Secondary | ICD-10-CM | POA: Diagnosis not present

## 2019-05-16 ENCOUNTER — Ambulatory Visit (INDEPENDENT_AMBULATORY_CARE_PROVIDER_SITE_OTHER): Payer: Medicare Other

## 2019-05-16 VITALS — BP 133/84

## 2019-05-16 DIAGNOSIS — Z0000 Encounter for general adult medical examination without abnormal findings: Secondary | ICD-10-CM | POA: Diagnosis not present

## 2019-05-16 NOTE — Patient Instructions (Signed)
Mary Galvan , Thank you for taking time to come for your Medicare Wellness Visit. I appreciate your ongoing commitment to your health goals. Please review the following plan we discussed and let me know if I can assist you in the future.   Screening recommendations/referrals: Colonoscopy: completed 04/14/2016 Mammogram: Please call [phone] to schedule your mammogram.  Bone Density: completed 04/14/2016 Recommended yearly ophthalmology/optometry visit for glaucoma screening and checkup Recommended yearly dental visit for hygiene and checkup  Vaccinations: Influenza vaccine: up to date Pneumococcal vaccine: up to date Tdap vaccine: up to date Shingles vaccine: up to date    Advanced directives: Please bring a copy of your health care power of attorney and living will to the office at your convenience.  Conditions/risks identified: none  Next appointment: Follow up in one year for your annual wellness exam.    Preventive Care 65 Years and Older, Female Preventive care refers to lifestyle choices and visits with your health care provider that can promote health and wellness. What does preventive care include?  A yearly physical exam. This is also called an annual well check.  Dental exams once or twice a year.  Routine eye exams. Ask your health care provider how often you should have your eyes checked.  Personal lifestyle choices, including:  Daily care of your teeth and gums.  Regular physical activity.  Eating a healthy diet.  Avoiding tobacco and drug use.  Limiting alcohol use.  Practicing safe sex.  Taking low-dose aspirin every day.  Taking vitamin and mineral supplements as recommended by your health care provider. What happens during an annual well check? The services and screenings done by your health care provider during your annual well check will depend on your age, overall health, lifestyle risk factors, and family history of disease. Counseling  Your  health care provider may ask you questions about your:  Alcohol use.  Tobacco use.  Drug use.  Emotional well-being.  Home and relationship well-being.  Sexual activity.  Eating habits.  History of falls.  Memory and ability to understand (cognition).  Work and work Statistician.  Reproductive health. Screening  You may have the following tests or measurements:  Height, weight, and BMI.  Blood pressure.  Lipid and cholesterol levels. These may be checked every 5 years, or more frequently if you are over 17 years old.  Skin check.  Lung cancer screening. You may have this screening every year starting at age 15 if you have a 30-pack-year history of smoking and currently smoke or have quit within the past 15 years.  Fecal occult blood test (FOBT) of the stool. You may have this test every year starting at age 16.  Flexible sigmoidoscopy or colonoscopy. You may have a sigmoidoscopy every 5 years or a colonoscopy every 10 years starting at age 69.  Hepatitis C blood test.  Hepatitis B blood test.  Sexually transmitted disease (STD) testing.  Diabetes screening. This is done by checking your blood sugar (glucose) after you have not eaten for a while (fasting). You may have this done every 1-3 years.  Bone density scan. This is done to screen for osteoporosis. You may have this done starting at age 71.  Mammogram. This may be done every 1-2 years. Talk to your health care provider about how often you should have regular mammograms. Talk with your health care provider about your test results, treatment options, and if necessary, the need for more tests. Vaccines  Your health care provider may recommend certain  vaccines, such as:  Influenza vaccine. This is recommended every year.  Tetanus, diphtheria, and acellular pertussis (Tdap, Td) vaccine. You may need a Td booster every 10 years.  Zoster vaccine. You may need this after age 72.  Pneumococcal 13-valent  conjugate (PCV13) vaccine. One dose is recommended after age 58.  Pneumococcal polysaccharide (PPSV23) vaccine. One dose is recommended after age 74. Talk to your health care provider about which screenings and vaccines you need and how often you need them. This information is not intended to replace advice given to you by your health care provider. Make sure you discuss any questions you have with your health care provider. Document Released: 10/26/2015 Document Revised: 06/18/2016 Document Reviewed: 07/31/2015 Elsevier Interactive Patient Education  2017 [place] Prevention in the Home Falls can cause injuries. They can happen to people of all ages. There are many things you can do to make your home safe and to help prevent falls. What can I do on the outside of my home?  Regularly fix the edges of walkways and driveways and fix any cracks.  Remove anything that might make you trip as you walk through a door, such as a raised step or threshold.  Trim any bushes or trees on the path to your home.  Use bright outdoor lighting.  Clear any walking paths of anything that might make someone trip, such as rocks or tools.  Regularly check to see if handrails are loose or broken. Make sure that both sides of any steps have handrails.  Any raised decks and porches should have guardrails on the edges.  Have any leaves, snow, or ice cleared regularly.  Use sand or salt on walking paths during winter.  Clean up any spills in your garage right away. This includes oil or grease spills. What can I do in the bathroom?  Use night lights.  Install grab bars by the toilet and in the tub and shower. Do not use towel bars as grab bars.  Use non-skid mats or decals in the tub or shower.  If you need to sit down in the shower, use a plastic, non-slip stool.  Keep the floor dry. Clean up any water that spills on the floor as soon as it happens.  Remove soap buildup in the tub or  shower regularly.  Attach bath mats securely with double-sided non-slip rug tape.  Do not have throw rugs and other things on the floor that can make you trip. What can I do in the bedroom?  Use night lights.  Make sure that you have a light by your bed that is easy to reach.  Do not use any sheets or blankets that are too big for your bed. They should not hang down onto the floor.  Have a firm chair that has side arms. You can use this for support while you get dressed.  Do not have throw rugs and other things on the floor that can make you trip. What can I do in the kitchen?  Clean up any spills right away.  Avoid walking on wet floors.  Keep items that you use a lot in easy-to-reach places.  If you need to reach something above you, use a strong step stool that has a grab bar.  Keep electrical cords out of the way.  Do not use floor polish or wax that makes floors slippery. If you must use wax, use non-skid floor wax.  Do not have throw rugs and other things  on the floor that can make you trip. What can I do with my stairs?  Do not leave any items on the stairs.  Make sure that there are handrails on both sides of the stairs and use them. Fix handrails that are broken or loose. Make sure that handrails are as long as the stairways.  Check any carpeting to make sure that it is firmly attached to the stairs. Fix any carpet that is loose or worn.  Avoid having throw rugs at the top or bottom of the stairs. If you do have throw rugs, attach them to the floor with carpet tape.  Make sure that you have a light switch at the top of the stairs and the bottom of the stairs. If you do not have them, ask someone to add them for you. What else can I do to help prevent falls?  Wear shoes that:  Do not have high heels.  Have rubber bottoms.  Are comfortable and fit you well.  Are closed at the toe. Do not wear sandals.  If you use a stepladder:  Make sure that it is fully  opened. Do not climb a closed stepladder.  Make sure that both sides of the stepladder are locked into place.  Ask someone to hold it for you, if possible.  Clearly mark and make sure that you can see:  Any grab bars or handrails.  First and last steps.  Where the edge of each step is.  Use tools that help you move around (mobility aids) if they are needed. These include:  Canes.  Walkers.  Scooters.  Crutches.  Turn on the lights when you go into a dark area. Replace any light bulbs as soon as they burn out.  Set up your furniture so you have a clear path. Avoid moving your furniture around.  If any of your floors are uneven, fix them.  If there are any pets around you, be aware of where they are.  Review your medicines with your doctor. Some medicines can make you feel dizzy. This can increase your chance of falling. Ask your doctor what other things that you can do to help prevent falls. This information is not intended to replace advice given to you by your health care provider. Make sure you discuss any questions you have with your health care provider. Document Released: 07/26/2009 Document Revised: 03/06/2016 Document Reviewed: 11/03/2014 Elsevier Interactive Patient Education  2017 [employer].

## 2019-05-16 NOTE — Progress Notes (Signed)
Subjective:   Mary Galvan is a 68 y.o. female who presents for Medicare Annual (Subsequent) preventive examination.  This visit is being conducted via phone call  - after an attmept to do on video chat - due to the COVID-19 pandemic. This patient has given me verbal consent via phone to conduct this visit, patient states they are participating from their home address. Some vital signs may be absent or patient reported.   Patient identification: identified by name, DOB, and current address.    Review of Systems:  Cardiac Risk Factors include: advanced age (>70men, >84 women);hypertension;dyslipidemia     Objective:     Vitals: BP 133/84 Comment: patient reported  There is no height or weight on file to calculate BMI.  Advanced Directives 05/16/2019 05/12/2018 09/02/2017 05/07/2017 04/21/2016 03/28/2016 03/13/2016  Does Patient Have a Medical Advance Directive? Yes No No No No No No  Type of Advance Directive Living will;Healthcare Power of Attorney - - - - - -  Does patient want to make changes to medical advance directive? - - - - - - -  Copy of [place] in Chart? No - copy requested - - - - - -  Would patient like information on creating a medical advance directive? - Yes (MAU/Ambulatory/Procedural Areas - Information given) No - Patient declined Yes (MAU/Ambulatory/Procedural Areas - Information given) Yes - Educational materials given - -    Tobacco Social History   Tobacco Use  Smoking Status Former Smoker  . Packs/day: 1.00  . Years: 30.00  . Pack years: 30.00  . Types: Cigarettes  . Quit date: 03/13/2000  . Years since quitting: 19.1  Smokeless Tobacco Never Used     Counseling given: Not Answered   Clinical Intake:  Pre-visit preparation completed: Yes  Pain : No/denies pain     Nutritional Risks: None Diabetes: No  How often do you need to have someone help you when you read instructions, pamphlets, or other written materials from your  doctor or pharmacy?: 1 - Never  Interpreter Needed?: No  Information entered by :: Regine Christian,LPN  Past Medical History:  Diagnosis Date  . Anxiety   . Arthritis    thumbs  . Depression   . Fatty liver disease, nonalcoholic   . GERD (gastroesophageal reflux disease)   . Heart murmur   . Hyperlipidemia   . Hypertension   . Kidney cysts   . Motion sickness    reading in cars  . PONV (postoperative nausea and vomiting)    slow to wake  . Rosacea   . Seasonal affective disorder ([place])   . Seasonal allergies   . Seizures ([place])    x1 - after head trauma. 1970's  . Vitreous detachment of left eye July 2012   [facility]   Past Surgical History:  Procedure Laterality Date  . BASAL CELL CARCINOMA EXCISION Left    removed from above left eye   . CHOLECYSTECTOMY  2015  . COLONOSCOPY WITH PROPOFOL N/A 05/10/2015   Procedure: COLONOSCOPY WITH PROPOFOL;  Surgeon: Lucilla Lame, MD;  Location: [facility];  Service: Endoscopy;  Laterality: N/A;  WITH BIOPSY-- SIGMOID COLON POLYP  X  4 DESCENDING COLON POLYP  . DILATATION & CURETTAGE/HYSTEROSCOPY WITH MYOSURE N/A 03/28/2016   Procedure: DILATATION & CURETTAGE/HYSTEROSCOPY WITH MYOSURE;  Surgeon: Boykin Nearing, MD;  Location: [facility];  Service: Gynecology;  Laterality: N/A;  . ESOPHAGOGASTRODUODENOSCOPY (EGD) WITH PROPOFOL N/A 09/02/2017   Procedure: ESOPHAGOGASTRODUODENOSCOPY (EGD)  WITH PROPOFOL;  Surgeon: Lin Landsman, MD;  Location: [facility];  Service: Endoscopy;  Laterality: N/A;  . MELANOMA EXCISION     pre melenoma excision on right foot   . OTHER SURGICAL HISTORY     Uterine Polyp   Family History  Problem Relation Age of Onset  . Cancer Mother        possible lung?  . Dementia Mother   . Cancer Sister        kidney  . Diabetes Sister   . COPD Sister   . Cancer Paternal Grandfather        stomach and prostate  . Diabetes Sister   . Heart disease Sister   . Hypertension Sister    . COPD Sister   . Stroke Maternal Uncle   . Breast cancer Neg Hx    Social History   Socioeconomic History  . Marital status: Married    Spouse name: Not on file  . Number of children: Not on file  . Years of education: Not on file  . Highest education level: High school graduate  Occupational History  . Occupation: retired  Scientific laboratory technician  . Financial resource strain: Not hard at all  . Food insecurity    Worry: Never true    Inability: Never true  . Transportation needs    Medical: No    Non-medical: No  Tobacco Use  . Smoking status: Former Smoker    Packs/day: 1.00    Years: 30.00    Pack years: 30.00    Types: Cigarettes    Quit date: 03/13/2000    Years since quitting: 19.1  . Smokeless tobacco: Never Used  Substance and Sexual Activity  . Alcohol use: Yes    Alcohol/week: 1.0 standard drinks    Types: 1 Glasses of wine per week    Comment: occasional  . Drug use: No  . Sexual activity: Not Currently    Partners: Male  Lifestyle  . Physical activity    Days per week: 0 days    Minutes per session: 0 min  . Stress: Not at all  Relationships  . Social connections    Talks on phone: More than three times a week    Gets together: More than three times a week    Attends religious service: Never    Active member of club or organization: No    Attends meetings of clubs or organizations: Never    Relationship status: Married  Other Topics Concern  . Not on file  Social History Narrative  . Not on file    Outpatient Encounter Medications as of 05/16/2019  Medication Sig  . Calcium 500 MG CHEW Chew by mouth.  . Cholecalciferol (VITAMIN D3) 2000 UNITS TABS Take 2,000 Units by mouth daily.   . hydrochlorothiazide (HYDRODIURIL) 25 MG tablet Take 1 tablet (25 mg total) by mouth daily.  [place] Kitchen thiamine (VITAMIN B-1) 100 MG tablet Take 100 mg by mouth daily.  . Turmeric 500 MG TABS turmeric (bulk)  . B Complex-Biotin-FA (B-50 COMPLEX PO) Take by mouth.  . magnesium 30  MG tablet Take 30 mg by mouth 2 (two) times daily.   No facility-administered encounter medications on file as of 05/16/2019.     Activities of Daily Living In your present state of health, do you have any difficulty performing the following activities: 05/16/2019 05/25/2018  Hearing? N N  Comment no hearing aids -  Vision? N N  Comment wears glasses -  Difficulty concentrating or making decisions? N N  Walking or climbing stairs? Y N  Comment knee pain occasionally -  Dressing or bathing? N N  Doing errands, shopping? N N  Preparing Food and eating ? N -  Using the Toilet? N -  In the past six months, have you accidently leaked urine? N -  Do you have problems with loss of bowel control? N -  Managing your Medications? N -  Managing your Finances? N -  Housekeeping or managing your Housekeeping? N -  Some recent data might be hidden    Patient Care Team: Valerie Roys, DO as PCP - General (Family Medicine) Lucilla Lame, MD as Consulting Physician (Gastroenterology) Schermerhorn, Gwen Her, MD as Referring Physician (Obstetrics and Gynecology) [facility], [facility], [facility] (Dermatology) Lyla Glassing, MD as Referring Physician (Ophthalmology)    Assessment:   This is a routine wellness examination for Ravyn.  Exercise Activities and Dietary recommendations Current Exercise Habits: The patient does not participate in regular exercise at present, Exercise limited by: None identified  Goals    . DIET - INCREASE WATER INTAKE     Recommend drinking at least 6-8 glasses of water a day     . Increase water intake     Recommend drinking at least 5-6 glasses of water a day.        Fall Risk: Fall Risk  05/16/2019 12/14/2018 05/12/2018 05/07/2017 01/26/2017  Falls in the past year? 0 0 No No No    FALL RISK PREVENTION PERTAINING TO THE HOME:  Any stairs in or around the home? Yes  If so, are there any without handrails? No   Home free of loose  throw rugs in walkways, pet beds, electrical cords, etc? Yes  Adequate lighting in your home to reduce risk of falls? Yes   ASSISTIVE DEVICES UTILIZED TO PREVENT FALLS:  Life alert? No  Use of a cane, walker or w/c? No  Grab bars in the bathroom? No  Shower chair or bench in shower? No  Elevated toilet seat or a handicapped toilet? No   DME ORDERS:  DME order needed?  No   TIMED UP AND GO:  Unable to perform    Depression Screen PHQ 2/9 Scores 05/16/2019 05/25/2018 05/12/2018 05/14/2017  PHQ - 2 Score 0 0 0 0  PHQ- 9 Score - 0 - 0     Cognitive Function     6CIT Screen 05/12/2018 05/07/2017  What Year? 0 points 0 points  What month? 0 points 0 points  What time? 0 points 0 points  Count back from 20 0 points 0 points  Months in reverse 0 points 0 points  Repeat phrase 0 points 0 points  Total Score 0 0    Immunization History  Administered Date(s) Administered  . Influenza-Unspecified 08/18/2014, 09/20/2018  . Pneumococcal Conjugate-13 05/03/2015  . Pneumococcal Polysaccharide-23 04/21/2016  . Tdap 05/16/2013  . Zoster Recombinat (Shingrix) 03/09/2018, 06/04/2018    Qualifies for Shingles Vaccine? Completed series   Tdap: up to date   Flu Vaccine: up to date  Pneumococcal Vaccine: up to date  Screening Tests Health Maintenance  Topic Date Due  . MAMMOGRAM  04/14/2018  . INFLUENZA VACCINE  05/14/2019  . COLONOSCOPY  05/09/2020  . TETANUS/TDAP  05/17/2023  . DEXA SCAN  Completed  . Hepatitis C Screening  Completed  . PNA vac Low Risk Adult  Completed    Cancer Screenings:  Colorectal Screening: Completed 05/10/2015  Repeat every 5 years  Mammogram: Completed 04/14/2016. Ordered   Bone Density: Completed 04/14/2016.   Lung Cancer Screening: (Low Dose CT Chest recommended if Age 60-80 years, 30 pack-year currently smoking OR have quit w/in 15years.) does not qualify.     Additional Screening:  Hepatitis C Screening: does qualify; Completed 02/11/2016   Vision Screening: Recommended annual ophthalmology exams for early detection of glaucoma and other disorders of the eye. Is the patient up to date with their annual eye exam?  Yes  Who is the provider or what is the name of the office in which the pt attends annual eye exams? [facility] Screening: Recommended annual dental exams for proper oral hygiene  Community Resource Referral:  CRR required this visit?  No       Plan:  I have personally reviewed and addressed the Medicare Annual Wellness questionnaire and have noted the following in the patient's chart:  A. Medical and social history B. Use of alcohol, tobacco or illicit drugs  C. Current medications and supplements D. Functional ability and status E.  Nutritional status F.  Physical activity G. Advance directives H. List of other physicians I.  Hospitalizations, surgeries, and ER visits in previous 12 months J.  [place] such as hearing and vision if needed, cognitive and depression L. Referrals and appointments   In addition, I have reviewed and discussed with patient certain preventive protocols, quality metrics, and best practice recommendations. A written personalized care plan for preventive services as well as general preventive health recommendations were provided to patient.   Signed,    Bevelyn Ngo, LPN  10/21/4172 Nurse Health Advisor   Nurse Notes: needs refill on hctz

## 2019-05-31 ENCOUNTER — Encounter: Payer: Medicare Other | Admitting: Family Medicine

## 2019-06-10 ENCOUNTER — Other Ambulatory Visit: Payer: Self-pay | Admitting: Family Medicine

## 2019-06-10 DIAGNOSIS — Z1231 Encounter for screening mammogram for malignant neoplasm of breast: Secondary | ICD-10-CM

## 2019-07-22 ENCOUNTER — Ambulatory Visit
Admission: RE | Admit: 2019-07-22 | Discharge: 2019-07-22 | Disposition: A | Discharge status: Home / Self Care | Payer: Medicare Other | Source: Ambulatory Visit | Attending: Family Medicine | Admitting: Family Medicine

## 2019-07-22 DIAGNOSIS — Z1231 Encounter for screening mammogram for malignant neoplasm of breast: Secondary | ICD-10-CM | POA: Diagnosis not present

## 2019-07-25 ENCOUNTER — Encounter: Payer: Self-pay | Admitting: Family Medicine

## 2019-07-25 ENCOUNTER — Other Ambulatory Visit: Payer: Self-pay

## 2019-07-25 ENCOUNTER — Ambulatory Visit (INDEPENDENT_AMBULATORY_CARE_PROVIDER_SITE_OTHER): Payer: Medicare Other | Admitting: Family Medicine

## 2019-07-25 VITALS — BP 134/94 | HR 71 | Temp 98.4°F | Ht 64.5 in | Wt 199.0 lb

## 2019-07-25 DIAGNOSIS — I10 Essential (primary) hypertension: Secondary | ICD-10-CM

## 2019-07-25 DIAGNOSIS — Z23 Encounter for immunization: Secondary | ICD-10-CM | POA: Diagnosis not present

## 2019-07-25 DIAGNOSIS — F419 Anxiety disorder, unspecified: Secondary | ICD-10-CM

## 2019-07-25 DIAGNOSIS — E559 Vitamin D deficiency, unspecified: Secondary | ICD-10-CM

## 2019-07-25 DIAGNOSIS — I700 Atherosclerosis of aorta: Secondary | ICD-10-CM | POA: Diagnosis not present

## 2019-07-25 DIAGNOSIS — Z0000 Encounter for general adult medical examination without abnormal findings: Secondary | ICD-10-CM | POA: Diagnosis not present

## 2019-07-25 DIAGNOSIS — E782 Mixed hyperlipidemia: Secondary | ICD-10-CM | POA: Diagnosis not present

## 2019-07-25 DIAGNOSIS — F3342 Major depressive disorder, recurrent, in full remission: Secondary | ICD-10-CM

## 2019-07-25 DIAGNOSIS — R7301 Impaired fasting glucose: Secondary | ICD-10-CM | POA: Diagnosis not present

## 2019-07-25 LAB — MICROALBUMIN, URINE WAIVED
Creatinine, Urine Waived: 100 mg/dL (ref 10–300)
Microalb, Ur Waived: 10 mg/L (ref 0–19)
Microalb/Creat Ratio: <30 mg/g (ref <30)

## 2019-07-25 LAB — UA/M W/RFLX CULTURE, ROUTINE
Bilirubin, UA: NEGATIVE
Glucose, UA: NEGATIVE
Ketones, UA: NEGATIVE
Leukocytes,UA: NEGATIVE
Nitrite, UA: NEGATIVE
Protein,UA: NEGATIVE
RBC, UA: NEGATIVE
Specific Gravity, UA: 1.025 (ref 1.005–1.030)
Urobilinogen, Ur: 0.2 mg/dL (ref 0.2–1.0)
pH, UA: 6.5 (ref 5.0–7.5)

## 2019-07-25 LAB — BAYER DCA HB A1C WAIVED: HB A1C (BAYER DCA - WAIVED): 6.1 % (ref <7.0)

## 2019-07-25 MED ORDER — HYDROCHLOROTHIAZIDE 25 MG PO TABS: 25.0000 mg | ORAL_TABLET | Freq: Every day | ORAL | 1 refills | Status: DC

## 2019-07-25 NOTE — Progress Notes (Signed)
BP (!) 134/94   Pulse 71   Temp 98.4 F (36.9 C) (Oral)   Ht 5' 4.5" (1.638 m)   Wt 199 lb (90.3 kg)   SpO2 96%   BMI 33.63 kg/m    Subjective:    Patient ID: Mary Galvan, female    DOB: 03-31-1951, 68 y.o.   MRN: [id number]  HPI: Mary Galvan is a 68 y.o. female presenting on 07/25/2019 for comprehensive medical examination. Current medical complaints include:  HYPERTENSION / HYPERLIPIDEMIA Satisfied with current treatment? yes Duration of hypertension: chronic BP monitoring frequency: not checking BP medication side effects: no Past BP meds: HCTZ Duration of hyperlipidemia: chronic Cholesterol medication side effects: not on anything Cholesterol supplements: none Past cholesterol medications: none Medication compliance: excellent compliance Aspirin: no Recent stressors: yes Recurrent headaches: no Visual changes: no Palpitations: no Dyspnea: no Chest pain: no Lower extremity edema: yes Dizzy/lightheaded: no  Impaired Fasting Glucose HbA1C:  Lab Results  Component Value Date   HGBA1C 6.1 12/14/2018   Duration of elevated blood sugar: chronic Polydipsia: no Polyuria: no Weight change: yes Visual disturbance: no Glucose Monitoring: no Diabetic Education: Not Completed Family history of diabetes: no  DEPRESSION Mood status: controlled Satisfied with current treatment?: yes Symptom severity: mild  Duration of current treatment : not on anything Side effects: no Depressed mood: no Anxious mood: no Anhedonia: no Significant weight loss or gain: no Insomnia: no  Fatigue: yes Feelings of worthlessness or guilt: no Impaired concentration/indecisiveness: no Suicidal ideations: no Hopelessness: no Crying spells: no Depression screen [facility] 2/9 07/25/2019 05/16/2019 05/25/2018 05/12/2018 05/14/2017  Decreased Interest 0 0 0 0 0  Down, Depressed, Hopeless 0 0 0 0 0  PHQ - 2 Score 0 0 0 0 0  Altered sleeping 0 - 0 - 0  Tired, decreased energy 0 - 0 -  0  Change in appetite 0 - 0 - 0  Feeling bad or failure about yourself  0 - 0 - 0  Trouble concentrating 0 - 0 - 0  Moving slowly or fidgety/restless 0 - 0 - 0  Suicidal thoughts 0 - 0 - 0  PHQ-9 Score 0 - 0 - 0  Difficult doing work/chores Not difficult at all - Not difficult at all - -   Menopausal Symptoms: no  Depression Screen done today and results listed below:  Depression screen [facility] 2/9 07/25/2019 05/16/2019 05/25/2018 05/12/2018 05/14/2017  Decreased Interest 0 0 0 0 0  Down, Depressed, Hopeless 0 0 0 0 0  PHQ - 2 Score 0 0 0 0 0  Altered sleeping 0 - 0 - 0  Tired, decreased energy 0 - 0 - 0  Change in appetite 0 - 0 - 0  Feeling bad or failure about yourself  0 - 0 - 0  Trouble concentrating 0 - 0 - 0  Moving slowly or fidgety/restless 0 - 0 - 0  Suicidal thoughts 0 - 0 - 0  PHQ-9 Score 0 - 0 - 0  Difficult doing work/chores Not difficult at all - Not difficult at all - -    Past Medical History:  Past Medical History:  Diagnosis Date  . Anxiety   . Arthritis    thumbs  . Depression   . Fatty liver disease, nonalcoholic   . GERD (gastroesophageal reflux disease)   . Heart murmur   . Hyperlipidemia   . Hypertension   . Kidney cysts   . Motion sickness    reading in cars  .  PONV (postoperative nausea and vomiting)    slow to wake  . Rosacea   . Seasonal affective disorder ([place])   . Seasonal allergies   . Seizures ([place])    x1 - after head trauma. 1970's  . Vitreous detachment of left eye July 2012   [facility]    Surgical History:  Past Surgical History:  Procedure Laterality Date  . BASAL CELL CARCINOMA EXCISION Left    removed from above left eye   . CHOLECYSTECTOMY  2015  . COLONOSCOPY WITH PROPOFOL N/A 05/10/2015   Procedure: COLONOSCOPY WITH PROPOFOL;  Surgeon: Lucilla Lame, MD;  Location: [place];  Service: Endoscopy;  Laterality: N/A;  WITH BIOPSY-- SIGMOID COLON POLYP  X  4 DESCENDING COLON POLYP  . DILATATION &  CURETTAGE/HYSTEROSCOPY WITH MYOSURE N/A 03/28/2016   Procedure: DILATATION & CURETTAGE/HYSTEROSCOPY WITH MYOSURE;  Surgeon: Boykin Nearing, MD;  Location: [facility];  Service: Gynecology;  Laterality: N/A;  . ESOPHAGOGASTRODUODENOSCOPY (EGD) WITH PROPOFOL N/A 09/02/2017   Procedure: ESOPHAGOGASTRODUODENOSCOPY (EGD) WITH PROPOFOL;  Surgeon: Lin Landsman, MD;  Location: [place];  Service: Endoscopy;  Laterality: N/A;  . MELANOMA EXCISION     pre melenoma excision on right foot   . OTHER SURGICAL HISTORY     Uterine Polyp    Medications:  Current Outpatient Medications on File Prior to Visit  Medication Sig  . Calcium 500 MG CHEW Chew by mouth.  . Cholecalciferol (VITAMIN D3) 2000 UNITS TABS Take 2,000 Units by mouth daily.   . Turmeric 500 MG TABS turmeric (bulk)   No current facility-administered medications on file prior to visit.     Allergies:  No Known Allergies  Social History:  Social History   Socioeconomic History  . Marital status: Married    Spouse name: Not on file  . Number of children: Not on file  . Years of education: Not on file  . Highest education level: High school graduate  Occupational History  . Occupation: retired  Scientific laboratory technician  . Financial resource strain: Not hard at all  . Food insecurity    Worry: Never true    Inability: Never true  . Transportation needs    Medical: No    Non-medical: No  Tobacco Use  . Smoking status: Former Smoker    Packs/day: 1.00    Years: 30.00    Pack years: 30.00    Types: Cigarettes    Quit date: 03/13/2000    Years since quitting: 19.3  . Smokeless tobacco: Never Used  Substance and Sexual Activity  . Alcohol use: Yes    Alcohol/week: 1.0 standard drinks    Types: 1 Glasses of wine per week    Comment: occasional  . Drug use: No  . Sexual activity: Not Currently    Partners: Male  Lifestyle  . Physical activity    Days per week: 0 days    Minutes per session: 0 min  . Stress:  Not at all  Relationships  . Social connections    Talks on phone: More than three times a week    Gets together: More than three times a week    Attends religious service: Never    Active member of club or organization: No    Attends meetings of clubs or organizations: Never    Relationship status: Married  . Intimate partner violence    Fear of current or ex partner: No    Emotionally abused: No    Physically abused:  No    Forced sexual activity: No  Other Topics Concern  . Not on file  Social History Narrative  . Not on file   Social History   Tobacco Use  Smoking Status Former Smoker  . Packs/day: 1.00  . Years: 30.00  . Pack years: 30.00  . Types: Cigarettes  . Quit date: 03/13/2000  . Years since quitting: 19.3  Smokeless Tobacco Never Used   Social History   Substance and Sexual Activity  Alcohol Use Yes  . Alcohol/week: 1.0 standard drinks  . Types: 1 Glasses of wine per week   Comment: occasional    Family History:  Family History  Problem Relation Age of Onset  . Cancer Mother        possible lung?  . Dementia Mother   . Cancer Sister        kidney  . Diabetes Sister   . COPD Sister   . Cancer Paternal Grandfather        stomach and prostate  . Diabetes Sister   . Heart disease Sister   . Hypertension Sister   . COPD Sister   . Stroke Maternal Uncle   . Breast cancer Neg Hx     Past medical history, surgical history, medications, allergies, family history and social history reviewed with patient today and changes made to appropriate areas of the chart.   Review of Systems  Constitutional: Negative.   HENT: Negative.   Eyes: Negative.   Respiratory: Negative.   Cardiovascular: Positive for leg swelling. Negative for chest pain, palpitations, orthopnea, claudication and PND.  Gastrointestinal: Positive for diarrhea. Negative for abdominal pain, blood in stool, constipation, heartburn, melena, nausea and vomiting.  Genitourinary: Negative.    Musculoskeletal: Negative.   Skin: Negative.   Neurological: Negative.   Endo/Heme/Allergies: Negative.   Psychiatric/Behavioral: Negative.     All other ROS negative except what is listed above and in the HPI.      Objective:    BP (!) 134/94   Pulse 71   Temp 98.4 F (36.9 C) (Oral)   Ht 5' 4.5" (1.638 m)   Wt 199 lb (90.3 kg)   SpO2 96%   BMI 33.63 kg/m   Wt Readings from Last 3 Encounters:  07/25/19 199 lb (90.3 kg)  12/14/18 190 lb (86.2 kg)  05/25/18 193 lb 1 oz (87.6 kg)    Physical Exam Vitals signs and nursing note reviewed.  Constitutional:      General: She is not in acute distress.    Appearance: Normal appearance. She is not ill-appearing, toxic-appearing or diaphoretic.  HENT:     Head: Normocephalic and atraumatic.     Right Ear: Tympanic membrane, ear canal and external ear normal. There is no impacted cerumen.     Left Ear: Tympanic membrane, ear canal and external ear normal. There is no impacted cerumen.     Nose: Nose normal. No congestion or rhinorrhea.     Mouth/Throat:     Mouth: Mucous membranes are moist.     Pharynx: Oropharynx is clear. No oropharyngeal exudate or posterior oropharyngeal erythema.  Eyes:     General: No scleral icterus.       Right eye: No discharge.        Left eye: No discharge.     Extraocular Movements: Extraocular movements intact.     Conjunctiva/sclera: Conjunctivae normal.     Pupils: Pupils are equal, round, and reactive to light.  Neck:     Musculoskeletal: Normal  range of motion and neck supple. No neck rigidity or muscular tenderness.     Vascular: No carotid bruit.  Cardiovascular:     Rate and Rhythm: Normal rate and regular rhythm.     Pulses: Normal pulses.     Heart sounds: No murmur. No friction rub. No gallop.   Pulmonary:     Effort: Pulmonary effort is normal. No respiratory distress.     Breath sounds: Normal breath sounds. No stridor. No wheezing, rhonchi or rales.  Chest:     Chest wall: No  tenderness.  Abdominal:     General: Abdomen is flat. Bowel sounds are normal. There is no distension.     Palpations: Abdomen is soft. There is no mass.     Tenderness: There is no abdominal tenderness. There is no right CVA tenderness, left CVA tenderness, guarding or rebound.     Hernia: No hernia is present.  Genitourinary:    Comments: Breast and pelvic exams deferred with shared decision making Musculoskeletal: Normal range of motion.        General: No swelling, tenderness, deformity or signs of injury.     Right lower leg: No edema.     Left lower leg: No edema.  Lymphadenopathy:     Cervical: No cervical adenopathy.  Skin:    General: Skin is warm and dry.     Capillary Refill: Capillary refill takes less than 2 seconds.     Coloration: Skin is not jaundiced or pale.     Findings: No bruising, erythema, lesion or rash.  Neurological:     General: No focal deficit present.     Mental Status: She is alert and oriented to person, place, and time. Mental status is at baseline.     Cranial Nerves: No cranial nerve deficit.     Sensory: No sensory deficit.     Motor: No weakness.     Coordination: Coordination normal.     Gait: Gait normal.     Deep Tendon Reflexes: Reflexes normal.  Psychiatric:        Mood and Affect: Mood normal.        Behavior: Behavior normal.        Thought Content: Thought content normal.        Judgment: Judgment normal.     Results for orders placed or performed in visit on 12/14/18  Bayer DCA Hb A1c Waived  Result Value Ref Range   HB A1C (BAYER DCA - WAIVED) 6.1 <7.0 %  CBC with Differential/Platelet  Result Value Ref Range   WBC 7.8 3.4 - 10.8 x10E3/uL   RBC 5.19 3.77 - 5.28 x10E6/uL   Hemoglobin 16.0 (H) 11.1 - 15.9 g/dL   Hematocrit 47.6 (H) 34.0 - 46.6 %   MCV 92 79 - 97 fL   MCH 30.8 26.6 - 33.0 pg   MCHC 33.6 31.5 - 35.7 g/dL   RDW 12.3 11.7 - 15.4 %   Platelets 262 150 - 450 x10E3/uL   Neutrophils 48 Not Estab. %   Lymphs 43  Not Estab. %   Monocytes 7 Not Estab. %   Eos 1 Not Estab. %   Basos 1 Not Estab. %   Neutrophils Absolute 3.7 1.4 - 7.0 x10E3/uL   Lymphocytes Absolute 3.4 (H) 0.7 - 3.1 x10E3/uL   Monocytes Absolute 0.6 0.1 - 0.9 x10E3/uL   EOS (ABSOLUTE) 0.1 0.0 - 0.4 x10E3/uL   Basophils Absolute 0.1 0.0 - 0.2 x10E3/uL   Immature Granulocytes 0 Not  Estab. %   Immature Grans (Abs) 0.0 0.0 - 0.1 x10E3/uL  Comprehensive metabolic panel  Result Value Ref Range   Glucose 84 65 - 99 mg/dL   BUN 17 8 - 27 mg/dL   Creatinine, Ser 0.62 0.57 - 1.00 mg/dL   GFR calc non Af Amer 93 >59 mL/min/1.73   GFR calc Af Amer 107 >59 mL/min/1.73   BUN/Creatinine Ratio 27 12 - 28   Sodium 140 134 - 144 mmol/L   Potassium 4.3 3.5 - 5.2 mmol/L   Chloride 98 96 - 106 mmol/L   CO2 26 20 - 29 mmol/L   Calcium 10.0 8.7 - 10.3 mg/dL   Total Protein 7.3 6.0 - 8.5 g/dL   Albumin 4.3 3.8 - 4.8 g/dL   Globulin, Total 3.0 1.5 - 4.5 g/dL   Albumin/Globulin Ratio 1.4 1.2 - 2.2   Bilirubin Total 0.2 0.0 - 1.2 mg/dL   Alkaline Phosphatase 81 39 - 117 IU/L   AST 28 0 - 40 IU/L   ALT 39 (H) 0 - 32 IU/L  Lipid Panel w/o Chol/HDL Ratio  Result Value Ref Range   Cholesterol, Total 241 (H) 100 - 199 mg/dL   Triglycerides 337 (H) 0 - 149 mg/dL   HDL 54 >39 mg/dL   VLDL Cholesterol Cal 67 (H) 5 - 40 mg/dL   LDL Calculated 120 (H) 0 - 99 mg/dL  VITAMIN D 25 Hydroxy (Vit-D Deficiency, Fractures)  Result Value Ref Range   Vit D, 25-Hydroxy 32.3 30.0 - 100.0 ng/mL      Assessment & Plan:   Problem List Items Addressed This Visit      Cardiovascular and Mediastinum   Hypertension    Under good control on current regimen. Continue current regimen. Continue to monitor. Call with any concerns. Refills given. Labs drawn today.        Relevant Medications   hydrochlorothiazide (HYDRODIURIL) 25 MG tablet   Other Relevant Orders   CBC with Differential/Platelet   Comprehensive metabolic panel   Microalbumin, Urine Waived   TSH    UA/M w/rflx Culture, Routine   Abdominal aortic atherosclerosis (HCC)    Will keep BP, cholesterol and sugars under good control. Continue to monitor. Call with any concerns.        Relevant Medications   hydrochlorothiazide (HYDRODIURIL) 25 MG tablet   Other Relevant Orders   CBC with Differential/Platelet   Comprehensive metabolic panel   Lipid Panel w/o Chol/HDL Ratio   TSH   UA/M w/rflx Culture, Routine     Endocrine   IFG (impaired fasting glucose)    Under good control with A1c of 6.1- continue to monitor. Call with any concerns.       Relevant Orders   Bayer DCA Hb A1c Waived   CBC with Differential/Platelet   Comprehensive metabolic panel   TSH   UA/M w/rflx Culture, Routine     Other   Hyperlipidemia    Under good control on current regimen. Continue current regimen. Continue to monitor. Call with any concerns. Refills given. Labs drawn today.       Relevant Medications   hydrochlorothiazide (HYDRODIURIL) 25 MG tablet   Other Relevant Orders   CBC with Differential/Platelet   Comprehensive metabolic panel   Lipid Panel w/o Chol/HDL Ratio   TSH   UA/M w/rflx Culture, Routine   Depression    Under good control off medication. Continue to monitor. Call with any concerns. Continue to monitor.       Relevant Orders  CBC with Differential/Platelet   Comprehensive metabolic panel   TSH   UA/M w/rflx Culture, Routine   Anxiety    Under good control off medication. Continue to monitor. Call with any concerns. Continue to monitor.       Relevant Orders   CBC with Differential/Platelet   Comprehensive metabolic panel   TSH   UA/M w/rflx Culture, Routine   Vitamin D deficiency    Rechecking levels today. Await results. Call with any concerns.       Relevant Orders   CBC with Differential/Platelet   Comprehensive metabolic panel   TSH   UA/M w/rflx Culture, Routine   VITAMIN D 25 Hydroxy (Vit-D Deficiency, Fractures)    Other Visit Diagnoses     Routine general medical examination at a health care facility    -  Primary   Vaccines up to date. Screening labs checked today. Pap N/A. Mammogram, DEXA and colonoscopy up to date. Continue diet and exercise. Call with any concerns.    Flu vaccine need       Flu shot given today.   Relevant Orders   Flu Vaccine QUAD High Dose(Fluad) (Completed)       Follow up plan: Return in about 6 months (around 01/23/2020).   LABORATORY TESTING:  - Pap smear: not applicable  IMMUNIZATIONS:   - Tdap: Tetanus vaccination status reviewed: last tetanus booster within 10 years. - Influenza: Administered today - Pneumovax: Up to date - Prevnar: Up to date  SCREENING: -Mammogram: Up to date  - Colonoscopy: Up to date  - Bone Density: Up to date   PATIENT COUNSELING:   Advised to take 1 mg of folate supplement per day if capable of pregnancy.   Sexuality: Discussed sexually transmitted diseases, partner selection, use of condoms, avoidance of unintended pregnancy  and contraceptive alternatives.   Advised to avoid cigarette smoking.  I discussed with the patient that most people either abstain from alcohol or drink within safe limits (<=14/week and <=4 drinks/occasion for males, <=7/weeks and <= 3 drinks/occasion for females) and that the risk for alcohol disorders and other health effects rises proportionally with the number of drinks per week and how often a drinker exceeds daily limits.  Discussed cessation/primary prevention of drug use and availability of treatment for abuse.   Diet: Encouraged to adjust caloric intake to maintain  or achieve ideal body weight, to reduce intake of dietary saturated fat and total fat, to limit sodium intake by avoiding high sodium foods and not adding table salt, and to maintain adequate dietary potassium and calcium preferably from fresh fruits, vegetables, and low-fat dairy products.    stressed the importance of regular exercise  Injury prevention:  Discussed safety belts, safety helmets, smoke detector, smoking near bedding or upholstery.   Dental health: Discussed importance of regular tooth brushing, flossing, and dental visits.    NEXT PREVENTATIVE PHYSICAL DUE IN 1 YEAR. Return in about 6 months (around 01/23/2020).

## 2019-07-25 NOTE — Assessment & Plan Note (Signed)
Under good control with A1c of 6.1- continue to monitor. Call with any concerns.

## 2019-07-25 NOTE — Assessment & Plan Note (Signed)
Under good control on current regimen. Continue current regimen. Continue to monitor. Call with any concerns. Refills given. Labs drawn today.   

## 2019-07-25 NOTE — Assessment & Plan Note (Signed)
Under good control off medication. Continue to monitor. Call with any concerns. Continue to monitor.  

## 2019-07-25 NOTE — Assessment & Plan Note (Signed)
Rechecking levels today. Await results. Call with any concerns.  

## 2019-07-25 NOTE — Assessment & Plan Note (Signed)
Will keep BP, cholesterol and sugars under good control. Continue to monitor. Call with any concerns.  

## 2019-07-26 LAB — CBC WITH DIFFERENTIAL/PLATELET
Basophils Absolute: 0 10*3/uL (ref 0.0–0.2)
Basos: 0 %
EOS (ABSOLUTE): 0.1 10*3/uL (ref 0.0–0.4)
Eos: 1 %
Hematocrit: 47.4 % — ABNORMAL HIGH (ref 34.0–46.6)
Hemoglobin: 15.7 g/dL (ref 11.1–15.9)
Immature Grans (Abs): 0 10*3/uL (ref 0.0–0.1)
Immature Granulocytes: 0 %
Lymphocytes Absolute: 2.8 10*3/uL (ref 0.7–3.1)
Lymphs: 38 %
MCH: 31 pg (ref 26.6–33.0)
MCHC: 33.1 g/dL (ref 31.5–35.7)
MCV: 94 fL (ref 79–97)
Monocytes Absolute: 0.5 10*3/uL (ref 0.1–0.9)
Monocytes: 7 %
Neutrophils Absolute: 4 10*3/uL (ref 1.4–7.0)
Neutrophils: 54 %
Platelets: 258 10*3/uL (ref 150–450)
RBC: 5.07 x10E6/uL (ref 3.77–5.28)
RDW: 12.8 % (ref 11.7–15.4)
WBC: 7.3 10*3/uL (ref 3.4–10.8)

## 2019-07-26 LAB — LIPID PANEL W/O CHOL/HDL RATIO
Cholesterol, Total: 224 mg/dL — ABNORMAL HIGH (ref 100–199)
HDL: 48 mg/dL (ref >39)
LDL Chol Calc (NIH): 131 mg/dL — ABNORMAL HIGH (ref 0–99)
Triglycerides: 253 mg/dL — ABNORMAL HIGH (ref 0–149)
VLDL Cholesterol Cal: 45 mg/dL — ABNORMAL HIGH (ref 5–40)

## 2019-07-26 LAB — COMPREHENSIVE METABOLIC PANEL
ALT: 33 IU/L — ABNORMAL HIGH (ref 0–32)
AST: 25 IU/L (ref 0–40)
Albumin/Globulin Ratio: 1.7 (ref 1.2–2.2)
Albumin: 4.2 g/dL (ref 3.8–4.8)
Alkaline Phosphatase: 74 IU/L (ref 39–117)
BUN/Creatinine Ratio: 26 (ref 12–28)
BUN: 15 mg/dL (ref 8–27)
Bilirubin Total: 0.3 mg/dL (ref 0.0–1.2)
CO2: 23 mmol/L (ref 20–29)
Calcium: 9.5 mg/dL (ref 8.7–10.3)
Chloride: 98 mmol/L (ref 96–106)
Creatinine, Ser: 0.57 mg/dL (ref 0.57–1.00)
GFR calc Af Amer: 109 mL/min/{1.73_m2} (ref >59)
GFR calc non Af Amer: 95 mL/min/{1.73_m2} (ref >59)
Globulin, Total: 2.5 g/dL (ref 1.5–4.5)
Glucose: 100 mg/dL — ABNORMAL HIGH (ref 65–99)
Potassium: 4 mmol/L (ref 3.5–5.2)
Sodium: 139 mmol/L (ref 134–144)
Total Protein: 6.7 g/dL (ref 6.0–8.5)

## 2019-07-26 LAB — VITAMIN D 25 HYDROXY (VIT D DEFICIENCY, FRACTURES): Vit D, 25-Hydroxy: 33.6 ng/mL (ref 30.0–100.0)

## 2019-07-26 LAB — TSH: TSH: 2.35 u[IU]/mL (ref 0.450–4.500)

## 2019-10-17 ENCOUNTER — Telehealth: Payer: Self-pay | Admitting: Family Medicine

## 2019-10-17 NOTE — Chronic Care Management (AMB) (Signed)
  Chronic Care Management   Outreach Note  10/17/2019 Name: Mary Galvan MRN: [id number] DOB: [birth year]  Referred by: Valerie Roys, DO Reason for referral : Chronic Care Management (Initial CCM outreach was unsuccessful. )   An unsuccessful telephone outreach was attempted today. The patient was referred to the case management team by for assistance with care management and care coordination.   Follow Up Plan: A HIPPA compliant phone message was left for the patient providing contact information and requesting a return call.  The care management team will reach out to the patient again over the next 7 days.  If patient returns call to provider office, please advise to call [facility] at [place], [place] [facility]  [place], [place] [address] Direct Dial: [email or web address]  Website: [email or web address]

## 2019-10-26 NOTE — Chronic Care Management (AMB) (Signed)
  Chronic Care Management   Note  10/26/2019 Name: AMERY VANDENBOS MRN: [id number] DOB: 06/01/1951  Mary Galvan is a 69 y.o. year old female who is a primary care patient of Valerie Roys, DO. I reached out to [name] by phone today in response to a referral sent by Ms. Tollie Pizza Hostetter's health plan.     Ms. Laning was given information about Chronic Care Management services today including:  1. CCM service includes personalized support from designated clinical staff supervised by her physician, including individualized plan of care and coordination with other care providers 2. 24/7 contact phone numbers for assistance for urgent and routine care needs. 3. Service will only be billed when office clinical staff spend 20 minutes or more in a month to coordinate care. 4. Only one practitioner may furnish and bill the service in a calendar month. 5. The patient may stop CCM services at any time (effective at the end of the month) by phone call to the office staff. 6. The patient will be responsible for cost sharing (co-pay) of up to 20% of the service fee (after annual deductible is met).  Patient agreed to services and verbal consent obtained.   Follow up plan: Telephone appointment with care management team member scheduled for: 11/09/2019  [place], [place] [facility]  [place], [place] [address] Direct Dial: [email or web address]  Website: [email or web address]

## 2019-11-09 ENCOUNTER — Telehealth: Payer: Medicare Other

## 2019-11-09 ENCOUNTER — Telehealth: Payer: Medicare Other | Admitting: General Practice

## 2019-11-09 ENCOUNTER — Ambulatory Visit (INDEPENDENT_AMBULATORY_CARE_PROVIDER_SITE_OTHER): Payer: Medicare Other | Admitting: General Practice

## 2019-11-09 ENCOUNTER — Telehealth: Payer: Self-pay | Admitting: Family Medicine

## 2019-11-09 DIAGNOSIS — I10 Essential (primary) hypertension: Secondary | ICD-10-CM

## 2019-11-09 DIAGNOSIS — E782 Mixed hyperlipidemia: Secondary | ICD-10-CM | POA: Diagnosis not present

## 2019-11-09 DIAGNOSIS — I700 Atherosclerosis of aorta: Secondary | ICD-10-CM

## 2019-11-09 NOTE — Chronic Care Management (AMB) (Signed)
  Care Management   Note  11/09/2019 Name: Mary Galvan MRN: [id number] DOB: 03/01/1951  Mary Galvan is a 69 y.o. year old female who is a primary care patient of Valerie Roys, DO and is actively engaged with the care management team. I reached out to [name] by phone today to assist with re-scheduling an initial visit with the RN Case Manager  Follow up plan: Telephone appointment with care management team member scheduled for:11/09/2019 ( Time changed to 3:30)   [facility] Lar[facility], [place], [place], [place] [address] Direct Dial: [phone] [email or web address] Website: [email or web address]

## 2019-11-09 NOTE — Patient Instructions (Signed)
Visit Information  Goals Addressed            This Visit's Progress   . RNCM: Sometimes my blood pressure is high       Current Barriers:  . Chronic Disease Management support, education, and care coordination needs related to HTN, HLD, and Abdominal Aortic Atherosclerosis   Clinical Goal(s) related to HTN, HLD, and Abdominal Aortic Atherosclerosis :  Over the next 60 days, patient will:  . Work with the care management team to address educational, disease management, and care coordination needs  . Begin or continue self health monitoring activities as directed today Measure and record blood pressure 2 or more times per week . Call provider office for new or worsened signs and symptoms Blood pressure findings outside established parameters and New or worsened symptom related to chronic medical conditions . Call care management team with questions or concerns . Verbalize basic understanding of patient centered plan of care established today  Interventions related to HTN, HLD, and Abdominal Aortic Atherosclerosis :  . Evaluation of current treatment plans and patient's adherence to plan as established by provider . Assessed patient understanding of disease states . Assessed patient's education and care coordination needs . Provided disease specific education to patient  . Collaborated with appropriate clinical care team members regarding patient needs . Review of Heart Healthy Diet and eating habits . Evaluation of activity levels and weight management  Patient Self Care Activities related to : HTN, HLD, and Abdominal Aortic Atherosclerosis  . Patient is unable to independently self-manage chronic health conditions  Initial goal documentation        Ms. Scotti was given information about Chronic Care Management services today including:  1. CCM service includes personalized support from designated clinical staff supervised by her physician, including individualized plan of care  and coordination with other care providers 2. 24/7 contact phone numbers for assistance for urgent and routine care needs. 3. Service will only be billed when office clinical staff spend 20 minutes or more in a month to coordinate care. 4. Only one practitioner may furnish and bill the service in a calendar month. 5. The patient may stop CCM services at any time (effective at the end of the month) by phone call to the office staff. 6. The patient will be responsible for cost sharing (co-pay) of up to 20% of the service fee (after annual deductible is met).  Patient agreed to services and verbal consent obtained.   The patient verbalized understanding of instructions provided today and declined a print copy of patient instruction materials.   Telephone follow up appointment with care management team member scheduled for:01-03-2020 at 1 pm  Between, MSN, [facility] Mobile: [phone]  DASH Eating Plan DASH stands for "Dietary Approaches to Stop Hypertension." The DASH eating plan is a healthy eating plan that has been shown to reduce high blood pressure (hypertension). It may also reduce your risk for type 2 diabetes, heart disease, and stroke. The DASH eating plan may also help with weight loss. What are tips for following this plan?  General guidelines  Avoid eating more than 2,300 mg (milligrams) of salt (sodium) a day. If you have hypertension, you may need to reduce your sodium intake to 1,500 mg a day.  Limit alcohol intake to no more than 1 drink a day for nonpregnant women and 2 drinks a day for men. One drink equals 12 oz of  beer, 5 oz of wine, or 1 oz of hard liquor.  Work with your health care provider to maintain a healthy body weight or to lose weight. Ask what an ideal weight is for you.  Get at least 30 minutes of exercise that causes your heart to beat faster (aerobic exercise) most  days of the week. Activities may include walking, swimming, or biking.  Work with your health care provider or diet and nutrition specialist (dietitian) to adjust your eating plan to your individual calorie needs. Reading food labels   Check food labels for the amount of sodium per serving. Choose foods with less than 5 percent of the Daily Value of sodium. Generally, foods with less than 300 mg of sodium per serving fit into this eating plan.  To find whole grains, look for the word "whole" as the first word in the ingredient list. Shopping  Buy products labeled as "low-sodium" or "no salt added."  Buy fresh foods. Avoid canned foods and premade or frozen meals. Cooking  Avoid adding salt when cooking. Use salt-free seasonings or herbs instead of table salt or sea salt. Check with your health care provider or pharmacist before using salt substitutes.  Do not fry foods. Cook foods using healthy methods such as baking, boiling, grilling, and broiling instead.  Cook with heart-healthy oils, such as olive, canola, soybean, or sunflower oil. Meal planning  Eat a balanced diet that includes: ? 5 or more servings of fruits and vegetables each day. At each meal, try to fill half of your plate with fruits and vegetables. ? Up to 6-8 servings of whole grains each day. ? Less than 6 oz of lean meat, poultry, or fish each day. A 3-oz serving of meat is about the same size as a deck of cards. One egg equals 1 oz. ? 2 servings of low-fat dairy each day. ? A serving of nuts, seeds, or beans 5 times each week. ? Heart-healthy fats. Healthy fats called Omega-3 fatty acids are found in foods such as flaxseeds and coldwater fish, like sardines, salmon, and mackerel.  Limit how much you eat of the following: ? Canned or prepackaged foods. ? Food that is high in trans fat, such as fried foods. ? Food that is high in saturated fat, such as fatty meat. ? Sweets, desserts, sugary drinks, and other foods  with added sugar. ? Full-fat dairy products.  Do not salt foods before eating.  Try to eat at least 2 vegetarian meals each week.  Eat more home-cooked food and less restaurant, buffet, and fast food.  When eating at a restaurant, ask that your food be prepared with less salt or no salt, if possible. What foods are recommended? The items listed may not be a complete list. Talk with your dietitian about what dietary choices are best for you. Grains Whole-grain or whole-wheat bread. Whole-grain or whole-wheat pasta. Brown rice. [place]. Bulgur. Whole-grain and low-sodium cereals. Pita bread. Low-fat, low-sodium crackers. Whole-wheat flour tortillas. Vegetables Fresh or frozen vegetables (raw, steamed, roasted, or grilled). Low-sodium or reduced-sodium tomato and vegetable juice. Low-sodium or reduced-sodium tomato sauce and tomato paste. Low-sodium or reduced-sodium canned vegetables. Fruits All fresh, dried, or frozen fruit. Canned fruit in natural juice (without added sugar). Meat and other protein foods Skinless chicken or [place]. Ground chicken or [place]. Pork with fat trimmed off. Fish and seafood. Egg whites. Dried beans, peas, or lentils. Unsalted nuts, nut butters, and seeds. Unsalted canned beans. Lean cuts of beef with fat  trimmed off. Low-sodium, lean deli meat. Dairy Low-fat (1%) or fat-free (skim) milk. Fat-free, low-fat, or reduced-fat cheeses. Nonfat, low-sodium ricotta or cottage cheese. Low-fat or nonfat yogurt. Low-fat, low-sodium cheese. Fats and oils Soft margarine without trans fats. Vegetable oil. Low-fat, reduced-fat, or light mayonnaise and salad dressings (reduced-sodium). Canola, safflower, olive, soybean, and sunflower oils. Avocado. Seasoning and other foods Herbs. Spices. Seasoning mixes without salt. Unsalted popcorn and pretzels. Fat-free sweets. What foods are not recommended? The items listed may not be a complete list. Talk with your dietitian about  what dietary choices are best for you. Grains Baked goods made with fat, such as croissants, muffins, or some breads. Dry pasta or rice meal packs. Vegetables Creamed or fried vegetables. Vegetables in a cheese sauce. Regular canned vegetables (not low-sodium or reduced-sodium). Regular canned tomato sauce and paste (not low-sodium or reduced-sodium). Regular tomato and vegetable juice (not low-sodium or reduced-sodium). Angie Fava. Olives. Fruits Canned fruit in a light or heavy syrup. Fried fruit. Fruit in cream or butter sauce. Meat and other protein foods Fatty cuts of meat. Ribs. Fried meat. Berniece Salines. Sausage. Bologna and other processed lunch meats. Salami. Fatback. Hotdogs. Bratwurst. Salted nuts and seeds. Canned beans with added salt. Canned or smoked fish. Whole eggs or egg yolks. Chicken or [place] with skin. Dairy Whole or 2% milk, cream, and half-and-half. Whole or full-fat cream cheese. Whole-fat or sweetened yogurt. Full-fat cheese. Nondairy creamers. Whipped toppings. Processed cheese and cheese spreads. Fats and oils Butter. Stick margarine. Lard. Shortening. Ghee. Bacon fat. Tropical oils, such as coconut, palm kernel, or palm oil. Seasoning and other foods Salted popcorn and pretzels. Onion salt, garlic salt, seasoned salt, table salt, and sea salt. Worcestershire sauce. Tartar sauce. Barbecue sauce. Teriyaki sauce. Soy sauce, including reduced-sodium. Steak sauce. Canned and packaged gravies. Fish sauce. Oyster sauce. Cocktail sauce. Horseradish that you find on the shelf. Ketchup. Mustard. Meat flavorings and tenderizers. Bouillon cubes. Hot sauce and Tabasco sauce. Premade or packaged marinades. Premade or packaged taco seasonings. Relishes. Regular salad dressings. Where to find more information:  National Heart, Lung, and [place]: https://wilson-eaton.com/  American Heart Association: www.heart.org Summary  The DASH eating plan is a healthy eating plan that has been shown to  reduce high blood pressure (hypertension). It may also reduce your risk for type 2 diabetes, heart disease, and stroke.  With the DASH eating plan, you should limit salt (sodium) intake to 2,300 mg a day. If you have hypertension, you may need to reduce your sodium intake to 1,500 mg a day.  When on the DASH eating plan, aim to eat more fresh fruits and vegetables, whole grains, lean proteins, low-fat dairy, and heart-healthy fats.  Work with your health care provider or diet and nutrition specialist (dietitian) to adjust your eating plan to your individual calorie needs. This information is not intended to replace advice given to you by your health care provider. Make sure you discuss any questions you have with your health care provider. Document Revised: 09/11/2017 Document Reviewed: 09/22/2016 Elsevier Patient Education  2020 [employer].

## 2019-11-09 NOTE — Chronic Care Management (AMB) (Signed)
Chronic Care Management   Initial Visit Note  11/09/2019 Name: Mary Galvan MRN: [id number] DOB: 08-Oct-1951  Referred by: Valerie Roys, DO Reason for referral : Chronic Care Management (RNCM Chronic Disease Management and Care Coordiantion Needs)   Mary Galvan is a 69 y.o. year old female who is a primary care patient of Valerie Roys, DO. The CCM team was consulted for assistance with chronic disease management and care coordination needs related to HTN, HLD and Abdominal Aortic Atherosclerosis   Review of patient status, including review of consultants reports, relevant laboratory and other test results, and collaboration with appropriate care team members and the patient's provider was performed as part of comprehensive patient evaluation and provision of chronic care management services.    SDOH (Social Determinants of Health) screening performed today: Pharmacist, hospital Insecurity  Tobacco Use Physical Activity. See Care Plan for related entries.   Medications: Outpatient Encounter Medications as of 11/09/2019  Medication Sig  . Cholecalciferol (VITAMIN D3) 2000 UNITS TABS Take 2,000 Units by mouth daily.   . hydrochlorothiazide (HYDRODIURIL) 25 MG tablet Take 1 tablet (25 mg total) by mouth daily.  . Turmeric 500 MG TABS turmeric (bulk)  . Calcium 500 MG CHEW Chew by mouth.   No facility-administered encounter medications on file as of 11/09/2019.     Objective:   Goals Addressed            This Visit's Progress   . RNCM: Sometimes my blood pressure is high       Current Barriers:  . Chronic Disease Management support, education, and care coordination needs related to HTN, HLD, and Abdominal Aortic Atherosclerosis   Clinical Goal(s) related to HTN, HLD, and Abdominal Aortic Atherosclerosis :  Over the next 60 days, patient will:  . Work with the care management team to address educational, disease management, and care coordination needs   . Begin or continue self health monitoring activities as directed today Measure and record blood pressure 2 or more times per week . Call provider office for new or worsened signs and symptoms Blood pressure findings outside established parameters and New or worsened symptom related to chronic medical conditions . Call care management team with questions or concerns . Verbalize basic understanding of patient centered plan of care established today  Interventions related to HTN, HLD, and Abdominal Aortic Atherosclerosis :  . Evaluation of current treatment plans and patient's adherence to plan as established by provider . Assessed patient understanding of disease states . Assessed patient's education and care coordination needs . Provided disease specific education to patient  . Collaborated with appropriate clinical care team members regarding patient needs . Review of Heart Healthy Diet and eating habits . Evaluation of activity levels and weight management  Patient Self Care Activities related to : HTN, HLD, and Abdominal Aortic Atherosclerosis  . Patient is unable to independently self-manage chronic health conditions  Initial goal documentation         Mary Galvan was given information about Chronic Care Management services today including:  1. CCM service includes personalized support from designated clinical staff supervised by her physician, including individualized plan of care and coordination with other care providers 2. 24/7 contact phone numbers for assistance for urgent and routine care needs. 3. Service will only be billed when office clinical staff spend 20 minutes or more in a month to coordinate care. 4. Only one practitioner may furnish and bill the service in a calendar month. 5. The  patient may stop CCM services at any time (effective at the end of the month) by phone call to the office staff. 6. The patient will be responsible for cost sharing (co-pay) of up to  20% of the service fee (after annual deductible is met).  Patient agreed to services and verbal consent obtained.   Plan:   Telephone follow up appointment with care management team member scheduled for: 01-03-2020 at 1 pm  Noreene Larsson RN, MSN, [facility] Mobile: [phone]

## 2019-11-21 ENCOUNTER — Other Ambulatory Visit: Payer: Self-pay | Admitting: Family Medicine

## 2019-11-21 NOTE — Telephone Encounter (Signed)
Requested Prescriptions  Pending Prescriptions Disp Refills  . hydrochlorothiazide (HYDRODIURIL) 25 MG tablet [Pharmacy Med Name: HYDROCHLOROTHIAZIDE 25 MG TAB] 90 tablet 1    Sig: TAKE 1 TABLET BY MOUTH DAILY     Cardiovascular: Diuretics - Thiazide Failed - 11/21/2019 12:30 PM      Failed - Last BP in normal range    BP Readings from Last 1 Encounters:  07/25/19 (!) 134/94         Passed - Ca in normal range and within 360 days    Calcium  Date Value Ref Range Status  07/25/2019 9.5 8.7 - 10.3 mg/dL Final         Passed - Cr in normal range and within 360 days    Creatinine, Ser  Date Value Ref Range Status  07/25/2019 0.57 0.57 - 1.00 mg/dL Final         Passed - K in normal range and within 360 days    Potassium  Date Value Ref Range Status  07/25/2019 4.0 3.5 - 5.2 mmol/L Final         Passed - Na in normal range and within 360 days    Sodium  Date Value Ref Range Status  07/25/2019 139 134 - 144 mmol/L Final         Passed - Valid encounter within last 6 months    Recent Outpatient Visits          3 months ago Routine general medical examination at a health care facility   [facility], [place], DO   11 months ago Essential hypertension   [place], [place], DO   1 year ago Routine general medical examination at a health care facility   [facility], [place], DO   2 years ago Pill dysphagia   [place], Megan P, DO   2 years ago Routine general medical examination at a health care facility   [place], Barb Merino, DO      Future Appointments            In 2 months Wynetta Emery, Barb Merino, DO [facility], [place]

## 2020-01-03 ENCOUNTER — Telehealth: Payer: Self-pay

## 2020-01-03 ENCOUNTER — Ambulatory Visit: Payer: Self-pay | Admitting: General Practice

## 2020-01-03 NOTE — Chronic Care Management (AMB) (Signed)
  Chronic Care Management   Outreach Note  01/03/2020 Name: Mary Galvan MRN: [id number] DOB: 06/11/1951  Referred by: Valerie Roys, DO Reason for referral : Chronic Care Management (Follow up on HTN, HLD, Aoretic Atheroscleroisis)   An unsuccessful telephone outreach was attempted today. The patient was referred to the case management team for assistance with care management and care coordination.   Follow Up Plan: A HIPPA compliant phone message was left for the patient providing contact information and requesting a return call.   Noreene Larsson RN, MSN, [facility] Mobile: [phone]

## 2020-01-23 ENCOUNTER — Ambulatory Visit (INDEPENDENT_AMBULATORY_CARE_PROVIDER_SITE_OTHER): Payer: Medicare Other | Admitting: Family Medicine

## 2020-01-23 ENCOUNTER — Encounter: Payer: Self-pay | Admitting: Family Medicine

## 2020-01-23 ENCOUNTER — Other Ambulatory Visit: Payer: Self-pay

## 2020-01-23 VITALS — BP 130/81 | HR 75 | Temp 97.8°F | Wt 191.0 lb

## 2020-01-23 DIAGNOSIS — I10 Essential (primary) hypertension: Secondary | ICD-10-CM | POA: Diagnosis not present

## 2020-01-23 DIAGNOSIS — E6609 Other obesity due to excess calories: Secondary | ICD-10-CM

## 2020-01-23 DIAGNOSIS — E559 Vitamin D deficiency, unspecified: Secondary | ICD-10-CM

## 2020-01-23 DIAGNOSIS — I700 Atherosclerosis of aorta: Secondary | ICD-10-CM

## 2020-01-23 DIAGNOSIS — F419 Anxiety disorder, unspecified: Secondary | ICD-10-CM

## 2020-01-23 DIAGNOSIS — R7301 Impaired fasting glucose: Secondary | ICD-10-CM

## 2020-01-23 DIAGNOSIS — K760 Fatty (change of) liver, not elsewhere classified: Secondary | ICD-10-CM | POA: Diagnosis not present

## 2020-01-23 DIAGNOSIS — Z6832 Body mass index (BMI) 32.0-32.9, adult: Secondary | ICD-10-CM

## 2020-01-23 DIAGNOSIS — F3342 Major depressive disorder, recurrent, in full remission: Secondary | ICD-10-CM

## 2020-01-23 DIAGNOSIS — E782 Mixed hyperlipidemia: Secondary | ICD-10-CM | POA: Diagnosis not present

## 2020-01-23 LAB — UA/M W/RFLX CULTURE, ROUTINE
Bilirubin, UA: NEGATIVE
Glucose, UA: NEGATIVE
Ketones, UA: NEGATIVE
Leukocytes,UA: NEGATIVE
Nitrite, UA: NEGATIVE
Protein,UA: NEGATIVE
RBC, UA: NEGATIVE
Specific Gravity, UA: 1.01 (ref 1.005–1.030)
Urobilinogen, Ur: 0.2 mg/dL (ref 0.2–1.0)
pH, UA: 6 (ref 5.0–7.5)

## 2020-01-23 LAB — MICROALBUMIN, URINE WAIVED
Creatinine, Urine Waived: 50 mg/dL (ref 10–300)
Microalb, Ur Waived: 10 mg/L (ref 0–19)
Microalb/Creat Ratio: <30 mg/g (ref <30)

## 2020-01-23 LAB — BAYER DCA HB A1C WAIVED: HB A1C (BAYER DCA - WAIVED): 5.8 % (ref <7.0)

## 2020-01-23 MED ORDER — HYDROCHLOROTHIAZIDE 25 MG PO TABS: 25.0000 mg | ORAL_TABLET | Freq: Every day | ORAL | 1 refills | Status: DC

## 2020-01-23 NOTE — Assessment & Plan Note (Signed)
Rechecking levels today. Await results. Treat as needed.  

## 2020-01-23 NOTE — Assessment & Plan Note (Signed)
Will keep BP and cholesterol under good control. Continue to monitor.  

## 2020-01-23 NOTE — Assessment & Plan Note (Signed)
Rechecking labs today. Await results. Call with any concerns.  

## 2020-01-23 NOTE — Assessment & Plan Note (Signed)
Stable off medicine. Doing well. Continue to monitor. Call with any concerns.

## 2020-01-23 NOTE — Assessment & Plan Note (Signed)
Congratulated patient on her weight loss! Continue to monitor. Call with any concerns.

## 2020-01-23 NOTE — Progress Notes (Signed)
BP 130/81 (BP Location: Left Arm, Patient Position: Sitting, Cuff Size: Normal)   Pulse 75   Temp 97.8 F (36.6 C) (Oral)   Wt 191 lb (86.6 kg)   BMI 32.28 kg/m    Subjective:    Patient ID: Mary Galvan, female    DOB: November 10, 1950, 69 y.o.   MRN: [id number]  HPI: Mary Galvan is a 70 y.o. female  Chief Complaint  Patient presents with  . Hypertension  . Hyperlipidemia  . IFG  . Depression   Impaired Fasting Glucose HbA1C:  Lab Results  Component Value Date   HGBA1C 6.1 07/25/2019   Duration of elevated blood sugar: chronic Polydipsia: no Polyuria: no Weight change: no Visual disturbance: no Glucose Monitoring: no Diabetic Education: Not Completed Family history of diabetes: yes  HYPERTENSION / HYPERLIPIDEMIA Satisfied with current treatment? yes Duration of hypertension: chronic BP monitoring frequency: not checking BP medication side effects: no Past BP meds: HCTZ Duration of hyperlipidemia: chronic Cholesterol medication side effects: not on anything Cholesterol supplements: none Past cholesterol medications: none Medication compliance: excellent compliance Aspirin: no Recent stressors: no Recurrent headaches: no Visual changes: no Palpitations: no Dyspnea: no Chest pain: no Lower extremity edema: no Dizzy/lightheaded: no  ANXIETY/DEPRESSION Duration:controlled Anxious mood: no  Excessive worrying: no Irritability: no  Sweating: no Nausea: no Palpitations:no Hyperventilation: no Panic attacks: no Agoraphobia: no  Obscessions/compulsions: no Depressed mood: no Depression screen [facility] 2/9 01/23/2020 07/25/2019 05/16/2019 05/25/2018 05/12/2018  Decreased Interest 0 0 0 0 0  Down, Depressed, Hopeless 0 0 0 0 0  PHQ - 2 Score 0 0 0 0 0  Altered sleeping 0 0 - 0 -  Tired, decreased energy 0 0 - 0 -  Change in appetite 0 0 - 0 -  Feeling bad or failure about yourself  0 0 - 0 -  Trouble concentrating 0 0 - 0 -  Moving slowly or  fidgety/restless 0 0 - 0 -  Suicidal thoughts 0 0 - 0 -  PHQ-9 Score 0 0 - 0 -  Difficult doing work/chores Not difficult at all Not difficult at all - Not difficult at all -   GAD 7 : Generalized Anxiety Score 01/23/2020 07/25/2019  Nervous, Anxious, on Edge 0 0  Control/stop worrying 0 0  Worry too much - different things 0 0  Trouble relaxing 0 0  Restless 0 0  Easily annoyed or irritable 0 0  Afraid - awful might happen 0 0  Total GAD 7 Score 0 0  Anxiety Difficulty Not difficult at all Not difficult at all   Anhedonia: no Weight changes: no Insomnia: no   Hypersomnia: no Fatigue/loss of energy: no Feelings of worthlessness: no Feelings of guilt: no Impaired concentration/indecisiveness: no Suicidal ideations: no  Crying spells: no Recent Stressors/Life Changes: no   Relationship problems: no   Family stress: no     Financial stress: no    Job stress: no    Recent death/loss: no  Relevant past medical, surgical, family and social history reviewed and updated as indicated. Interim medical history since our last visit reviewed. Allergies and medications reviewed and updated.  Review of Systems  Constitutional: Negative.   Respiratory: Negative.   Cardiovascular: Negative.   Gastrointestinal: Negative.   Musculoskeletal: Negative.   Neurological: Negative.   Psychiatric/Behavioral: Negative.     Per HPI unless specifically indicated above     Objective:    BP 130/81 (BP Location: Left Arm, Patient Position: Sitting, Cuff Size: Normal)  Pulse 75   Temp 97.8 F (36.6 C) (Oral)   Wt 191 lb (86.6 kg)   BMI 32.28 kg/m   Wt Readings from Last 3 Encounters:  01/23/20 191 lb (86.6 kg)  07/25/19 199 lb (90.3 kg)  12/14/18 190 lb (86.2 kg)    Physical Exam Vitals and nursing note reviewed.  Constitutional:      General: She is not in acute distress.    Appearance: Normal appearance. She is not ill-appearing, toxic-appearing or diaphoretic.  HENT:     Head:  Normocephalic and atraumatic.     Right Ear: External ear normal.     Left Ear: External ear normal.     Nose: Nose normal.     Mouth/Throat:     Mouth: Mucous membranes are moist.     Pharynx: Oropharynx is clear.  Eyes:     General: No scleral icterus.       Right eye: No discharge.        Left eye: No discharge.     Extraocular Movements: Extraocular movements intact.     Conjunctiva/sclera: Conjunctivae normal.     Pupils: Pupils are equal, round, and reactive to light.  Cardiovascular:     Rate and Rhythm: Normal rate and regular rhythm.     Pulses: Normal pulses.     Heart sounds: Normal heart sounds. No murmur. No friction rub. No gallop.   Pulmonary:     Effort: Pulmonary effort is normal. No respiratory distress.     Breath sounds: Normal breath sounds. No stridor. No wheezing, rhonchi or rales.  Chest:     Chest wall: No tenderness.  Musculoskeletal:        General: Normal range of motion.     Cervical back: Normal range of motion and neck supple.  Skin:    General: Skin is warm and dry.     Capillary Refill: Capillary refill takes less than 2 seconds.     Coloration: Skin is not jaundiced or pale.     Findings: No bruising, erythema, lesion or rash.  Neurological:     General: No focal deficit present.     Mental Status: She is alert and oriented to person, place, and time. Mental status is at baseline.  Psychiatric:        Mood and Affect: Mood normal.        Behavior: Behavior normal.        Thought Content: Thought content normal.        Judgment: Judgment normal.     Results for orders placed or performed in visit on 07/25/19  Bayer DCA Hb A1c Waived  Result Value Ref Range   HB A1C (BAYER DCA - WAIVED) 6.1 <7.0 %  CBC with Differential/Platelet  Result Value Ref Range   WBC 7.3 3.4 - 10.8 x10E3/uL   RBC 5.07 3.77 - 5.28 x10E6/uL   Hemoglobin 15.7 11.1 - 15.9 g/dL   Hematocrit 47.4 (H) 34.0 - 46.6 %   MCV 94 79 - 97 fL   MCH 31.0 26.6 - 33.0 pg    MCHC 33.1 31.5 - 35.7 g/dL   RDW 12.8 11.7 - 15.4 %   Platelets 258 150 - 450 x10E3/uL   Neutrophils 54 Not Estab. %   Lymphs 38 Not Estab. %   Monocytes 7 Not Estab. %   Eos 1 Not Estab. %   Basos 0 Not Estab. %   Neutrophils Absolute 4.0 1.4 - 7.0 x10E3/uL   Lymphocytes Absolute 2.8  0.7 - 3.1 x10E3/uL   Monocytes Absolute 0.5 0.1 - 0.9 x10E3/uL   EOS (ABSOLUTE) 0.1 0.0 - 0.4 x10E3/uL   Basophils Absolute 0.0 0.0 - 0.2 x10E3/uL   Immature Granulocytes 0 Not Estab. %   Immature Grans (Abs) 0.0 0.0 - 0.1 x10E3/uL  Comprehensive metabolic panel  Result Value Ref Range   Glucose 100 (H) 65 - 99 mg/dL   BUN 15 8 - 27 mg/dL   Creatinine, Ser 0.57 0.57 - 1.00 mg/dL   GFR calc non Af Amer 95 >59 mL/min/1.73   GFR calc Af Amer 109 >59 mL/min/1.73   BUN/Creatinine Ratio 26 12 - 28   Sodium 139 134 - 144 mmol/L   Potassium 4.0 3.5 - 5.2 mmol/L   Chloride 98 96 - 106 mmol/L   CO2 23 20 - 29 mmol/L   Calcium 9.5 8.7 - 10.3 mg/dL   Total Protein 6.7 6.0 - 8.5 g/dL   Albumin 4.2 3.8 - 4.8 g/dL   Globulin, Total 2.5 1.5 - 4.5 g/dL   Albumin/Globulin Ratio 1.7 1.2 - 2.2   Bilirubin Total 0.3 0.0 - 1.2 mg/dL   Alkaline Phosphatase 74 39 - 117 IU/L   AST 25 0 - 40 IU/L   ALT 33 (H) 0 - 32 IU/L  Lipid Panel w/o Chol/HDL Ratio  Result Value Ref Range   Cholesterol, Total 224 (H) 100 - 199 mg/dL   Triglycerides 253 (H) 0 - 149 mg/dL   HDL 48 >39 mg/dL   VLDL Cholesterol Cal 45 (H) 5 - 40 mg/dL   LDL Chol Calc (NIH) 131 (H) 0 - 99 mg/dL  Microalbumin, Urine Waived  Result Value Ref Range   Microalb, Ur Waived 10 0 - 19 mg/L   Creatinine, Urine Waived 100 10 - 300 mg/dL   Microalb/Creat Ratio <30 <30 mg/g  TSH  Result Value Ref Range   TSH 2.350 0.450 - 4.500 uIU/mL  UA/M w/rflx Culture, Routine   Specimen: Urine   URINE  Result Value Ref Range   Specific Gravity, UA 1.025 1.005 - 1.030   pH, UA 6.5 5.0 - 7.5   Color, UA Yellow Yellow   Appearance Ur Clear Clear   Leukocytes,UA  Negative Negative   Protein,UA Negative Negative/Trace   Glucose, UA Negative Negative   Ketones, UA Negative Negative   RBC, UA Negative Negative   Bilirubin, UA Negative Negative   Urobilinogen, Ur 0.2 0.2 - 1.0 mg/dL   Nitrite, UA Negative Negative  VITAMIN D 25 Hydroxy (Vit-D Deficiency, Fractures)  Result Value Ref Range   Vit D, 25-Hydroxy 33.6 30.0 - 100.0 ng/mL      Assessment & Plan:   Problem List Items Addressed This Visit      Cardiovascular and Mediastinum   Hypertension - Primary    Under good control on current regimen. Continue current regimen. Continue to monitor. Call with any concerns. Refills given. Labs drawn today.       Relevant Medications   hydrochlorothiazide (HYDRODIURIL) 25 MG tablet   Other Relevant Orders   CBC with Differential/Platelet   Comprehensive metabolic panel   Microalbumin, Urine Waived   TSH   UA/M w/rflx Culture, Routine   VITAMIN D 25 Hydroxy (Vit-D Deficiency, Fractures)   Abdominal aortic atherosclerosis (HCC)    Will keep BP and cholesterol under good control. Continue to monitor.       Relevant Medications   hydrochlorothiazide (HYDRODIURIL) 25 MG tablet   Other Relevant Orders   CBC with Differential/Platelet  Comprehensive metabolic panel   UA/M w/rflx Culture, Routine     Digestive   Fatty liver disease, nonalcoholic    Rechecking labs today. Await results. Call with any concerns.       Relevant Orders   CBC with Differential/Platelet   Comprehensive metabolic panel   UA/M w/rflx Culture, Routine     Endocrine   IFG (impaired fasting glucose)   Relevant Orders   Bayer DCA Hb A1c Waived   CBC with Differential/Platelet   Comprehensive metabolic panel   UA/M w/rflx Culture, Routine     Other   Hyperlipidemia    Rechecking levels today. Await results. Treat as needed.       Relevant Medications   hydrochlorothiazide (HYDRODIURIL) 25 MG tablet   Other Relevant Orders   CBC with Differential/Platelet    Comprehensive metabolic panel   Lipid Panel w/o Chol/HDL Ratio   UA/M w/rflx Culture, Routine   Depression    Stable off medicine. Doing well. Continue to monitor. Call with any concerns.        Relevant Orders   CBC with Differential/Platelet   Comprehensive metabolic panel   UA/M w/rflx Culture, Routine   Anxiety    Stable off medicine. Doing well. Continue to monitor. Call with any concerns.        Relevant Orders   CBC with Differential/Platelet   Comprehensive metabolic panel   UA/M w/rflx Culture, Routine   Vitamin D deficiency    Rechecking levels today. Await results. Treat as needed.       Relevant Orders   CBC with Differential/Platelet   Comprehensive metabolic panel   UA/M w/rflx Culture, Routine   VITAMIN D 25 Hydroxy (Vit-D Deficiency, Fractures)       Follow up plan: Return in about 6 months (around 07/24/2020) for wellness/physical.

## 2020-01-23 NOTE — Assessment & Plan Note (Signed)
Under good control on current regimen. Continue current regimen. Continue to monitor. Call with any concerns. Refills given. Labs drawn today.   

## 2020-01-24 LAB — LIPID PANEL W/O CHOL/HDL RATIO
Cholesterol, Total: 222 mg/dL — ABNORMAL HIGH (ref 100–199)
HDL: 53 mg/dL (ref >39)
LDL Chol Calc (NIH): 136 mg/dL — ABNORMAL HIGH (ref 0–99)
Triglycerides: 186 mg/dL — ABNORMAL HIGH (ref 0–149)
VLDL Cholesterol Cal: 33 mg/dL (ref 5–40)

## 2020-01-24 LAB — COMPREHENSIVE METABOLIC PANEL
ALT: 39 IU/L — ABNORMAL HIGH (ref 0–32)
AST: 29 IU/L (ref 0–40)
Albumin/Globulin Ratio: 1.6 (ref 1.2–2.2)
Albumin: 4.5 g/dL (ref 3.8–4.8)
Alkaline Phosphatase: 67 IU/L (ref 39–117)
BUN/Creatinine Ratio: 23 (ref 12–28)
BUN: 14 mg/dL (ref 8–27)
Bilirubin Total: 0.2 mg/dL (ref 0.0–1.2)
CO2: 25 mmol/L (ref 20–29)
Calcium: 10.3 mg/dL (ref 8.7–10.3)
Chloride: 99 mmol/L (ref 96–106)
Creatinine, Ser: 0.6 mg/dL (ref 0.57–1.00)
GFR calc Af Amer: 107 mL/min/{1.73_m2} (ref >59)
GFR calc non Af Amer: 93 mL/min/{1.73_m2} (ref >59)
Globulin, Total: 2.8 g/dL (ref 1.5–4.5)
Glucose: 100 mg/dL — ABNORMAL HIGH (ref 65–99)
Potassium: 3.8 mmol/L (ref 3.5–5.2)
Sodium: 139 mmol/L (ref 134–144)
Total Protein: 7.3 g/dL (ref 6.0–8.5)

## 2020-01-24 LAB — CBC WITH DIFFERENTIAL/PLATELET
Basophils Absolute: 0 10*3/uL (ref 0.0–0.2)
Basos: 1 %
EOS (ABSOLUTE): 0.1 10*3/uL (ref 0.0–0.4)
Eos: 1 %
Hematocrit: 49.2 % — ABNORMAL HIGH (ref 34.0–46.6)
Hemoglobin: 16.2 g/dL — ABNORMAL HIGH (ref 11.1–15.9)
Immature Grans (Abs): 0 10*3/uL (ref 0.0–0.1)
Immature Granulocytes: 0 %
Lymphocytes Absolute: 2.8 10*3/uL (ref 0.7–3.1)
Lymphs: 46 %
MCH: 30.5 pg (ref 26.6–33.0)
MCHC: 32.9 g/dL (ref 31.5–35.7)
MCV: 93 fL (ref 79–97)
Monocytes Absolute: 0.5 10*3/uL (ref 0.1–0.9)
Monocytes: 8 %
Neutrophils Absolute: 2.6 10*3/uL (ref 1.4–7.0)
Neutrophils: 44 %
Platelets: 218 10*3/uL (ref 150–450)
RBC: 5.32 x10E6/uL — ABNORMAL HIGH (ref 3.77–5.28)
RDW: 12.8 % (ref 11.7–15.4)
WBC: 6 10*3/uL (ref 3.4–10.8)

## 2020-01-24 LAB — TSH: TSH: 1.84 u[IU]/mL (ref 0.450–4.500)

## 2020-01-24 LAB — VITAMIN D 25 HYDROXY (VIT D DEFICIENCY, FRACTURES): Vit D, 25-Hydroxy: 47.7 ng/mL (ref 30.0–100.0)

## 2020-02-02 DIAGNOSIS — H3530 Unspecified macular degeneration: Secondary | ICD-10-CM | POA: Diagnosis not present

## 2020-02-07 ENCOUNTER — Telehealth: Payer: Self-pay

## 2020-02-07 ENCOUNTER — Ambulatory Visit: Payer: Self-pay | Admitting: General Practice

## 2020-02-07 NOTE — Chronic Care Management (AMB) (Signed)
  Chronic Care Management   Outreach Note  02/07/2020 Name: Mary Galvan MRN: [id number] DOB: 1950/12/21  Referred by: Valerie Roys, DO Reason for referral : Chronic Care Management (follow up on HTN/HLD/Abdominal Aortic Atherosclerosis)   An unsuccessful telephone outreach was attempted today. The patient was referred to the case management team for assistance with care management and care coordination.   Follow Up Plan: A HIPPA compliant phone message was left for the patient providing contact information and requesting a return call.   Noreene Larsson RN, MSN, [facility] Mobile: [phone]

## 2020-03-06 ENCOUNTER — Ambulatory Visit: Payer: Self-pay | Admitting: General Practice

## 2020-03-06 ENCOUNTER — Telehealth: Payer: Self-pay

## 2020-03-06 NOTE — Chronic Care Management (AMB) (Signed)
  Chronic Care Management   Outreach Note  03/06/2020 Name: Mary Galvan MRN: [id number] DOB: Mar 14, 1951  Referred by: Valerie Roys, DO Reason for referral : Chronic Care Management (Follow up: 2nd attempt for HTN/HLD and other needs)   A second unsuccessful telephone outreach was attempted today. The patient was referred to the case management team for assistance with care management and care coordination.   Follow Up Plan: A HIPPA compliant phone message was left for the patient providing contact information and requesting a return call.   Noreene Larsson RN, MSN, [facility] Mobile: [phone]

## 2020-04-02 ENCOUNTER — Ambulatory Visit: Payer: Medicare Other | Admitting: Dermatology

## 2020-04-02 ENCOUNTER — Encounter: Payer: Self-pay | Admitting: Dermatology

## 2020-04-02 ENCOUNTER — Other Ambulatory Visit: Payer: Self-pay

## 2020-04-02 DIAGNOSIS — S30861A Insect bite (nonvenomous) of abdominal wall, initial encounter: Secondary | ICD-10-CM

## 2020-04-02 DIAGNOSIS — D229 Melanocytic nevi, unspecified: Secondary | ICD-10-CM

## 2020-04-02 DIAGNOSIS — Z1283 Encounter for screening for malignant neoplasm of skin: Secondary | ICD-10-CM | POA: Diagnosis not present

## 2020-04-02 DIAGNOSIS — L814 Other melanin hyperpigmentation: Secondary | ICD-10-CM

## 2020-04-02 DIAGNOSIS — L905 Scar conditions and fibrosis of skin: Secondary | ICD-10-CM

## 2020-04-02 DIAGNOSIS — Z86018 Personal history of other benign neoplasm: Secondary | ICD-10-CM | POA: Diagnosis not present

## 2020-04-02 DIAGNOSIS — L720 Epidermal cyst: Secondary | ICD-10-CM

## 2020-04-02 DIAGNOSIS — L719 Rosacea, unspecified: Secondary | ICD-10-CM

## 2020-04-02 DIAGNOSIS — L578 Other skin changes due to chronic exposure to nonionizing radiation: Secondary | ICD-10-CM

## 2020-04-02 DIAGNOSIS — L570 Actinic keratosis: Secondary | ICD-10-CM | POA: Diagnosis not present

## 2020-04-02 DIAGNOSIS — L820 Inflamed seborrheic keratosis: Secondary | ICD-10-CM

## 2020-04-02 DIAGNOSIS — D1800 Hemangioma unspecified site: Secondary | ICD-10-CM

## 2020-04-02 DIAGNOSIS — L821 Other seborrheic keratosis: Secondary | ICD-10-CM

## 2020-04-02 DIAGNOSIS — W57XXXA Bitten or stung by nonvenomous insect and other nonvenomous arthropods, initial encounter: Secondary | ICD-10-CM

## 2020-04-02 MED ORDER — METRONIDAZOLE 0.75 % EX GEL: 1.0000 "application " | Freq: Two times a day (BID) | CUTANEOUS | 6 refills | Status: DC

## 2020-04-02 NOTE — Progress Notes (Signed)
Follow-Up Visit   Subjective  Mary Galvan is a 69 y.o. female who presents for the following: Annual Exam (Hx BCC, AK's, and dysplastic nevus - patient has noticed no new or changing moles, lesions, or spots). The patient presents for Total-Body Skin Exam (TBSE) for skin cancer screening and mole check.  The following portions of the chart were reviewed this encounter and updated as appropriate:  Tobacco  Allergies  Meds  Problems  Med Hx  Surg Hx  Fam Hx     Review of Systems:  No other skin or systemic complaints except as noted in HPI or Assessment and Plan.  Objective  Well appearing patient in no apparent distress; mood and affect are within normal limits.  A full examination was performed including scalp, head, eyes, ears, nose, lips, neck, chest, axillae, abdomen, back, buttocks, bilateral upper extremities, bilateral lower extremities, hands, feet, fingers, toes, fingernails, and toenails. All findings within normal limits unless otherwise noted below.  Objective  right distal dorsum foot: Scar with no evidence of recurrence.   Objective  Face (10): Erythematous thin papules/macules with gritty scale.   Objective  Face: Telangiectasias on the cheeks  Objective  R forearm x 1, L infra mammary x 1, L popliteal x 1, R thigh x 1 (4), Right Upper Eyelid: Erythematous keratotic or waxy stuck-on papule or plaque.   Objective  Right Upper Back: Crusted papule   Objective  L arm: Dyspigmented smooth macule or patch.   Objective  L mandible: Subcutaneous nodule.    Assessment & Plan    History of dysplastic nevus right distal dorsum foot  Clear. Observe for recurrence. Call clinic for new or changing lesions.  Recommend regular skin exams, daily broad-spectrum spf 30+ sunscreen use, and photoprotection.     AK (actinic keratosis) (10) Face  Destruction of lesion - Face Complexity: simple   Destruction method: cryotherapy   Informed consent:  discussed and consent obtained   Timeout:  patient name, date of birth, surgical site, and procedure verified Lesion destroyed using liquid nitrogen: Yes   Region frozen until ice ball extended beyond lesion: Yes   Outcome: patient tolerated procedure well with no complications   Post-procedure details: wound care instructions given    Rosacea Face  Start Metronidazole 0.75% cream BID   metroNIDAZOLE (METROGEL) 0.75 % gel - Face  Inflamed seborrheic keratosis (5) R forearm x 1, L infra mammary x 1, L popliteal x 1, R thigh x 1 (4); Right Upper Eyelid  Destruction of lesion - R forearm x 1, L infra mammary x 1, L popliteal x 1, R thigh x 1 Complexity: simple   Destruction method: cryotherapy   Informed consent: discussed and consent obtained   Timeout:  patient name, date of birth, surgical site, and procedure verified Lesion destroyed using liquid nitrogen: Yes   Region frozen until ice ball extended beyond lesion: Yes   Outcome: patient tolerated procedure well with no complications   Post-procedure details: wound care instructions given    Insect bite of abdomen with local reaction, initial encounter Right Upper Back  Benign, observe.    Scar L arm  Clear. Observe for recurrence. Call clinic for new or changing lesions.  Recommend regular skin exams, daily broad-spectrum spf 30+ sunscreen use, and photoprotection. No LAD    Epidermal inclusion cyst L mandible  Benign, observe.    Skin cancer screening   Lentigines - Scattered tan macules - Discussed due to sun exposure - Benign, observe -  Call for any changes  Seborrheic Keratoses - Stuck-on, waxy, tan-brown papules and plaques  - Discussed benign etiology and prognosis. - Observe - Call for any changes  Melanocytic Nevi - Tan-brown and/or pink-flesh-colored symmetric macules and papules - Benign appearing on exam today - Observation - Call clinic for new or changing moles - Recommend daily use of  broad spectrum spf 30+ sunscreen to sun-exposed areas.   Hemangiomas - Red papules - Discussed benign nature - Observe - Call for any changes  Actinic Damage - diffuse scaly erythematous macules with underlying dyspigmentation - Recommend daily broad spectrum sunscreen SPF 30+ to sun-exposed areas, reapply every 2 hours as needed.  - Call for new or changing lesions.  Skin cancer screening performed today.   Return in about 1 year (around 04/02/2021) for TBSE.  Luther Redo, CMA, am acting as scribe for Sarina Ser, MD .  Documentation: I have reviewed the above documentation for accuracy and completeness, and I agree with the above.  Sarina Ser, MD

## 2020-04-03 ENCOUNTER — Encounter: Payer: Self-pay | Admitting: Dermatology

## 2020-04-18 ENCOUNTER — Telehealth: Payer: Self-pay

## 2020-04-18 ENCOUNTER — Ambulatory Visit: Payer: Self-pay | Admitting: General Practice

## 2020-04-18 NOTE — Chronic Care Management (AMB) (Signed)
  Chronic Care Management   Outreach Note  04/18/2020 Name: Mary Galvan MRN: [id number] DOB: 09/14/1951  Referred by: Valerie Roys, DO Reason for referral : Chronic Care Management (Follow up-3rd attempt: RNCM Chronic Disease Management and Care Coordination Needs )   Third unsuccessful telephone outreach was attempted today. The patient was referred to the case management team for assistance with care management and care coordination. The patient's primary care provider has been notified of our unsuccessful attempts to make or maintain contact with the patient. The care management team is pleased to engage with this patient at any time in the future should he/she be interested in assistance from the care management team.   Follow Up Plan: A HIPPA compliant phone message was left for the patient providing contact information and requesting a return call.   Noreene Larsson RN, MSN, [facility] Mobile: [phone]

## 2020-07-15 DIAGNOSIS — Z20822 Contact with and (suspected) exposure to covid-19: Secondary | ICD-10-CM | POA: Diagnosis not present

## 2020-08-02 DIAGNOSIS — H2513 Age-related nuclear cataract, bilateral: Secondary | ICD-10-CM | POA: Diagnosis not present

## 2020-08-02 DIAGNOSIS — H353132 Nonexudative age-related macular degeneration, bilateral, intermediate dry stage: Secondary | ICD-10-CM | POA: Diagnosis not present

## 2020-08-13 ENCOUNTER — Encounter: Payer: Self-pay | Admitting: Family Medicine

## 2020-08-13 ENCOUNTER — Ambulatory Visit (INDEPENDENT_AMBULATORY_CARE_PROVIDER_SITE_OTHER): Payer: Medicare Other | Admitting: Family Medicine

## 2020-08-13 ENCOUNTER — Ambulatory Visit (INDEPENDENT_AMBULATORY_CARE_PROVIDER_SITE_OTHER): Payer: Medicare Other

## 2020-08-13 ENCOUNTER — Other Ambulatory Visit: Payer: Self-pay

## 2020-08-13 VITALS — Ht 65.0 in | Wt 169.0 lb

## 2020-08-13 VITALS — BP 134/78 | HR 66 | Temp 97.8°F | Ht 64.0 in | Wt 170.2 lb

## 2020-08-13 DIAGNOSIS — E559 Vitamin D deficiency, unspecified: Secondary | ICD-10-CM | POA: Diagnosis not present

## 2020-08-13 DIAGNOSIS — Z23 Encounter for immunization: Secondary | ICD-10-CM | POA: Diagnosis not present

## 2020-08-13 DIAGNOSIS — I10 Essential (primary) hypertension: Secondary | ICD-10-CM

## 2020-08-13 DIAGNOSIS — R7301 Impaired fasting glucose: Secondary | ICD-10-CM | POA: Diagnosis not present

## 2020-08-13 DIAGNOSIS — F3342 Major depressive disorder, recurrent, in full remission: Secondary | ICD-10-CM

## 2020-08-13 DIAGNOSIS — Z0000 Encounter for general adult medical examination without abnormal findings: Secondary | ICD-10-CM

## 2020-08-13 DIAGNOSIS — E782 Mixed hyperlipidemia: Secondary | ICD-10-CM | POA: Diagnosis not present

## 2020-08-13 DIAGNOSIS — K760 Fatty (change of) liver, not elsewhere classified: Secondary | ICD-10-CM

## 2020-08-13 LAB — URINALYSIS, ROUTINE W REFLEX MICROSCOPIC
Bilirubin, UA: NEGATIVE
Glucose, UA: NEGATIVE
Ketones, UA: NEGATIVE
Leukocytes,UA: NEGATIVE
Nitrite, UA: NEGATIVE
Protein,UA: NEGATIVE
RBC, UA: NEGATIVE
Specific Gravity, UA: 1.01 (ref 1.005–1.030)
Urobilinogen, Ur: 0.2 mg/dL (ref 0.2–1.0)
pH, UA: 5 (ref 5.0–7.5)

## 2020-08-13 LAB — MICROALBUMIN, URINE WAIVED
Creatinine, Urine Waived: 50 mg/dL (ref 10–300)
Microalb, Ur Waived: 10 mg/L (ref 0–19)
Microalb/Creat Ratio: <30 mg/g (ref <30)

## 2020-08-13 LAB — BAYER DCA HB A1C WAIVED: HB A1C (BAYER DCA - WAIVED): 5.6 % (ref <7.0)

## 2020-08-13 MED ORDER — HYDROCHLOROTHIAZIDE 25 MG PO TABS: 25.0000 mg | ORAL_TABLET | Freq: Every day | ORAL | 1 refills | Status: DC

## 2020-08-13 NOTE — Patient Instructions (Signed)
Mary Galvan , Thank you for taking time to come for your Medicare Wellness Visit. I appreciate your ongoing commitment to your health goals. Please review the following plan we discussed and let me know if I can assist you in the future.   Screening recommendations/referrals: Colonoscopy: completed 05/10/2015, due now Mammogram: completed 07/22/2019, due 07/21/2021 Bone Density: completed 04/14/2016 Recommended yearly ophthalmology/optometry visit for glaucoma screening and checkup Recommended yearly dental visit for hygiene and checkup  Vaccinations: Influenza vaccine: today Pneumococcal vaccine: completed 04/21/2016 Tdap vaccine: completed 05/16/2013, due 05/17/2023 Shingles vaccine: completed   Covid-19: 11/22/2019, 12/13/2019, 08/02/2020  Advanced directives: Advance directive discussed with you today.   Conditions/risks identified: none  Next appointment: Follow up in one year for your annual wellness visit    Preventive Care 65 Years and Older, Female Preventive care refers to lifestyle choices and visits with your health care provider that can promote health and wellness. What does preventive care include?  A yearly physical exam. This is also called an annual well check.  Dental exams once or twice a year.  Routine eye exams. Ask your health care provider how often you should have your eyes checked.  Personal lifestyle choices, including:  Daily care of your teeth and gums.  Regular physical activity.  Eating a healthy diet.  Avoiding tobacco and drug use.  Limiting alcohol use.  Practicing safe sex.  Taking low-dose aspirin every day.  Taking vitamin and mineral supplements as recommended by your health care provider. What happens during an annual well check? The services and screenings done by your health care provider during your annual well check will depend on your age, overall health, lifestyle risk factors, and family history of disease. Counseling  Your  health care provider may ask you questions about your:  Alcohol use.  Tobacco use.  Drug use.  Emotional well-being.  Home and relationship well-being.  Sexual activity.  Eating habits.  History of falls.  Memory and ability to understand (cognition).  Work and work Statistician.  Reproductive health. Screening  You may have the following tests or measurements:  Height, weight, and BMI.  Blood pressure.  Lipid and cholesterol levels. These may be checked every 5 years, or more frequently if you are over 7 years old.  Skin check.  Lung cancer screening. You may have this screening every year starting at age 8 if you have a 30-pack-year history of smoking and currently smoke or have quit within the past 15 years.  Fecal occult blood test (FOBT) of the stool. You may have this test every year starting at age 29.  Flexible sigmoidoscopy or colonoscopy. You may have a sigmoidoscopy every 5 years or a colonoscopy every 10 years starting at age 26.  Hepatitis C blood test.  Hepatitis B blood test.  Sexually transmitted disease (STD) testing.  Diabetes screening. This is done by checking your blood sugar (glucose) after you have not eaten for a while (fasting). You may have this done every 1-3 years.  Bone density scan. This is done to screen for osteoporosis. You may have this done starting at age 22.  Mammogram. This may be done every 1-2 years. Talk to your health care provider about how often you should have regular mammograms. Talk with your health care provider about your test results, treatment options, and if necessary, the need for more tests. Vaccines  Your health care provider may recommend certain vaccines, such as:  Influenza vaccine. This is recommended every year.  Tetanus, diphtheria, and  acellular pertussis (Tdap, Td) vaccine. You may need a Td booster every 10 years.  Zoster vaccine. You may need this after age 73.  Pneumococcal 13-valent  conjugate (PCV13) vaccine. One dose is recommended after age 101.  Pneumococcal polysaccharide (PPSV23) vaccine. One dose is recommended after age 72. Talk to your health care provider about which screenings and vaccines you need and how often you need them. This information is not intended to replace advice given to you by your health care provider. Make sure you discuss any questions you have with your health care provider. Document Released: 10/26/2015 Document Revised: 06/18/2016 Document Reviewed: 07/31/2015 Elsevier Interactive Patient Education  2017 [place] Prevention in the Home Falls can cause injuries. They can happen to people of all ages. There are many things you can do to make your home safe and to help prevent falls. What can I do on the outside of my home?  Regularly fix the edges of walkways and driveways and fix any cracks.  Remove anything that might make you trip as you walk through a door, such as a raised step or threshold.  Trim any bushes or trees on the path to your home.  Use bright outdoor lighting.  Clear any walking paths of anything that might make someone trip, such as rocks or tools.  Regularly check to see if handrails are loose or broken. Make sure that both sides of any steps have handrails.  Any raised decks and porches should have guardrails on the edges.  Have any leaves, snow, or ice cleared regularly.  Use sand or salt on walking paths during winter.  Clean up any spills in your garage right away. This includes oil or grease spills. What can I do in the bathroom?  Use night lights.  Install grab bars by the toilet and in the tub and shower. Do not use towel bars as grab bars.  Use non-skid mats or decals in the tub or shower.  If you need to sit down in the shower, use a plastic, non-slip stool.  Keep the floor dry. Clean up any water that spills on the floor as soon as it happens.  Remove soap buildup in the tub or  shower regularly.  Attach bath mats securely with double-sided non-slip rug tape.  Do not have throw rugs and other things on the floor that can make you trip. What can I do in the bedroom?  Use night lights.  Make sure that you have a light by your bed that is easy to reach.  Do not use any sheets or blankets that are too big for your bed. They should not hang down onto the floor.  Have a firm chair that has side arms. You can use this for support while you get dressed.  Do not have throw rugs and other things on the floor that can make you trip. What can I do in the kitchen?  Clean up any spills right away.  Avoid walking on wet floors.  Keep items that you use a lot in easy-to-reach places.  If you need to reach something above you, use a strong step stool that has a grab bar.  Keep electrical cords out of the way.  Do not use floor polish or wax that makes floors slippery. If you must use wax, use non-skid floor wax.  Do not have throw rugs and other things on the floor that can make you trip. What can I do with my stairs?  Do not leave any items on the stairs.  Make sure that there are handrails on both sides of the stairs and use them. Fix handrails that are broken or loose. Make sure that handrails are as long as the stairways.  Check any carpeting to make sure that it is firmly attached to the stairs. Fix any carpet that is loose or worn.  Avoid having throw rugs at the top or bottom of the stairs. If you do have throw rugs, attach them to the floor with carpet tape.  Make sure that you have a light switch at the top of the stairs and the bottom of the stairs. If you do not have them, ask someone to add them for you. What else can I do to help prevent falls?  Wear shoes that:  Do not have high heels.  Have rubber bottoms.  Are comfortable and fit you well.  Are closed at the toe. Do not wear sandals.  If you use a stepladder:  Make sure that it is fully  opened. Do not climb a closed stepladder.  Make sure that both sides of the stepladder are locked into place.  Ask someone to hold it for you, if possible.  Clearly mark and make sure that you can see:  Any grab bars or handrails.  First and last steps.  Where the edge of each step is.  Use tools that help you move around (mobility aids) if they are needed. These include:  Canes.  Walkers.  Scooters.  Crutches.  Turn on the lights when you go into a dark area. Replace any light bulbs as soon as they burn out.  Set up your furniture so you have a clear path. Avoid moving your furniture around.  If any of your floors are uneven, fix them.  If there are any pets around you, be aware of where they are.  Review your medicines with your doctor. Some medicines can make you feel dizzy. This can increase your chance of falling. Ask your doctor what other things that you can do to help prevent falls. This information is not intended to replace advice given to you by your health care provider. Make sure you discuss any questions you have with your health care provider. Document Released: 07/26/2009 Document Revised: 03/06/2016 Document Reviewed: 11/03/2014 Elsevier Interactive Patient Education  2017 [employer].

## 2020-08-13 NOTE — Patient Instructions (Addendum)
Influenza (Flu) Vaccine (Inactivated or Recombinant): What You Need to Know 1. Why get vaccinated? Influenza vaccine can prevent influenza (flu). Flu is a contagious disease that spreads around the [place] every year, usually between October and May. Anyone can get the flu, but it is more dangerous for some people. Infants and young children, people 70 years of age and older, pregnant women, and people with certain health conditions or a weakened immune system are at greatest risk of flu complications. Pneumonia, bronchitis, sinus infections and ear infections are examples of flu-related complications. If you have a medical condition, such as heart disease, cancer or diabetes, flu can make it worse. Flu can cause fever and chills, sore throat, muscle aches, fatigue, cough, headache, and runny or stuffy nose. Some people may have vomiting and diarrhea, though this is more common in children than adults. Each year thousands of people in the [place] die from flu, and many more are hospitalized. Flu vaccine prevents millions of illnesses and flu-related visits to the doctor each year. 2. Influenza vaccine CDC recommends everyone 57 months of age and older get vaccinated every flu season. Children 6 months through 2 years of age may need 2 doses during a single flu season. Everyone else needs only 1 dose each flu season. It takes about 2 weeks for protection to develop after vaccination. There are many flu viruses, and they are always changing. Each year a new flu vaccine is made to protect against three or four viruses that are likely to cause disease in the upcoming flu season. Even when the vaccine doesn't exactly match these viruses, it may still provide some protection. Influenza vaccine does not cause flu. Influenza vaccine may be given at the same time as other vaccines. 3. Talk with your health care provider Tell your vaccine provider if the person getting the vaccine:  Has had an  allergic reaction after a previous dose of influenza vaccine, or has any severe, life-threatening allergies.  Has ever had Guillain-Barr Syndrome (also called GBS). In some cases, your health care provider may decide to postpone influenza vaccination to a future visit. People with minor illnesses, such as a cold, may be vaccinated. People who are moderately or severely ill should usually wait until they recover before getting influenza vaccine. Your health care provider can give you more information. 4. Risks of a vaccine reaction  Soreness, redness, and swelling where shot is given, fever, muscle aches, and headache can happen after influenza vaccine.  There may be a very small increased risk of Guillain-Barr Syndrome (GBS) after inactivated influenza vaccine (the flu shot). Young children who get the flu shot along with pneumococcal vaccine (PCV13), and/or DTaP vaccine at the same time might be slightly more likely to have a seizure caused by fever. Tell your health care provider if a child who is getting flu vaccine has ever had a seizure. People sometimes faint after medical procedures, including vaccination. Tell your provider if you feel dizzy or have vision changes or ringing in the ears. As with any medicine, there is a very remote chance of a vaccine causing a severe allergic reaction, other serious injury, or death. 5. What if there is a serious problem? An allergic reaction could occur after the vaccinated person leaves the clinic. If you see signs of a severe allergic reaction (hives, swelling of the face and throat, difficulty breathing, a fast heartbeat, dizziness, or weakness), call 9-1-1 and get the person to the nearest hospital. For other signs that  concern you, call your health care provider. Adverse reactions should be reported to the Vaccine Adverse Event Reporting System (VAERS). Your health care provider will usually file this report, or you can do it yourself. Visit the  VAERS website at www.vaers.SamedayNews.es or call (737) 630-1181.VAERS is only for reporting reactions, and VAERS staff do not give medical advice. 6. The National Vaccine Injury Compensation Program The [employer] Vaccine Injury Compensation Program (VICP) is a federal program that was created to compensate people who may have been injured by certain vaccines. Visit the VICP website at GoldCloset.com.ee or call (506)502-7489 to learn about the program and about filing a claim. There is a time limit to file a claim for compensation. 7. How can I learn more?  Ask your healthcare provider.  Call your local or state health department.  Contact the Centers for Disease Control and Prevention (CDC): ? Call (985) 475-2487 (1-800-CDC-INFO) or ? Visit CDC's https://gibson.com/ Vaccine Information Statement (Interim) Inactivated Influenza Vaccine (05/27/2018) This information is not intended to replace advice given to you by your health care provider. Make sure you discuss any questions you have with your health care provider. Document Revised: 01/18/2019 Document Reviewed: 05/31/2018 Elsevier Patient Education  [place] Maintenance After Age 70 After age 74, you are at a higher risk for certain long-term diseases and infections as well as injuries from falls. Falls are a major cause of broken bones and head injuries in people who are older than age 64. Getting regular preventive care can help to keep you healthy and well. Preventive care includes getting regular testing and making lifestyle changes as recommended by your health care provider. Talk with your health care provider about:  Which screenings and tests you should have. A screening is a test that checks for a disease when you have no symptoms.  A diet and exercise plan that is right for you. What should I know about screenings and tests to prevent falls? Screening and testing are the best ways to find a health problem  early. Early diagnosis and treatment give you the best chance of managing medical conditions that are common after age 38. Certain conditions and lifestyle choices may make you more likely to have a fall. Your health care provider may recommend:  Regular vision checks. Poor vision and conditions such as cataracts can make you more likely to have a fall. If you wear glasses, make sure to get your prescription updated if your vision changes.  Medicine review. Work with your health care provider to regularly review all of the medicines you are taking, including over-the-counter medicines. Ask your health care provider about any side effects that may make you more likely to have a fall. Tell your health care provider if any medicines that you take make you feel dizzy or sleepy.  Osteoporosis screening. Osteoporosis is a condition that causes the bones to get weaker. This can make the bones weak and cause them to break more easily.  Blood pressure screening. Blood pressure changes and medicines to control blood pressure can make you feel dizzy.  Strength and balance checks. Your health care provider may recommend certain tests to check your strength and balance while standing, walking, or changing positions.  Foot health exam. Foot pain and numbness, as well as not wearing proper footwear, can make you more likely to have a fall.  Depression screening. You may be more likely to have a fall if you have a fear of falling, feel emotionally low, or feel unable  to do activities that you used to do.  Alcohol use screening. Using too much alcohol can affect your balance and may make you more likely to have a fall. What actions can I take to lower my risk of falls? General instructions  Talk with your health care provider about your risks for falling. Tell your health care provider if: ? You fall. Be sure to tell your health care provider about all falls, even ones that seem minor. ? You feel dizzy, sleepy,  or off-balance.  Take over-the-counter and prescription medicines only as told by your health care provider. These include any supplements.  Eat a healthy diet and maintain a healthy weight. A healthy diet includes low-fat dairy products, low-fat (lean) meats, and fiber from whole grains, beans, and lots of fruits and vegetables. Home safety  Remove any tripping hazards, such as rugs, cords, and clutter.  Install safety equipment such as grab bars in bathrooms and safety rails on stairs.  Keep rooms and walkways well-lit. Activity   Follow a regular exercise program to stay fit. This will help you maintain your balance. Ask your health care provider what types of exercise are appropriate for you.  If you need a cane or walker, use it as recommended by your health care provider.  Wear supportive shoes that have nonskid soles. Lifestyle  Do not drink alcohol if your health care provider tells you not to drink.  If you drink alcohol, limit how much you have: ? 0-1 drink a day for women. ? 0-2 drinks a day for men.  Be aware of how much alcohol is in your drink. In the [place] one drink equals one typical bottle of beer (12 oz), one-half glass of wine (5 oz), or one shot of hard liquor (1 oz).  Do not use any products that contain nicotine or tobacco, such as cigarettes and e-cigarettes. If you need help quitting, ask your health care provider. Summary  Having a healthy lifestyle and getting preventive care can help to protect your health and wellness after age 29.  Screening and testing are the best way to find a health problem early and help you avoid having a fall. Early diagnosis and treatment give you the best chance for managing medical conditions that are more common for people who are older than age 1.  Falls are a major cause of broken bones and head injuries in people who are older than age 63. Take precautions to prevent a fall at home.  Work with your health care  provider to learn what changes you can make to improve your health and wellness and to prevent falls. This information is not intended to replace advice given to you by your health care provider. Make sure you discuss any questions you have with your health care provider. Document Revised: 01/20/2019 Document Reviewed: 08/12/2017 Elsevier Patient Education  2020 [employer].  Recurrent Migraine Headache  Migraines are a type of headache, and they are usually stronger and more sudden than normal headaches (tension headaches). Migraines are characterized by an intense pulsing, throbbing pain that is usually only present on one side of the head. Sometimes, migraine headaches can cause nausea, vomiting, sensitivity to light and sound, and vision changes. Recurrent migraines keep coming back (recurring). A migraine can last from 4 hours up to 3 days. What are the causes? The exact cause of this condition is not known. However, a migraine may be caused when nerves in the brain become irritated and release chemicals that  cause inflammation of blood vessels. This inflammation causes pain. Certain things may also trigger migraines, such as:  A disruption in your regular eating and sleeping schedule.  Smoking.  Stress.  Menstruation.  Certain foods and drinks, such as: ? Aged cheese. ? Chocolate. ? Alcohol. ? Caffeine. ? Foods or drinks that contain nitrates, glutamate, aspartame, MSG, or tyramine.  Lack of sleep.  Hunger.  Physical exertion.  Fatigue.  High altitude.  Weather changes.  Medicines, such as: ? Nitroglycerin, which is used to treat chest pain. ? Birth control pills. ? Estrogen. ? Some blood pressure medicines. What are the signs or symptoms? Symptoms of this condition vary for each person and may include:  Pain that is usually only present on one side of the head. In some cases, the pain may be on both sides of the head or around the head or neck.  Pulsating or  throbbing pain.  Severe pain that prevents daily activities.  Pain that is aggravated by any physical activity.  Nausea, vomiting, or both.  Dizziness.  Pain with exposure to bright lights, loud noises, or activity.  General sensitivity to bright lights, loud noises, or smells. Before you get a migraine, you may get warning signs that a migraine is coming (aura). An aura may include:  Seeing flashing lights.  Seeing bright spots, halos, or zigzag lines.  Having tunnel vision or blurred vision.  Having numbness or a tingling feeling.  Having trouble talking.  Having muscle weakness.  Smelling a certain odor. How is this diagnosed? This condition is often diagnosed based on:  Your symptoms and medical history.  A physical exam. You may also have tests, including:  A CT scan or MRI of your brain. These imaging tests cannot diagnose migraines, but they can help to rule out other causes of headaches.  Blood tests. How is this treated? This condition is treated with:  Medicines. These are used for: ? Lessening pain and nausea. ? Preventing recurrent migraines.  Lifestyle changes, such as changes to your diet or sleeping patterns.  Behavior therapy, such as relaxation training or biofeedback. Biofeedback is a treatment that involves teaching you to relax and use your brain to lower your heart rate and control your breathing. Follow these instructions at home: Medicines  Take over-the-counter and prescription medicines only as told by your health care provider.  Do not drive or use heavy machinery while taking prescription pain medicine. Lifestyle  Do not use any products that contain nicotine or tobacco, such as cigarettes and e-cigarettes. If you need help quitting, ask your health care provider.  Limit alcohol intake to no more than 1 drink a day for nonpregnant women and 2 drinks a day for men. One drink equals 12 oz of beer, 5 oz of wine, or 1 oz of hard  liquor.  Get 7-9 hours of sleep each night, or the amount of sleep recommended by your health care provider.  Limit your stress. Talk with your health care provider if you need help with stress management.  Maintain a healthy weight. If you need help losing weight, ask your health care provider.  Exercise regularly. Aim for 150 minutes of moderate-intensity exercise (walking, biking, yoga) or 75 minutes of vigorous exercise (running, circuit training, swimming) each week. General instructions   Keep a journal to find out what triggers your migraine headaches so you can avoid these triggers. For example, write down: ? What you eat and drink. ? How much sleep you get. ? Any  change to your diet or medicines.  Lie down in a dark, quiet room when you have a migraine.  Try placing a cool towel over your head when you have a migraine.  Keep lights dim, if bright lights bother you and make your migraines worse.  Keep all follow-up visits as told by your health care provider. This is important. Contact a health care provider if:  Your pain does not improve, even with medicine.  Your migraines continue to return, even with medicine.  You have a fever.  You have weight loss. Get help right away if:  Your migraine becomes severe and medicine does not help.  You have a stiff neck.  You have a loss of vision.  You have muscle weakness or loss of muscle control.  You start losing your balance or have trouble walking.  You feel faint or you pass out.  You develop new, severe symptoms.  You start having abrupt severe headaches that last for a second or less, like a thunderclap. Summary  Migraine headaches are usually stronger and more sudden than normal headaches (tension headaches). Migraines are characterized by an intense pulsing, throbbing pain that is usually only present on one side of the head.  The exact cause of this condition is not known. However, a migraine may be  caused when nerves in the brain become irritated and release chemicals that cause inflammation of blood vessels.  Certain things may trigger migraines, such as changes to diet or sleeping patterns, smoking, certain foods, alcohol, stress, and certain medicines.  Sometimes, migraine headaches can cause nausea, vomiting, sensitivity to light and sound, and vision changes.  Migraines are often diagnosed based on your symptoms, medical history, and a physical exam. This information is not intended to replace advice given to you by your health care provider. Make sure you discuss any questions you have with your health care provider. Document Revised: 10/02/2017 Document Reviewed: 07/11/2016 Elsevier Patient Education  2020 [employer].

## 2020-08-13 NOTE — Progress Notes (Signed)
BP 134/78 (BP Location: Left Arm, Cuff Size: Normal)   Pulse 66   Temp 97.8 F (36.6 C) (Oral)   Ht 5\' 4"  (1.626 m)   Wt 170 lb 3.2 oz (77.2 kg)   SpO2 99%   BMI 29.21 kg/m    Subjective:    Patient ID: Mary Galvan, female    DOB: 09/12/1951, 69 y.o.   MRN: [id number]  HPI: Mary Galvan is a 69 y.o. female presenting on 08/13/2020 for comprehensive medical examination. Current medical complaints include:  Impaired Fasting Glucose HbA1C:  Lab Results  Component Value Date   HGBA1C 5.6 08/13/2020   Duration of elevated blood sugar: chronic  Polydipsia: no Polyuria: no Weight change: yes- with effort Visual disturbance: no Glucose Monitoring: no Diabetic Education: Completed Family history of diabetes: yes  HYPERTENSION / HYPERLIPIDEMIA Satisfied with current treatment? yes Duration of hypertension: chronic BP monitoring frequency: not checking BP medication side effects: no Past BP meds: HCTZ Duration of hyperlipidemia: chronic Cholesterol medication side effects: not on anything Cholesterol supplements: none Past cholesterol medications: none Medication compliance: excellent compliance Aspirin: no Recent stressors: yes Recurrent headaches: no Visual changes: no Palpitations: no Dyspnea: no Chest pain: no Lower extremity edema: no Dizzy/lightheaded: no  DEPRESSION Mood status: controlled Satisfied with current treatment?: yes Symptom severity:none   Duration of current treatment : not on anything Psychotherapy/counseling: no  Depressed mood: no Anxious mood: no Anhedonia: no Significant weight loss or gain: no Insomnia: no  Fatigue: no Feelings of worthlessness or guilt: no Impaired concentration/indecisiveness: no Suicidal ideations: no Hopelessness: no Crying spells: no Depression screen [facility] 2/9 08/13/2020 01/23/2020 07/25/2019 05/16/2019 05/25/2018  Decreased Interest 0 0 0 0 0  Down, Depressed, Hopeless 0 0 0 0 0  PHQ - 2 Score 0 0 0 0  0  Altered sleeping 0 0 0 - 0  Tired, decreased energy 0 0 0 - 0  Change in appetite 0 0 0 - 0  Feeling bad or failure about yourself  0 0 0 - 0  Trouble concentrating 0 0 0 - 0  Moving slowly or fidgety/restless 0 0 0 - 0  Suicidal thoughts 0 0 0 - 0  PHQ-9 Score 0 0 0 - 0  Difficult doing work/chores Not difficult at all Not difficult at all Not difficult at all - Not difficult at all   Menopausal Symptoms: no  Depression Screen done today and results listed below:  Depression screen [facility] 2/9 08/13/2020 01/23/2020 07/25/2019 05/16/2019 05/25/2018  Decreased Interest 0 0 0 0 0  Down, Depressed, Hopeless 0 0 0 0 0  PHQ - 2 Score 0 0 0 0 0  Altered sleeping 0 0 0 - 0  Tired, decreased energy 0 0 0 - 0  Change in appetite 0 0 0 - 0  Feeling bad or failure about yourself  0 0 0 - 0  Trouble concentrating 0 0 0 - 0  Moving slowly or fidgety/restless 0 0 0 - 0  Suicidal thoughts 0 0 0 - 0  PHQ-9 Score 0 0 0 - 0  Difficult doing work/chores Not difficult at all Not difficult at all Not difficult at all - Not difficult at all    Past Medical History:  Past Medical History:  Diagnosis Date  . Actinic keratosis   . Anxiety   . Arthritis    thumbs  . Basal cell carcinoma 10/20/2017   just under left brow  . Depression   . Dysplastic nevus  01/19/2018   right distal dorsum foot  . Fatty liver disease, nonalcoholic   . GERD (gastroesophageal reflux disease)   . Heart murmur   . Hyperlipidemia   . Hypertension   . Kidney cysts   . Motion sickness    reading in cars  . Ocular migraine   . PONV (postoperative nausea and vomiting)    slow to wake  . Rosacea   . Seasonal affective disorder ([place])   . Seasonal allergies   . Seizures ([place])    x1 - after head trauma. 1970's  . Vitreous detachment of left eye July 2012   [facility]    Surgical History:  Past Surgical History:  Procedure Laterality Date  . BASAL CELL CARCINOMA EXCISION Left    removed from above left eye     . CHOLECYSTECTOMY  2015  . COLONOSCOPY WITH PROPOFOL N/A 05/10/2015   Procedure: COLONOSCOPY WITH PROPOFOL;  Surgeon: Lucilla Lame, MD;  Location: [place];  Service: Endoscopy;  Laterality: N/A;  WITH BIOPSY-- SIGMOID COLON POLYP  X  4 DESCENDING COLON POLYP  . DILATATION & CURETTAGE/HYSTEROSCOPY WITH MYOSURE N/A 03/28/2016   Procedure: DILATATION & CURETTAGE/HYSTEROSCOPY WITH MYOSURE;  Surgeon: Boykin Nearing, MD;  Location: [facility];  Service: Gynecology;  Laterality: N/A;  . ESOPHAGOGASTRODUODENOSCOPY (EGD) WITH PROPOFOL N/A 09/02/2017   Procedure: ESOPHAGOGASTRODUODENOSCOPY (EGD) WITH PROPOFOL;  Surgeon: Lin Landsman, MD;  Location: [place];  Service: Endoscopy;  Laterality: N/A;  . MELANOMA EXCISION     pre melenoma excision on right foot   . OTHER SURGICAL HISTORY     Uterine Polyp    Medications:  Current Outpatient Medications on File Prior to Visit  Medication Sig  . Cholecalciferol (VITAMIN D3) 2000 UNITS TABS Take 2,000 Units by mouth daily.   . Multiple Vitamins-Minerals (PRESERVISION AREDS 2 PO) Take 1 tablet by mouth 2 (two) times daily.  . Turmeric 500 MG TABS turmeric (bulk)  . metroNIDAZOLE (METROGEL) 0.75 % gel Apply 1 application topically in the morning and at bedtime. (Patient not taking: Reported on 08/13/2020)   No current facility-administered medications on file prior to visit.    Allergies:  No Known Allergies  Social History:  Social History   Socioeconomic History  . Marital status: Married    Spouse name: Not on file  . Number of children: Not on file  . Years of education: Not on file  . Highest education level: High school graduate  Occupational History  . Occupation: retired  Tobacco Use  . Smoking status: Former Smoker    Packs/day: 1.00    Years: 30.00    Pack years: 30.00    Types: Cigarettes    Quit date: 03/13/2000    Years since quitting: 20.4  . Smokeless tobacco: Never Used  Vaping Use  .  Vaping Use: Never used  Substance and Sexual Activity  . Alcohol use: Not Currently    Alcohol/week: 0.0 standard drinks    Comment: occasional  . Drug use: No  . Sexual activity: Not Currently    Partners: Male  Other Topics Concern  . Not on file  Social History Narrative  . Not on file   Social Determinants of Health   Financial Resource Strain: Low Risk   . Difficulty of Paying Living Expenses: Not hard at all  Food Insecurity: No Food Insecurity  . Worried About Charity fundraiser in the Last Year: Never true  . Ran Out of Food in the  Last Year: Never true  Transportation Needs: No Transportation Needs  . Lack of Transportation (Medical): No  . Lack of Transportation (Non-Medical): No  Physical Activity: Inactive  . Days of Exercise per Week: 0 days  . Minutes of Exercise per Session: 0 min  Stress: No Stress Concern Present  . Feeling of Stress : Not at all  Social Connections:   . Frequency of Communication with Friends and Family: Not on file  . Frequency of Social Gatherings with Friends and Family: Not on file  . Attends Religious Services: Not on file  . Active Member of Clubs or Organizations: Not on file  . Attends Archivist Meetings: Not on file  . Marital Status: Not on file  Intimate Partner Violence:   . Fear of Current or Ex-Partner: Not on file  . Emotionally Abused: Not on file  . Physically Abused: Not on file  . Sexually Abused: Not on file   Social History   Tobacco Use  Smoking Status Former Smoker  . Packs/day: 1.00  . Years: 30.00  . Pack years: 30.00  . Types: Cigarettes  . Quit date: 03/13/2000  . Years since quitting: 20.4  Smokeless Tobacco Never Used   Social History   Substance and Sexual Activity  Alcohol Use Not Currently  . Alcohol/week: 0.0 standard drinks   Comment: occasional    Family History:  Family History  Problem Relation Age of Onset  . Cancer Mother        possible lung?  . Dementia Mother   .  Cancer Sister        kidney  . Diabetes Sister   . COPD Sister   . Cancer Paternal Grandfather        stomach and prostate  . Diabetes Sister   . Heart disease Sister   . Hypertension Sister   . COPD Sister   . Stroke Maternal Uncle   . Breast cancer Neg Hx     Past medical history, surgical history, medications, allergies, family history and social history reviewed with patient today and changes made to appropriate areas of the chart.   Review of Systems  Constitutional: Negative.   HENT: Negative.   Eyes: Negative.   Respiratory: Negative.   Cardiovascular: Negative.   Gastrointestinal: Negative.   Genitourinary: Negative.   Musculoskeletal: Negative.   Skin: Negative.   Neurological: Positive for headaches. Negative for dizziness, tingling, tremors, sensory change, speech change, focal weakness, seizures, loss of consciousness and weakness.  Endo/Heme/Allergies: Negative.   Psychiatric/Behavioral: Negative for depression, hallucinations, memory loss, substance abuse and suicidal ideas. The patient is nervous/anxious. The patient does not have insomnia.     All other ROS negative except what is listed above and in the HPI.      Objective:    BP 134/78 (BP Location: Left Arm, Cuff Size: Normal)   Pulse 66   Temp 97.8 F (36.6 C) (Oral)   Ht 5\' 4"  (1.626 m)   Wt 170 lb 3.2 oz (77.2 kg)   SpO2 99%   BMI 29.21 kg/m   Wt Readings from Last 3 Encounters:  08/13/20 170 lb 3.2 oz (77.2 kg)  08/13/20 169 lb (76.7 kg)  01/23/20 191 lb (86.6 kg)    Physical Exam Vitals and nursing note reviewed.  Constitutional:      General: She is not in acute distress.    Appearance: Normal appearance. She is not ill-appearing, toxic-appearing or diaphoretic.  HENT:     Head: Normocephalic  and atraumatic.     Right Ear: Tympanic membrane, ear canal and external ear normal. There is no impacted cerumen.     Left Ear: Tympanic membrane, ear canal and external ear normal. There is no  impacted cerumen.     Nose: Nose normal. No congestion or rhinorrhea.     Mouth/Throat:     Mouth: Mucous membranes are moist.     Pharynx: Oropharynx is clear. No oropharyngeal exudate or posterior oropharyngeal erythema.  Eyes:     General: No scleral icterus.       Right eye: No discharge.        Left eye: No discharge.     Extraocular Movements: Extraocular movements intact.     Conjunctiva/sclera: Conjunctivae normal.     Pupils: Pupils are equal, round, and reactive to light.  Neck:     Vascular: No carotid bruit.  Cardiovascular:     Rate and Rhythm: Normal rate and regular rhythm.     Pulses: Normal pulses.     Heart sounds: No murmur heard.  No friction rub. No gallop.   Pulmonary:     Effort: Pulmonary effort is normal. No respiratory distress.     Breath sounds: Normal breath sounds. No stridor. No wheezing, rhonchi or rales.  Chest:     Chest wall: No tenderness.  Abdominal:     General: Abdomen is flat. Bowel sounds are normal. There is no distension.     Palpations: Abdomen is soft. There is no mass.     Tenderness: There is no abdominal tenderness. There is no right CVA tenderness, left CVA tenderness, guarding or rebound.     Hernia: No hernia is present.  Genitourinary:    Comments: Breast and pelvic exams deferred with shared decision making Musculoskeletal:        General: No swelling, tenderness, deformity or signs of injury.     Cervical back: Normal range of motion and neck supple. No rigidity. No muscular tenderness.     Right lower leg: No edema.     Left lower leg: No edema.  Lymphadenopathy:     Cervical: No cervical adenopathy.  Skin:    General: Skin is warm and dry.     Capillary Refill: Capillary refill takes less than 2 seconds.     Coloration: Skin is not jaundiced or pale.     Findings: No bruising, erythema, lesion or rash.  Neurological:     General: No focal deficit present.     Mental Status: She is alert and oriented to person, place,  and time. Mental status is at baseline.     Cranial Nerves: No cranial nerve deficit.     Sensory: No sensory deficit.     Motor: No weakness.     Coordination: Coordination normal.     Gait: Gait normal.     Deep Tendon Reflexes: Reflexes normal.  Psychiatric:        Mood and Affect: Mood normal.        Behavior: Behavior normal.        Thought Content: Thought content normal.        Judgment: Judgment normal.     Results for orders placed or performed in visit on 08/13/20  Bayer DCA Hb A1c Waived  Result Value Ref Range   HB A1C (BAYER DCA - WAIVED) 5.6 <7.0 %  CBC with Differential/Platelet  Result Value Ref Range   WBC 7.8 3.4 - 10.8 x10E3/uL   RBC 4.76 3.77 - 5.28  x10E6/uL   Hemoglobin 14.8 11.1 - 15.9 g/dL   Hematocrit 44.4 34.0 - 46.6 %   MCV 93 79 - 97 fL   MCH 31.1 26.6 - 33.0 pg   MCHC 33.3 31 - 35 g/dL   RDW 12.2 11.7 - 15.4 %   Platelets 261 150 - 450 x10E3/uL   Neutrophils 49 Not Estab. %   Lymphs 42 Not Estab. %   Monocytes 7 Not Estab. %   Eos 2 Not Estab. %   Basos 0 Not Estab. %   Neutrophils Absolute 3.8 1.40 - 7.00 x10E3/uL   Lymphocytes Absolute 3.3 (H) 0 - 3 x10E3/uL   Monocytes Absolute 0.5 0 - 0 x10E3/uL   EOS (ABSOLUTE) 0.1 0.0 - 0.4 x10E3/uL   Basophils Absolute 0.0 0 - 0 x10E3/uL   Immature Granulocytes 0 Not Estab. %   Immature Grans (Abs) 0.0 0.0 - 0.1 x10E3/uL  Comprehensive metabolic panel  Result Value Ref Range   Glucose 91 65 - 99 mg/dL   BUN 17 8 - 27 mg/dL   Creatinine, Ser 0.51 (L) 0.57 - 1.00 mg/dL   GFR calc non Af Amer 98 >59 mL/min/1.73   GFR calc Af Amer 113 >59 mL/min/1.73   BUN/Creatinine Ratio 33 (H) 12 - 28   Sodium 141 134 - 144 mmol/L   Potassium 4.0 3.5 - 5.2 mmol/L   Chloride 100 96 - 106 mmol/L   CO2 31 (H) 20 - 29 mmol/L   Calcium 9.9 8.7 - 10.3 mg/dL   Total Protein 7.1 6.0 - 8.5 g/dL   Albumin 4.3 3.8 - 4.8 g/dL   Globulin, Total 2.8 1.5 - 4.5 g/dL   Albumin/Globulin Ratio 1.5 1.2 - 2.2   Bilirubin Total  0.2 0.0 - 1.2 mg/dL   Alkaline Phosphatase 84 44 - 121 IU/L   AST 18 0 - 40 IU/L   ALT 23 0 - 32 IU/L  Lipid Panel w/o Chol/HDL Ratio  Result Value Ref Range   Cholesterol, Total 246 (H) 100 - 199 mg/dL   Triglycerides 213 (H) 0 - 149 mg/dL   HDL 50 >39 mg/dL   VLDL Cholesterol Cal 39 5 - 40 mg/dL   LDL Chol Calc (NIH) 157 (H) 0 - 99 mg/dL  Microalbumin, Urine Waived  Result Value Ref Range   Microalb, Ur Waived 10 0 - 19 mg/L   Creatinine, Urine Waived 50 10 - 300 mg/dL   Microalb/Creat Ratio <30 <30 mg/g  TSH  Result Value Ref Range   TSH 1.790 0.450 - 4.500 uIU/mL  Urinalysis, Routine w reflex microscopic  Result Value Ref Range   Specific Gravity, UA 1.010 1.005 - 1.030   pH, UA 5.0 5.0 - 7.5   Color, UA Yellow Yellow   Appearance Ur Clear Clear   Leukocytes,UA Negative Negative   Protein,UA Negative Negative/Trace   Glucose, UA Negative Negative   Ketones, UA Negative Negative   RBC, UA Negative Negative   Bilirubin, UA Negative Negative   Urobilinogen, Ur 0.2 0.2 - 1.0 mg/dL   Nitrite, UA Negative Negative  VITAMIN D 25 Hydroxy (Vit-D Deficiency, Fractures)  Result Value Ref Range   Vit D, 25-Hydroxy 42.1 30.0 - 100.0 ng/mL      Assessment & Plan:   Problem List Items Addressed This Visit      Digestive   Fatty liver disease, nonalcoholic    Rechecking labs today. Await results. Treat as needed.       Relevant Orders   CBC  with Differential/Platelet (Completed)   Comprehensive metabolic panel (Completed)     Endocrine   IFG (impaired fasting glucose)    Rechecking labs today. Await results. Treat as needed.       Relevant Orders   Bayer DCA Hb A1c Waived (Completed)   CBC with Differential/Platelet (Completed)   Comprehensive metabolic panel (Completed)   Urinalysis, Routine w reflex microscopic (Completed)     Other   Hyperlipidemia    Under good control on current regimen. Continue current regimen. Continue to monitor. Call with any concerns.  Refills given. Labs drawn today.       Relevant Medications   hydrochlorothiazide (HYDRODIURIL) 25 MG tablet   Other Relevant Orders   CBC with Differential/Platelet (Completed)   Comprehensive metabolic panel (Completed)   Lipid Panel w/o Chol/HDL Ratio (Completed)   Depression    Under good control on current regimen. Continue current regimen. Continue to monitor. Call with any concerns. Refills given.        Relevant Orders   CBC with Differential/Platelet (Completed)   Comprehensive metabolic panel (Completed)   TSH (Completed)   Vitamin D deficiency    Rechecking labs today. Await results. Treat as needed.       Relevant Orders   CBC with Differential/Platelet (Completed)   Comprehensive metabolic panel (Completed)   VITAMIN D 25 Hydroxy (Vit-D Deficiency, Fractures) (Completed)    Other Visit Diagnoses    Routine general medical examination at a health care facility    -  Primary   Vaccines up to date. Screening labs checked today. Mammogram, DEXA and colonoscopy up to date. Continue diet and exercise. Call with any concerns.   Essential hypertension       Relevant Medications   hydrochlorothiazide (HYDRODIURIL) 25 MG tablet   Other Relevant Orders   CBC with Differential/Platelet (Completed)   Comprehensive metabolic panel (Completed)   Microalbumin, Urine Waived (Completed)   Need for influenza vaccination       Flu shot given today.   Relevant Orders   Flu Vaccine QUAD High Dose(Fluad) (Completed)       Follow up plan: Return in about 6 months (around 02/10/2021).   LABORATORY TESTING:  - Pap smear: not applicable  IMMUNIZATIONS:   - Tdap: Tetanus vaccination status reviewed: last tetanus booster within 10 years. - Influenza: Administered today - Pneumovax: Up to date - Prevnar: Up to date - COVID: Up to date  SCREENING: -Mammogram: Up to date  - Colonoscopy: will check if she's due  - Bone Density: Up to date   PATIENT COUNSELING:   Advised to  take 1 mg of folate supplement per day if capable of pregnancy.   Sexuality: Discussed sexually transmitted diseases, partner selection, use of condoms, avoidance of unintended pregnancy  and contraceptive alternatives.   Advised to avoid cigarette smoking.  I discussed with the patient that most people either abstain from alcohol or drink within safe limits (<=14/week and <=4 drinks/occasion for males, <=7/weeks and <= 3 drinks/occasion for females) and that the risk for alcohol disorders and other health effects rises proportionally with the number of drinks per week and how often a drinker exceeds daily limits.  Discussed cessation/primary prevention of drug use and availability of treatment for abuse.   Diet: Encouraged to adjust caloric intake to maintain  or achieve ideal body weight, to reduce intake of dietary saturated fat and total fat, to limit sodium intake by avoiding high sodium foods and not adding table salt, and to maintain adequate  dietary potassium and calcium preferably from fresh fruits, vegetables, and low-fat dairy products.    stressed the importance of regular exercise  Injury prevention: Discussed safety belts, safety helmets, smoke detector, smoking near bedding or upholstery.   Dental health: Discussed importance of regular tooth brushing, flossing, and dental visits.    NEXT PREVENTATIVE PHYSICAL DUE IN 1 YEAR. Return in about 6 months (around 02/10/2021).

## 2020-08-13 NOTE — Progress Notes (Signed)
I connected with Miara Emminger today by telephone and verified that I am speaking with the correct person using two identifiers. Location patient: home Location provider: work Persons participating in the virtual visit: Graylee Arutyunyan, Glenna Durand LPN.   I discussed the limitations, risks, security and privacy concerns of performing an evaluation and management service by telephone and the availability of in person appointments. I also discussed with the patient that there may be a patient responsible charge related to this service. The patient expressed understanding and verbally consented to this telephonic visit.    Interactive audio and video telecommunications were attempted between this provider and patient, however failed, due to patient having technical difficulties OR patient did not have access to video capability.  We continued and completed visit with audio only.     Vital signs may be patient reported or missing.  Subjective:   AJANAE VIRAG is a 69 y.o. female who presents for Medicare Annual (Subsequent) preventive examination.  Review of Systems     Cardiac Risk Factors include: advanced age (>76men, >25 women)     Objective:    Today's Vitals   08/13/20 1343  Weight: 169 lb (76.7 kg)  Height: 5\' 5"  (1.651 m)   Body mass index is 28.12 kg/m.  Advanced Directives 08/13/2020 05/16/2019 05/12/2018 09/02/2017 05/07/2017 04/21/2016 03/28/2016  Does Patient Have a Medical Advance Directive? No Yes No No No No No  Type of Advance Directive - Living will;Healthcare Power of Attorney - - - - -  Does patient want to make changes to medical advance directive? - - - - - - -  Copy of [place] in Chart? - No - copy requested - - - - -  Would patient like information on creating a medical advance directive? - - Yes (MAU/Ambulatory/Procedural Areas - Information given) No - Patient declined Yes (MAU/Ambulatory/Procedural Areas - Information given) Yes -  Educational materials given -    Current Medications (verified) Outpatient Encounter Medications as of 08/13/2020  Medication Sig  . Calcium 500 MG CHEW Chew by mouth.  . Cholecalciferol (VITAMIN D3) 2000 UNITS TABS Take 2,000 Units by mouth daily.   . hydrochlorothiazide (HYDRODIURIL) 25 MG tablet Take 1 tablet (25 mg total) by mouth daily.  . Multiple Vitamins-Minerals (PRESERVISION AREDS 2 PO) Take 1 tablet by mouth 2 (two) times daily.  . Turmeric 500 MG TABS turmeric (bulk)  . metroNIDAZOLE (METROGEL) 0.75 % gel Apply 1 application topically in the morning and at bedtime. (Patient not taking: Reported on 08/13/2020)   No facility-administered encounter medications on file as of 08/13/2020.    Allergies (verified) Patient has no known allergies.   History: Past Medical History:  Diagnosis Date  . Actinic keratosis   . Anxiety   . Arthritis    thumbs  . Basal cell carcinoma 10/20/2017   just under left brow  . Depression   . Dysplastic nevus 01/19/2018   right distal dorsum foot  . Fatty liver disease, nonalcoholic   . GERD (gastroesophageal reflux disease)   . Heart murmur   . Hyperlipidemia   . Hypertension   . Kidney cysts   . Motion sickness    reading in cars  . PONV (postoperative nausea and vomiting)    slow to wake  . Rosacea   . Seasonal affective disorder ([place])   . Seasonal allergies   . Seizures ([place])    x1 - after head trauma. 1970's  . Vitreous detachment of left eye July  2012   [facility]   Past Surgical History:  Procedure Laterality Date  . BASAL CELL CARCINOMA EXCISION Left    removed from above left eye   . CHOLECYSTECTOMY  2015  . COLONOSCOPY WITH PROPOFOL N/A 05/10/2015   Procedure: COLONOSCOPY WITH PROPOFOL;  Surgeon: Lucilla Lame, MD;  Location: [facility];  Service: Endoscopy;  Laterality: N/A;  WITH BIOPSY-- SIGMOID COLON POLYP  X  4 DESCENDING COLON POLYP  . DILATATION & CURETTAGE/HYSTEROSCOPY WITH MYOSURE N/A  03/28/2016   Procedure: DILATATION & CURETTAGE/HYSTEROSCOPY WITH MYOSURE;  Surgeon: Boykin Nearing, MD;  Location: [facility];  Service: Gynecology;  Laterality: N/A;  . ESOPHAGOGASTRODUODENOSCOPY (EGD) WITH PROPOFOL N/A 09/02/2017   Procedure: ESOPHAGOGASTRODUODENOSCOPY (EGD) WITH PROPOFOL;  Surgeon: Lin Landsman, MD;  Location: [facility];  Service: Endoscopy;  Laterality: N/A;  . MELANOMA EXCISION     pre melenoma excision on right foot   . OTHER SURGICAL HISTORY     Uterine Polyp   Family History  Problem Relation Age of Onset  . Cancer Mother        possible lung?  . Dementia Mother   . Cancer Sister        kidney  . Diabetes Sister   . COPD Sister   . Cancer Paternal Grandfather        stomach and prostate  . Diabetes Sister   . Heart disease Sister   . Hypertension Sister   . COPD Sister   . Stroke Maternal Uncle   . Breast cancer Neg Hx    Social History   Socioeconomic History  . Marital status: Married    Spouse name: Not on file  . Number of children: Not on file  . Years of education: Not on file  . Highest education level: High school graduate  Occupational History  . Occupation: retired  Tobacco Use  . Smoking status: Former Smoker    Packs/day: 1.00    Years: 30.00    Pack years: 30.00    Types: Cigarettes    Quit date: 03/13/2000    Years since quitting: 20.4  . Smokeless tobacco: Never Used  Vaping Use  . Vaping Use: Never used  Substance and Sexual Activity  . Alcohol use: Not Currently    Alcohol/week: 0.0 standard drinks    Comment: occasional  . Drug use: No  . Sexual activity: Not Currently    Partners: Male  Other Topics Concern  . Not on file  Social History Narrative  . Not on file   Social Determinants of Health   Financial Resource Strain: Low Risk   . Difficulty of Paying Living Expenses: Not hard at all  Food Insecurity: No Food Insecurity  . Worried About Charity fundraiser in the Last Year: Never true    . Ran Out of Food in the Last Year: Never true  Transportation Needs: No Transportation Needs  . Lack of Transportation (Medical): No  . Lack of Transportation (Non-Medical): No  Physical Activity: Inactive  . Days of Exercise per Week: 0 days  . Minutes of Exercise per Session: 0 min  Stress: No Stress Concern Present  . Feeling of Stress : Not at all  Social Connections:   . Frequency of Communication with Friends and Family: Not on file  . Frequency of Social Gatherings with Friends and Family: Not on file  . Attends Religious Services: Not on file  . Active Member of Clubs or Organizations: Not on file  .  Attends Archivist Meetings: Not on file  . Marital Status: Not on file    Tobacco Counseling Counseling given: Not Answered   Clinical Intake:  Pre-visit preparation completed: Yes  Pain : No/denies pain     Nutritional Status: BMI 25 -29 Overweight Nutritional Risks: Nausea/ vomitting/ diarrhea (episode of vomiting with migraine) Diabetes: No  How often do you need to have someone help you when you read instructions, pamphlets, or other written materials from your doctor or pharmacy?: 1 - Never What is the last grade level you completed in school?: technical school  Diabetic? no  Interpreter Needed?: No  Information entered by :: NAllen LPN   Activities of Daily Living In your present state of health, do you have any difficulty performing the following activities: 08/13/2020  Hearing? N  Vision? Y  Comment trouble at night time  Difficulty concentrating or making decisions? N  Walking or climbing stairs? N  Dressing or bathing? N  Doing errands, shopping? N  Preparing Food and eating ? N  Using the Toilet? N  In the past six months, have you accidently leaked urine? Y  Comment if waits too long  Do you have problems with loss of bowel control? N  Managing your Medications? N  Managing your Finances? N  Housekeeping or managing your  Housekeeping? N  Some recent data might be hidden    Patient Care Team: Valerie Roys, DO as PCP - General (Family Medicine) Lucilla Lame, MD as Consulting Physician (Gastroenterology) Schermerhorn, Gwen Her, MD as Referring Physician (Obstetrics and Gynecology) [facility], [facility] (Dermatology) Lyla Glassing, MD as Referring Physician (Ophthalmology)  Indicate any recent Medical Services you may have received from other than [facility] providers in the past year (date may be approximate).     Assessment:   This is a routine wellness examination for [place].  Hearing/Vision screen  Hearing Screening   125Hz  250Hz  500Hz  1000Hz  2000Hz  3000Hz  4000Hz  6000Hz  8000Hz   Right ear:           Left ear:           Vision Screening Comments: Regular eye exams, Dr. Edison Pace, [facility]  Dietary issues and exercise activities discussed: Current Exercise Habits: The patient does not participate in regular exercise at present (does lots of yard work)  Goals    . DIET - INCREASE WATER INTAKE     Recommend drinking at least 6-8 glasses of water a day     . Increase water intake     Recommend drinking at least 5-6 glasses of water a day.     . Patient Stated     08/13/2020, wants to eat healthy with low carbs and sugar    . RNCM: Sometimes my blood pressure is high     Current Barriers:  . Chronic Disease Management support, education, and care coordination needs related to HTN, HLD, and Abdominal Aortic Atherosclerosis   Clinical Goal(s) related to HTN, HLD, and Abdominal Aortic Atherosclerosis :  Over the next 60 days, patient will:  . Work with the care management team to address educational, disease management, and care coordination needs  . Begin or continue self health monitoring activities as directed today Measure and record blood pressure 2 or more times per week . Call provider office for new or worsened signs and symptoms Blood pressure  findings outside established parameters and New or worsened symptom related to chronic medical conditions . Call care management team  with questions or concerns . Verbalize basic understanding of patient centered plan of care established today  Interventions related to HTN, HLD, and Abdominal Aortic Atherosclerosis :  . Evaluation of current treatment plans and patient's adherence to plan as established by provider . Assessed patient understanding of disease states . Assessed patient's education and care coordination needs . Provided disease specific education to patient  . Collaborated with appropriate clinical care team members regarding patient needs . Review of Heart Healthy Diet and eating habits . Evaluation of activity levels and weight management  Patient Self Care Activities related to : HTN, HLD, and Abdominal Aortic Atherosclerosis  . Patient is unable to independently self-manage chronic health conditions  Initial goal documentation       Depression Screen PHQ 2/9 Scores 08/13/2020 01/23/2020 07/25/2019 05/16/2019 05/25/2018 05/12/2018 05/14/2017  PHQ - 2 Score 0 0 0 0 0 0 0  PHQ- 9 Score 0 0 0 - 0 - 0    Fall Risk Fall Risk  08/13/2020 01/23/2020 07/25/2019 05/16/2019 12/14/2018  Falls in the past year? 0 0 0 0 0  Number falls in past yr: - 0 0 - -  Injury with Fall? - 0 0 - -  Risk for fall due to : Medication side effect - - - -  Follow up Falls evaluation completed;Education provided;Falls prevention discussed - - - -    Any stairs in or around the home? Yes  If so, are there any without handrails? Yes  Home free of loose throw rugs in walkways, pet beds, electrical cords, etc? Yes  Adequate lighting in your home to reduce risk of falls? Yes   ASSISTIVE DEVICES UTILIZED TO PREVENT FALLS:  Life alert? No  Use of a cane, walker or w/c? No  Grab bars in the bathroom? Yes  Shower chair or bench in shower? No  Elevated toilet seat or a handicapped toilet? No   TIMED UP AND  GO:  Was the test performed? No . .     Cognitive Function:     6CIT Screen 08/13/2020 05/12/2018 05/07/2017  What Year? 0 points 0 points 0 points  What month? 0 points 0 points 0 points  What time? 0 points 0 points 0 points  Count back from 20 0 points 0 points 0 points  Months in reverse 0 points 0 points 0 points  Repeat phrase 0 points 0 points 0 points  Total Score 0 0 0    Immunizations Immunization History  Administered Date(s) Administered  . Fluad Quad(high Dose 65+) 07/25/2019  . Influenza-Unspecified 08/18/2014, 09/20/2018  . PFIZER SARS-COV-2 Vaccination 11/22/2019, 12/13/2019, 08/02/2020  . Pneumococcal Conjugate-13 05/03/2015  . Pneumococcal Polysaccharide-23 04/21/2016  . Tdap 05/16/2013  . Zoster Recombinat (Shingrix) 03/09/2018, 06/04/2018    TDAP status: Up to date Flu Vaccine status: Completed at today's visit Pneumococcal vaccine status: Up to date Covid-19 vaccine status: Completed vaccines  Qualifies for Shingles Vaccine? Yes   Zostavax completed No   Shingrix Completed?: Yes  Screening Tests Health Maintenance  Topic Date Due  . COLONOSCOPY  05/09/2020  . INFLUENZA VACCINE  05/13/2020  . MAMMOGRAM  07/21/2021  . TETANUS/TDAP  05/17/2023  . DEXA SCAN  Completed  . COVID-19 Vaccine  Completed  . Hepatitis C Screening  Completed  . PNA vac Low Risk Adult  Completed    Health Maintenance  Health Maintenance Due  Topic Date Due  . COLONOSCOPY  05/09/2020  . INFLUENZA VACCINE  05/13/2020    Colorectal cancer  screening: Completed 05/10/2015. Repeat every 5 years Mammogram status: Completed 07/22/2019. Repeat every 2 years Bone Density status: Completed 04/14/2016. Results reflect: Bone density results: NORMAL. Repeat every 0 years.  Lung Cancer Screening: (Low Dose CT Chest recommended if Age 72-80 years, 30 pack-year currently smoking OR have quit w/in 15years.) does not qualify.   Lung Cancer Screening Referral: no  Additional  Screening:  Hepatitis C Screening: does qualify; Completed 02/11/2016  Vision Screening: Recommended annual ophthalmology exams for early detection of glaucoma and other disorders of the eye. Is the patient up to date with their annual eye exam?  Yes  Who is the provider or what is the name of the office in which the patient attends annual eye exams? [facility] If pt is not established with a provider, would they like to be referred to a provider to establish care? No .   Dental Screening: Recommended annual dental exams for proper oral hygiene  Community Resource Referral / Chronic Care Management: CRR required this visit?  No   CCM required this visit?  No      Plan:     I have personally reviewed and noted the following in the patient's chart:   . Medical and social history . Use of alcohol, tobacco or illicit drugs  . Current medications and supplements . Functional ability and status . Nutritional status . Physical activity . Advanced directives . List of other physicians . Hospitalizations, surgeries, and ER visits in previous 12 months . Vitals . Screenings to include cognitive, depression, and falls . Referrals and appointments  In addition, I have reviewed and discussed with patient certain preventive protocols, quality metrics, and best practice recommendations. A written personalized care plan for preventive services as well as general preventive health recommendations were provided to patient.     Kellie Simmering, LPN   [id number]   Nurse Notes: Patient is due for colonoscopy

## 2020-08-14 LAB — CBC WITH DIFFERENTIAL/PLATELET
Basophils Absolute: 0 10*3/uL (ref 0.0–0.2)
Basos: 0 %
EOS (ABSOLUTE): 0.1 10*3/uL (ref 0.0–0.4)
Eos: 2 %
Hematocrit: 44.4 % (ref 34.0–46.6)
Hemoglobin: 14.8 g/dL (ref 11.1–15.9)
Immature Grans (Abs): 0 10*3/uL (ref 0.0–0.1)
Immature Granulocytes: 0 %
Lymphocytes Absolute: 3.3 10*3/uL — ABNORMAL HIGH (ref 0.7–3.1)
Lymphs: 42 %
MCH: 31.1 pg (ref 26.6–33.0)
MCHC: 33.3 g/dL (ref 31.5–35.7)
MCV: 93 fL (ref 79–97)
Monocytes Absolute: 0.5 10*3/uL (ref 0.1–0.9)
Monocytes: 7 %
Neutrophils Absolute: 3.8 10*3/uL (ref 1.4–7.0)
Neutrophils: 49 %
Platelets: 261 10*3/uL (ref 150–450)
RBC: 4.76 x10E6/uL (ref 3.77–5.28)
RDW: 12.2 % (ref 11.7–15.4)
WBC: 7.8 10*3/uL (ref 3.4–10.8)

## 2020-08-14 LAB — COMPREHENSIVE METABOLIC PANEL
ALT: 23 IU/L (ref 0–32)
AST: 18 IU/L (ref 0–40)
Albumin/Globulin Ratio: 1.5 (ref 1.2–2.2)
Albumin: 4.3 g/dL (ref 3.8–4.8)
Alkaline Phosphatase: 84 IU/L (ref 44–121)
BUN/Creatinine Ratio: 33 — ABNORMAL HIGH (ref 12–28)
BUN: 17 mg/dL (ref 8–27)
Bilirubin Total: 0.2 mg/dL (ref 0.0–1.2)
CO2: 31 mmol/L — ABNORMAL HIGH (ref 20–29)
Calcium: 9.9 mg/dL (ref 8.7–10.3)
Chloride: 100 mmol/L (ref 96–106)
Creatinine, Ser: 0.51 mg/dL — ABNORMAL LOW (ref 0.57–1.00)
GFR calc Af Amer: 113 mL/min/{1.73_m2} (ref >59)
GFR calc non Af Amer: 98 mL/min/{1.73_m2} (ref >59)
Globulin, Total: 2.8 g/dL (ref 1.5–4.5)
Glucose: 91 mg/dL (ref 65–99)
Potassium: 4 mmol/L (ref 3.5–5.2)
Sodium: 141 mmol/L (ref 134–144)
Total Protein: 7.1 g/dL (ref 6.0–8.5)

## 2020-08-14 LAB — LIPID PANEL W/O CHOL/HDL RATIO
Cholesterol, Total: 246 mg/dL — ABNORMAL HIGH (ref 100–199)
HDL: 50 mg/dL (ref >39)
LDL Chol Calc (NIH): 157 mg/dL — ABNORMAL HIGH (ref 0–99)
Triglycerides: 213 mg/dL — ABNORMAL HIGH (ref 0–149)
VLDL Cholesterol Cal: 39 mg/dL (ref 5–40)

## 2020-08-14 LAB — TSH: TSH: 1.79 u[IU]/mL (ref 0.450–4.500)

## 2020-08-14 LAB — VITAMIN D 25 HYDROXY (VIT D DEFICIENCY, FRACTURES): Vit D, 25-Hydroxy: 42.1 ng/mL (ref 30.0–100.0)

## 2020-08-14 NOTE — Assessment & Plan Note (Signed)
Under good control on current regimen. Continue current regimen. Continue to monitor. Call with any concerns. Refills given.   

## 2020-08-14 NOTE — Assessment & Plan Note (Signed)
Rechecking labs today. Await results. Treat as needed.  °

## 2020-08-14 NOTE — Assessment & Plan Note (Signed)
Under good control on current regimen. Continue current regimen. Continue to monitor. Call with any concerns. Refills given. Labs drawn today.   

## 2020-08-19 ENCOUNTER — Encounter: Payer: Self-pay | Admitting: Family Medicine

## 2020-10-16 IMAGING — MG MM DIGITAL SCREENING BILAT W/ TOMO W/ CAD
6 of 10 series · 6 of 30 positions shown · non-contrast
Comparison: Previous exam(s).

CLINICAL DATA: Screening.

EXAM:
DIGITAL SCREENING BILATERAL MAMMOGRAM WITH TOMO AND CAD

[R CC synth-2D]
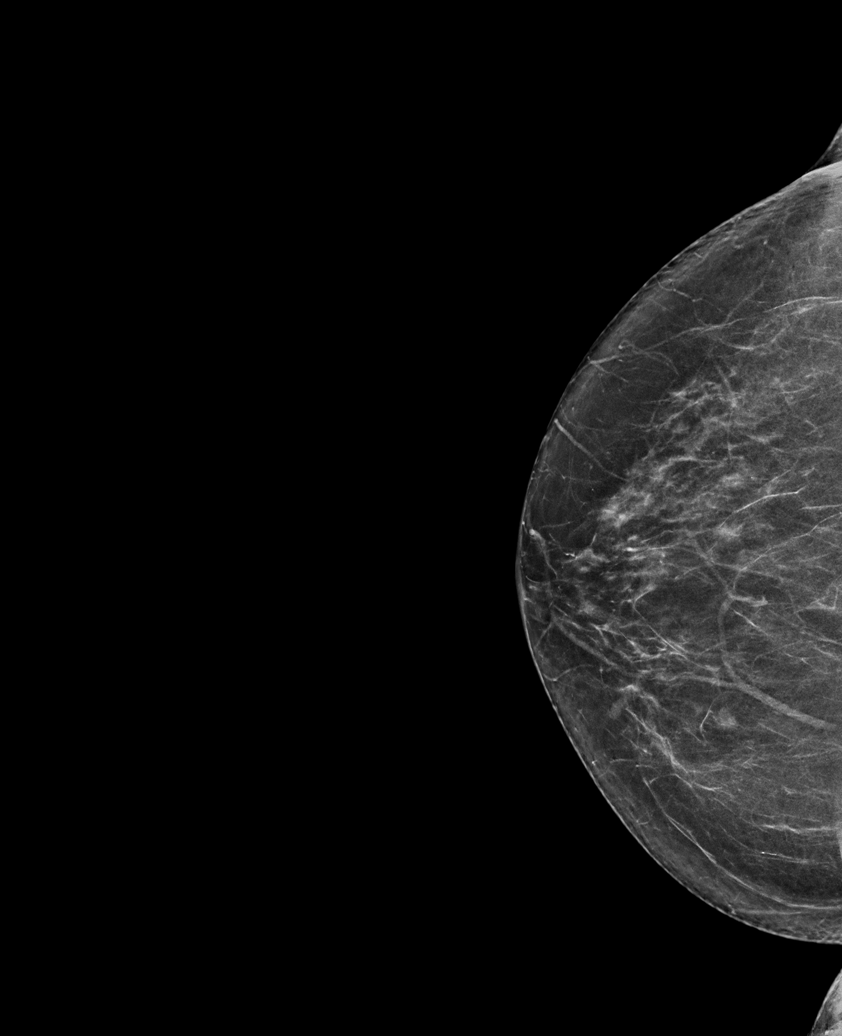

[R MLO synth-2D (1 of 2)]
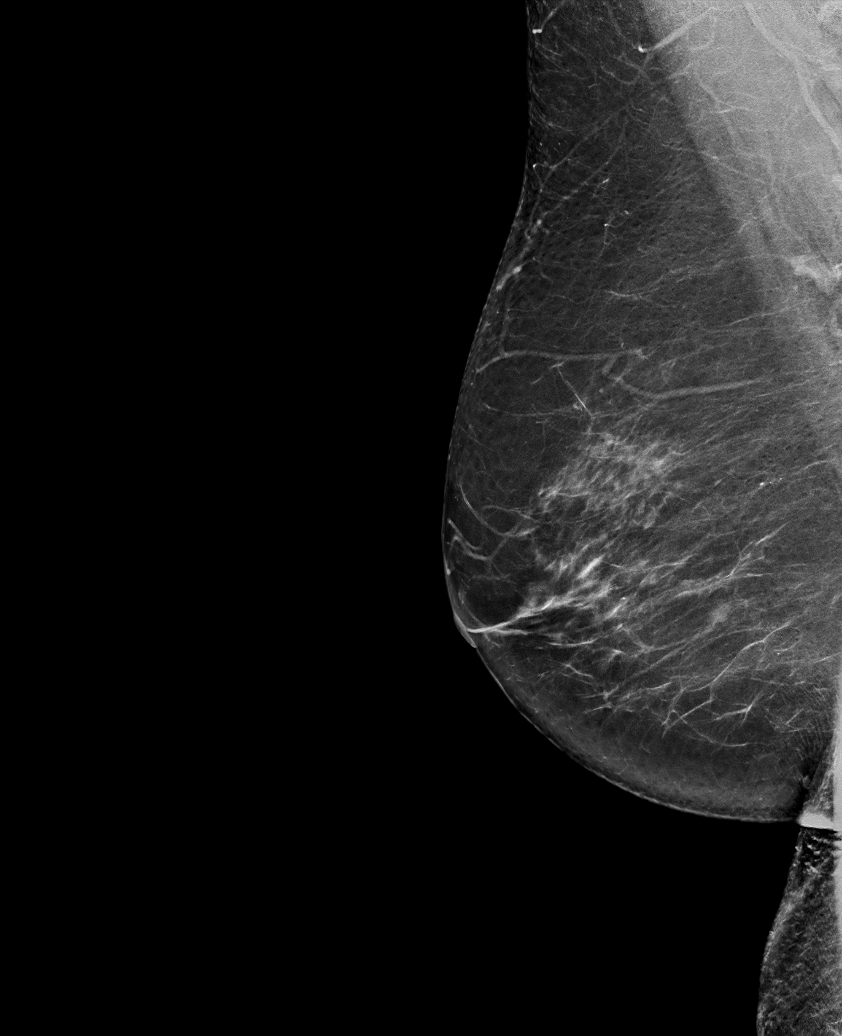

[R MLO synth-2D (2 of 2)]
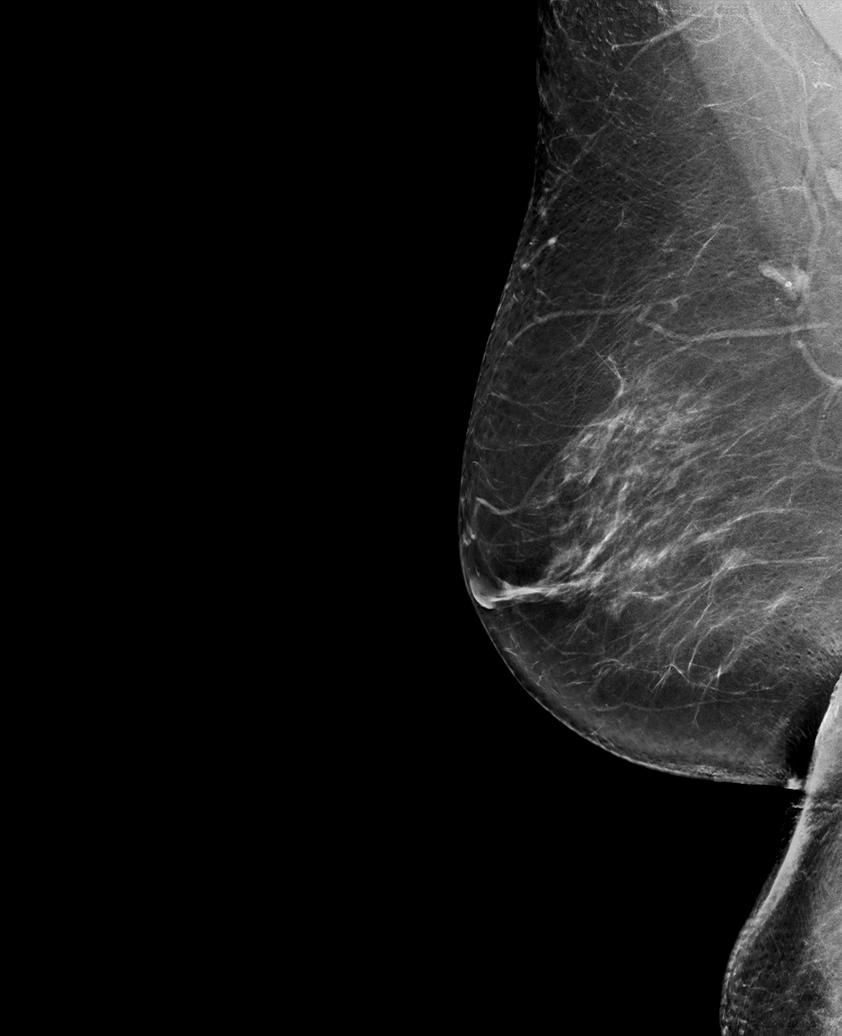

[L MLO synth-2D]
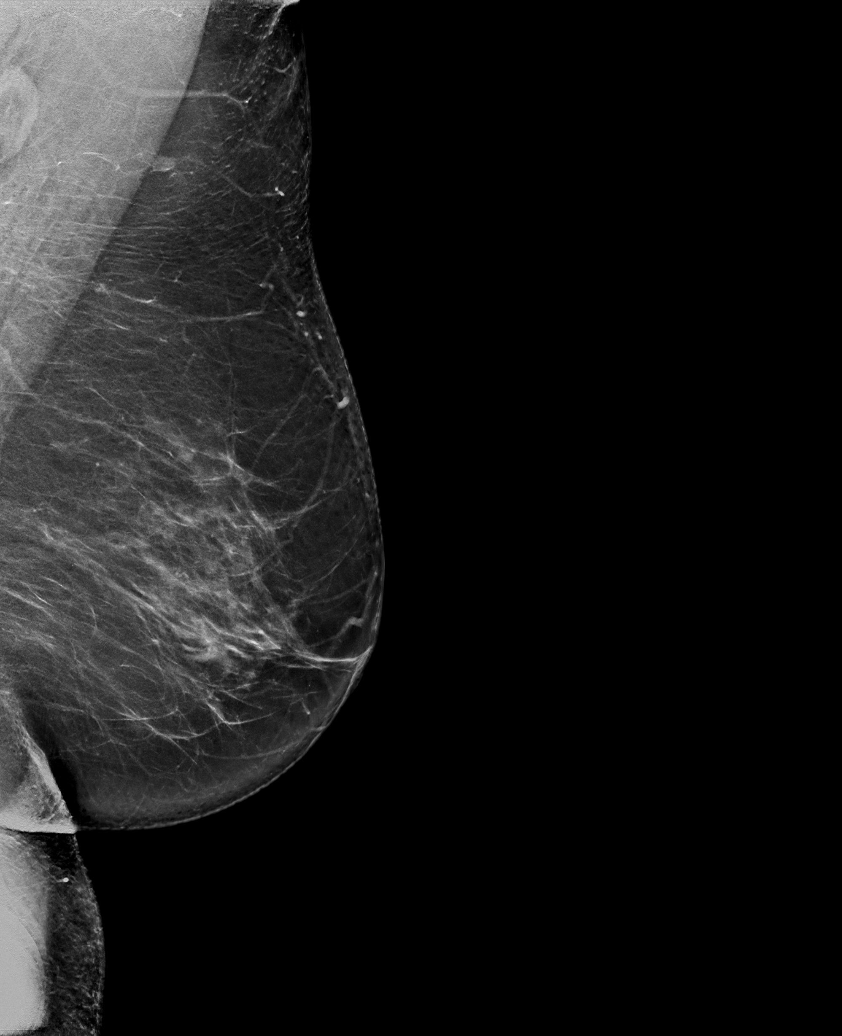

[L CC synth-2D]
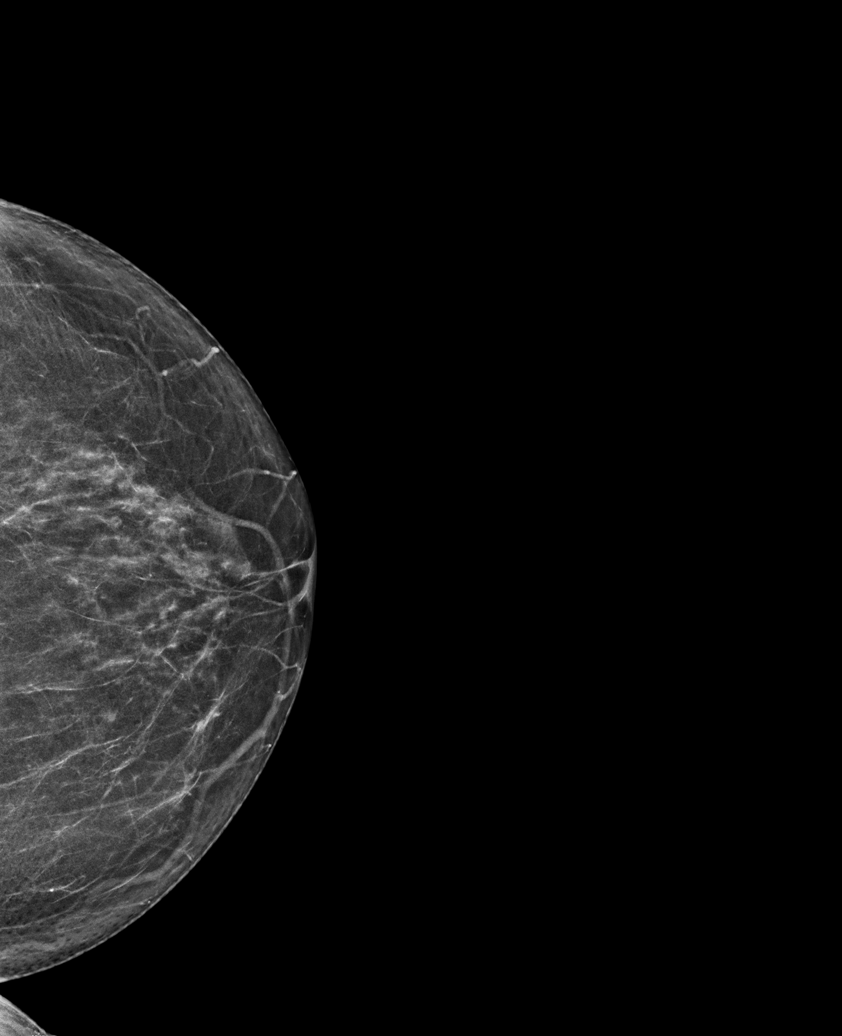

[L CC tomo · tomo slice 29/57.0]
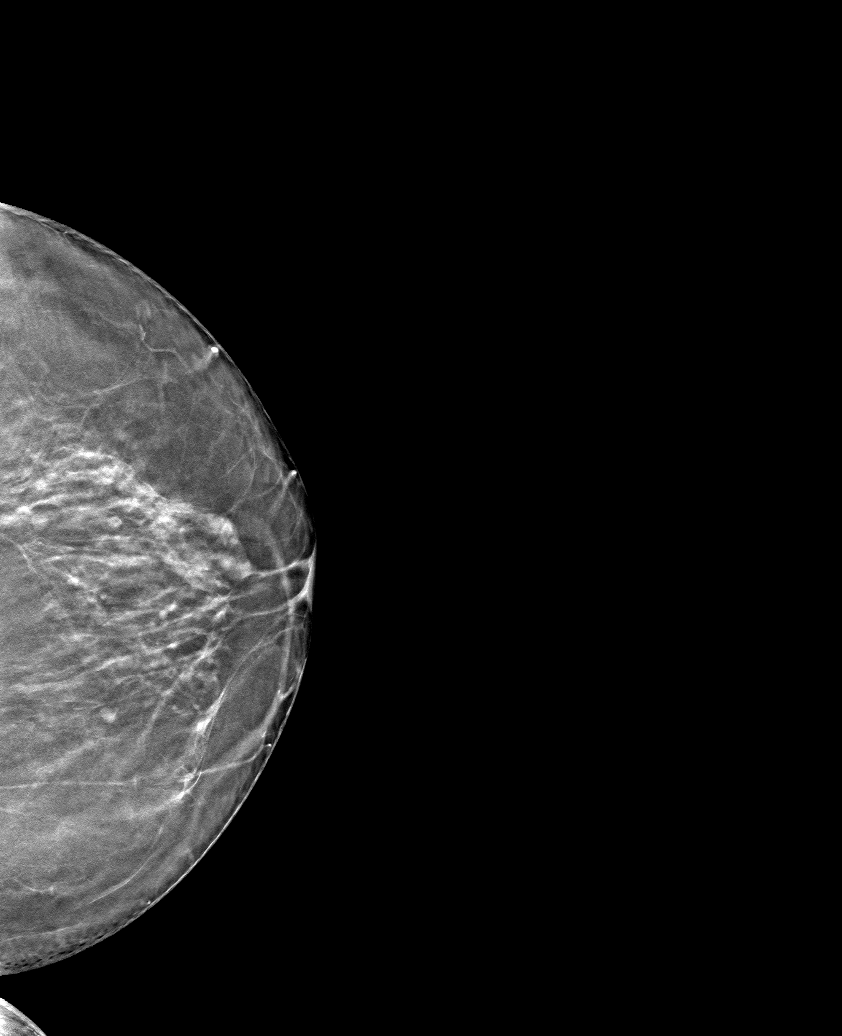

[6 of 30 positions shown; findings below may reference images not displayed]

ACR Breast Density Category b: There are scattered areas of
fibroglandular density.
FINDINGS: There are no findings suspicious for malignancy. Images were
processed with CAD.
IMPRESSION: No mammographic evidence of malignancy. A result letter of this
screening mammogram will be mailed directly to the patient.

RECOMMENDATION:
Screening mammogram in one year. (Code:[id number])

BI-RADS CATEGORY  1: Negative.

## 2021-02-12 ENCOUNTER — Ambulatory Visit: Payer: Medicare Other | Admitting: Family Medicine

## 2021-02-20 ENCOUNTER — Encounter: Payer: Self-pay | Admitting: Family Medicine

## 2021-02-20 ENCOUNTER — Telehealth (INDEPENDENT_AMBULATORY_CARE_PROVIDER_SITE_OTHER): Payer: Medicare Other | Admitting: Family Medicine

## 2021-02-20 VITALS — BP 135/73 | HR 73 | Ht 63.86 in | Wt 170.0 lb

## 2021-02-20 DIAGNOSIS — J069 Acute upper respiratory infection, unspecified: Secondary | ICD-10-CM

## 2021-02-20 DIAGNOSIS — R7301 Impaired fasting glucose: Secondary | ICD-10-CM

## 2021-02-20 DIAGNOSIS — E559 Vitamin D deficiency, unspecified: Secondary | ICD-10-CM | POA: Diagnosis not present

## 2021-02-20 DIAGNOSIS — I10 Essential (primary) hypertension: Secondary | ICD-10-CM

## 2021-02-20 DIAGNOSIS — E782 Mixed hyperlipidemia: Secondary | ICD-10-CM

## 2021-02-20 MED ORDER — HYDROCHLOROTHIAZIDE 25 MG PO TABS
25.0000 mg | ORAL_TABLET | Freq: Every day | ORAL | 1 refills | Status: DC
Start: 2021-02-20 — End: 2021-09-10

## 2021-02-20 NOTE — Assessment & Plan Note (Signed)
Labs to be drawn in the next few days- await results. Treat as needed.  

## 2021-02-20 NOTE — Assessment & Plan Note (Signed)
Under good control on current regimen. Continue current regimen. Continue to monitor. Call with any concerns. Refills given. Labs to be drawn in the next few days.

## 2021-02-20 NOTE — Assessment & Plan Note (Signed)
Labs to be drawn in the next few days- await results. Treat as needed.

## 2021-02-20 NOTE — Progress Notes (Signed)
BP 135/73   Pulse 73   Ht 5' 3.86" (1.622 m)   Wt 170 lb (77.1 kg)   BMI 29.31 kg/m    Subjective:    Patient ID: Mary Galvan, female    DOB: 1950/10/27, 70 y.o.   MRN: [id number]  HPI: Mary Galvan is a 70 y.o. female  Chief Complaint  Patient presents with  . Hypertension  . Sore Throat    Started on Monday morning  . Cough  . Fatigue  . Nasal Congestion   HYPERTENSION / [place] Satisfied with current treatment? yes Duration of hypertension: chronic BP monitoring frequency: a few times a week BP medication side effects: no Past BP meds: HCTZ Duration of hyperlipidemia: chronic Cholesterol medication side effects: not on anything Cholesterol supplements: none Past cholesterol medications: none Medication compliance: excellent compliance Aspirin: no Recent stressors: no Recurrent headaches: no Visual changes: no Palpitations: no Dyspnea: no Chest pain: no Lower extremity edema: no Dizzy/lightheaded: no  UPPER RESPIRATORY TRACT INFECTION- has not taken a COVID test Duration: 2-3 days Worst symptom: congestion Fever: no Cough: yes Shortness of breath: no Wheezing: no Chest pain: no Chest tightness: no Chest congestion: no Nasal congestion: yes Runny nose: yes Post nasal drip: no Sneezing: no Sore throat: yes Swollen glands: no Sinus pressure: no Headache: no Face pain: no Toothache: no Ear pain: no  Ear pressure: no  Eyes red/itching:no Eye drainage/crusting: no  Vomiting: no Rash: no Fatigue: yes Sick contacts: yes Strep contacts: no  Context: better Recurrent sinusitis: no Relief with OTC cold/cough medications: no  Treatments attempted: cold/sinus, mucinex and anti-histamine   Relevant past medical, surgical, family and social history reviewed and updated as indicated. Interim medical history since our last visit reviewed. Allergies and medications reviewed and updated.  Review of Systems  Constitutional: Negative.    HENT: Positive for congestion, postnasal drip, rhinorrhea and sore throat. Negative for dental problem, drooling, ear discharge, ear pain, facial swelling, hearing loss, mouth sores, nosebleeds, sinus pressure, sinus pain, sneezing, tinnitus, trouble swallowing and voice change.   Respiratory: Positive for cough. Negative for apnea, choking, chest tightness, shortness of breath, wheezing and stridor.   Cardiovascular: Negative.   Gastrointestinal: Negative.   Musculoskeletal: Negative.   Psychiatric/Behavioral: Negative.     Per HPI unless specifically indicated above     Objective:    BP 135/73   Pulse 73   Ht 5' 3.86" (1.622 m)   Wt 170 lb (77.1 kg)   BMI 29.31 kg/m   Wt Readings from Last 3 Encounters:  02/20/21 170 lb (77.1 kg)  08/13/20 170 lb 3.2 oz (77.2 kg)  08/13/20 169 lb (76.7 kg)    Physical Exam Vitals and nursing note reviewed.  Constitutional:      General: She is not in acute distress.    Appearance: Normal appearance. She is not ill-appearing, toxic-appearing or diaphoretic.  HENT:     Head: Normocephalic and atraumatic.     Right Ear: External ear normal.     Left Ear: External ear normal.     Nose: Nose normal.     Mouth/Throat:     Mouth: Mucous membranes are moist.     Pharynx: Oropharynx is clear.  Eyes:     General: No scleral icterus.       Right eye: No discharge.        Left eye: No discharge.     Conjunctiva/sclera: Conjunctivae normal.     Pupils: Pupils are equal, round, and  reactive to light.  Pulmonary:     Effort: Pulmonary effort is normal. No respiratory distress.     Comments: Speaking in full sentences Musculoskeletal:        General: Normal range of motion.     Cervical back: Normal range of motion.  Skin:    Coloration: Skin is not jaundiced or pale.     Findings: No bruising, erythema, lesion or rash.  Neurological:     Mental Status: She is alert and oriented to person, place, and time. Mental status is at baseline.   Psychiatric:        Mood and Affect: Mood normal.        Behavior: Behavior normal.        Thought Content: Thought content normal.        Judgment: Judgment normal.     Results for orders placed or performed in visit on 08/13/20  Bayer DCA Hb A1c Waived  Result Value Ref Range   HB A1C (BAYER DCA - WAIVED) 5.6 <7.0 %  CBC with Differential/Platelet  Result Value Ref Range   WBC 7.8 3.4 - 10.8 x10E3/uL   RBC 4.76 3.77 - 5.28 x10E6/uL   Hemoglobin 14.8 11.1 - 15.9 g/dL   Hematocrit 44.4 34.0 - 46.6 %   MCV 93 79 - 97 fL   MCH 31.1 26.6 - 33.0 pg   MCHC 33.3 31.5 - 35.7 g/dL   RDW 12.2 11.7 - 15.4 %   Platelets 261 150 - 450 x10E3/uL   Neutrophils 49 Not Estab. %   Lymphs 42 Not Estab. %   Monocytes 7 Not Estab. %   Eos 2 Not Estab. %   Basos 0 Not Estab. %   Neutrophils Absolute 3.8 1.4 - 7.0 x10E3/uL   Lymphocytes Absolute 3.3 (H) 0.7 - 3.1 x10E3/uL   Monocytes Absolute 0.5 0.1 - 0.9 x10E3/uL   EOS (ABSOLUTE) 0.1 0.0 - 0.4 x10E3/uL   Basophils Absolute 0.0 0.0 - 0.2 x10E3/uL   Immature Granulocytes 0 Not Estab. %   Immature Grans (Abs) 0.0 0.0 - 0.1 x10E3/uL  Comprehensive metabolic panel  Result Value Ref Range   Glucose 91 65 - 99 mg/dL   BUN 17 8 - 27 mg/dL   Creatinine, Ser 0.51 (L) 0.57 - 1.00 mg/dL   GFR calc non Af Amer 98 >59 mL/min/1.73   GFR calc Af Amer 113 >59 mL/min/1.73   BUN/Creatinine Ratio 33 (H) 12 - 28   Sodium 141 134 - 144 mmol/L   Potassium 4.0 3.5 - 5.2 mmol/L   Chloride 100 96 - 106 mmol/L   CO2 31 (H) 20 - 29 mmol/L   Calcium 9.9 8.7 - 10.3 mg/dL   Total Protein 7.1 6.0 - 8.5 g/dL   Albumin 4.3 3.8 - 4.8 g/dL   Globulin, Total 2.8 1.5 - 4.5 g/dL   Albumin/Globulin Ratio 1.5 1.2 - 2.2   Bilirubin Total 0.2 0.0 - 1.2 mg/dL   Alkaline Phosphatase 84 44 - 121 IU/L   AST 18 0 - 40 IU/L   ALT 23 0 - 32 IU/L  Lipid Panel w/o Chol/HDL Ratio  Result Value Ref Range   Cholesterol, Total 246 (H) 100 - 199 mg/dL   Triglycerides 213 (H) 0 - 149  mg/dL   HDL 50 >39 mg/dL   VLDL Cholesterol Cal 39 5 - 40 mg/dL   LDL Chol Calc (NIH) 157 (H) 0 - 99 mg/dL  Microalbumin, Urine Waived  Result Value Ref Range  Microalb, Ur Waived 10 0 - 19 mg/L   Creatinine, Urine Waived 50 10 - 300 mg/dL   Microalb/Creat Ratio <30 <30 mg/g  TSH  Result Value Ref Range   TSH 1.790 0.450 - 4.500 uIU/mL  Urinalysis, Routine w reflex microscopic  Result Value Ref Range   Specific Gravity, UA 1.010 1.005 - 1.030   pH, UA 5.0 5.0 - 7.5   Color, UA Yellow Yellow   Appearance Ur Clear Clear   Leukocytes,UA Negative Negative   Protein,UA Negative Negative/Trace   Glucose, UA Negative Negative   Ketones, UA Negative Negative   RBC, UA Negative Negative   Bilirubin, UA Negative Negative   Urobilinogen, Ur 0.2 0.2 - 1.0 mg/dL   Nitrite, UA Negative Negative  VITAMIN D 25 Hydroxy (Vit-D Deficiency, Fractures)  Result Value Ref Range   Vit D, 25-Hydroxy 42.1 30.0 - 100.0 ng/mL      Assessment & Plan:   Problem List Items Addressed This Visit      Cardiovascular and Mediastinum   Hypertension - Primary    Under good control on current regimen. Continue current regimen. Continue to monitor. Call with any concerns. Refills given. Labs to be drawn in the next few days.        Relevant Medications   hydrochlorothiazide (HYDRODIURIL) 25 MG tablet   Other Relevant Orders   Comprehensive metabolic panel     Endocrine   IFG (impaired fasting glucose)    Labs to be drawn in the next few days- await results. Treat as needed.       Relevant Orders   Comprehensive metabolic panel   Bayer DCA Hb A1c Waived     Other   Hyperlipidemia    Labs to be drawn in the next few days- await results. Treat as needed.       Relevant Medications   hydrochlorothiazide (HYDRODIURIL) 25 MG tablet   Other Relevant Orders   Comprehensive metabolic panel   Lipid Panel w/o Chol/HDL Ratio   Vitamin D deficiency    Labs to be drawn in the next few days- await  results. Treat as needed.       Relevant Orders   Comprehensive metabolic panel   VITAMIN D 25 Hydroxy (Vit-D Deficiency, Fractures)    Other Visit Diagnoses    Upper respiratory tract infection, unspecified type       Will check covid screen at home. Cotinue symptomatic treatment. Call with any concerns. Continue to monitor.        Follow up plan: Return in about 6 months (around 08/23/2021) for physical.    . This visit was completed via video visit through MyChart due to the restrictions of the COVID-19 pandemic. All issues as above were discussed and addressed. Physical exam was done as above through visual confirmation on video through MyChart. If it was felt that the patient should be evaluated in the office, they were directed there. The patient verbally consented to this visit. . Location of the patient: home . Location of the provider: work . Those involved with this call:  . Provider: Park Liter, DO . CMA: Louanna Raw, [place] . Front Desk/Registration: Jill Side  . Time spent on call: 25 minutes with patient face to face via video conference. More than 50% of this time was spent in counseling and coordination of care. 40 minutes total spent in review of patient's record and preparation of their chart.

## 2021-02-21 ENCOUNTER — Telehealth: Payer: Self-pay

## 2021-02-21 NOTE — Telephone Encounter (Signed)
FYI Pt stated she did a at home test for covid and it was negative.

## 2021-03-07 ENCOUNTER — Other Ambulatory Visit: Payer: Self-pay

## 2021-03-07 ENCOUNTER — Other Ambulatory Visit: Payer: Medicare Other

## 2021-03-07 DIAGNOSIS — R7301 Impaired fasting glucose: Secondary | ICD-10-CM

## 2021-03-07 DIAGNOSIS — I10 Essential (primary) hypertension: Secondary | ICD-10-CM

## 2021-03-07 DIAGNOSIS — E782 Mixed hyperlipidemia: Secondary | ICD-10-CM | POA: Diagnosis not present

## 2021-03-07 DIAGNOSIS — E559 Vitamin D deficiency, unspecified: Secondary | ICD-10-CM | POA: Diagnosis not present

## 2021-03-07 LAB — BAYER DCA HB A1C WAIVED: HB A1C (BAYER DCA - WAIVED): 5.6 % (ref <7.0)

## 2021-03-08 LAB — COMPREHENSIVE METABOLIC PANEL
ALT: 36 IU/L — ABNORMAL HIGH (ref 0–32)
AST: 23 IU/L (ref 0–40)
Albumin/Globulin Ratio: 1.6 (ref 1.2–2.2)
Albumin: 4.2 g/dL (ref 3.7–4.7)
Alkaline Phosphatase: 66 IU/L (ref 44–121)
BUN/Creatinine Ratio: 37 — ABNORMAL HIGH (ref 12–28)
BUN: 19 mg/dL (ref 8–27)
Bilirubin Total: 0.3 mg/dL (ref 0.0–1.2)
CO2: 27 mmol/L (ref 20–29)
Calcium: 9.8 mg/dL (ref 8.7–10.3)
Chloride: 97 mmol/L (ref 96–106)
Creatinine, Ser: 0.51 mg/dL — ABNORMAL LOW (ref 0.57–1.00)
Globulin, Total: 2.7 g/dL (ref 1.5–4.5)
Glucose: 79 mg/dL (ref 65–99)
Potassium: 4.2 mmol/L (ref 3.5–5.2)
Sodium: 139 mmol/L (ref 134–144)
Total Protein: 6.9 g/dL (ref 6.0–8.5)
eGFR: 100 mL/min/{1.73_m2} (ref >59)

## 2021-03-08 LAB — LIPID PANEL W/O CHOL/HDL RATIO
Cholesterol, Total: 182 mg/dL (ref 100–199)
HDL: 59 mg/dL (ref >39)
LDL Chol Calc (NIH): 103 mg/dL — ABNORMAL HIGH (ref 0–99)
Triglycerides: 115 mg/dL (ref 0–149)
VLDL Cholesterol Cal: 20 mg/dL (ref 5–40)

## 2021-03-08 LAB — VITAMIN D 25 HYDROXY (VIT D DEFICIENCY, FRACTURES): Vit D, 25-Hydroxy: 54.3 ng/mL (ref 30.0–100.0)

## 2021-04-04 ENCOUNTER — Other Ambulatory Visit: Payer: Self-pay

## 2021-04-04 ENCOUNTER — Ambulatory Visit: Payer: Medicare Other | Admitting: Dermatology

## 2021-04-04 DIAGNOSIS — L814 Other melanin hyperpigmentation: Secondary | ICD-10-CM | POA: Diagnosis not present

## 2021-04-04 DIAGNOSIS — L719 Rosacea, unspecified: Secondary | ICD-10-CM

## 2021-04-04 DIAGNOSIS — L578 Other skin changes due to chronic exposure to nonionizing radiation: Secondary | ICD-10-CM

## 2021-04-04 DIAGNOSIS — L821 Other seborrheic keratosis: Secondary | ICD-10-CM | POA: Diagnosis not present

## 2021-04-04 DIAGNOSIS — D229 Melanocytic nevi, unspecified: Secondary | ICD-10-CM

## 2021-04-04 DIAGNOSIS — Z1283 Encounter for screening for malignant neoplasm of skin: Secondary | ICD-10-CM | POA: Diagnosis not present

## 2021-04-04 DIAGNOSIS — Z86018 Personal history of other benign neoplasm: Secondary | ICD-10-CM

## 2021-04-04 DIAGNOSIS — D1800 Hemangioma unspecified site: Secondary | ICD-10-CM

## 2021-04-04 DIAGNOSIS — L853 Xerosis cutis: Secondary | ICD-10-CM

## 2021-04-04 DIAGNOSIS — L219 Seborrheic dermatitis, unspecified: Secondary | ICD-10-CM | POA: Diagnosis not present

## 2021-04-04 DIAGNOSIS — L988 Other specified disorders of the skin and subcutaneous tissue: Secondary | ICD-10-CM | POA: Diagnosis not present

## 2021-04-04 MED ORDER — HYDROCORTISONE 2.5 % EX CREA: TOPICAL_CREAM | Freq: Every day | CUTANEOUS | 11 refills | Status: DC

## 2021-04-04 MED ORDER — KETOCONAZOLE 2 % EX CREA: 1.0000 "application " | TOPICAL_CREAM | Freq: Every day | CUTANEOUS | 11 refills | Status: DC

## 2021-04-04 NOTE — Patient Instructions (Signed)
Instructions for [facility] Medications  One or more of your medications was sent to the [facility] mail order compounding pharmacy. You will receive an email from them and can purchase the medicine through that link. It will then be mailed to your home at the address you confirmed. If for any reason you do not receive an email from them, please check your spam folder. If you still do not find the email, please let us know. [facility] phone number is [phone].   If you have any questions or concerns for your doctor, please call our main line at [phone] and press option 4 to reach your doctor's medical assistant. If no one answers, please leave a voicemail as directed and we will return your call as soon as possible. Messages left after 4 pm will be answered the following business day.   You may also send us a message via MyChart. We typically respond to MyChart messages within 1-2 business days.  For prescription refills, please ask your pharmacy to contact our office. Our fax number is [phone].  If you have an urgent issue when the clinic is closed that cannot wait until the next business day, you can page your doctor at the number below.    Please note that while we do our best to be available for urgent issues outside of office hours, we are not available 24/7.   If you have an urgent issue and are unable to reach us, you may choose to seek medical care at your doctor's office, retail clinic, urgent care center, or emergency room.  If you have a medical emergency, please immediately call 911 or go to the emergency department.  Pager Numbers  - Dr. Kowalski: [phone]  - Dr. Moye: [phone]  - Dr. Stewart: [phone]  In the event of inclement weather, please call our main line at [phone] for an update on the status of any delays or closures.  Dermatology Medication Tips: Please keep the boxes that topical medications come in in order to help  keep track of the instructions about where and how to use these. Pharmacies typically print the medication instructions only on the boxes and not directly on the medication tubes.   If your medication is too expensive, please contact our office at [phone] option 4 or send us a message through MyChart.   We are unable to tell what your co-pay for medications will be in advance as this is different depending on your insurance coverage. However, we may be able to find a substitute medication at lower cost or fill out paperwork to get insurance to cover a needed medication.   If a prior authorization is required to get your medication covered by your insurance company, please allow us 1-2 business days to complete this process.  Drug prices often vary depending on where the prescription is filled and some pharmacies may offer cheaper prices.  The website www.goodrx.com contains coupons for medications through different pharmacies. The prices here do not account for what the cost may be with help from insurance (it may be cheaper with your insurance), but the website can give you the price if you did not use any insurance.  - You can print the associated coupon and take it with your prescription to the pharmacy.  - You may also stop by our office during regular business hours and pick up a GoodRx coupon card.  - If you need your prescription sent electronically to a different pharmacy, notify our office   through [facility] MyChart or by phone at [phone] option 4.  

## 2021-04-04 NOTE — Progress Notes (Deleted)
Follow-Up Visit   Subjective  Mary Galvan is a 70 y.o. female who presents for the following: Annual Exam (History of dysplastic nevus - TBSE today).  The patient presents for Total-Body Skin Exam (TBSE) for skin cancer screening and mole check.   The following portions of the chart were reviewed this encounter and updated as appropriate:        Review of Systems:  No other skin or systemic complaints except as noted in HPI or Assessment and Plan.  Objective  Well appearing patient in no apparent distress; mood and affect are within normal limits.  {[id number]::"L full examination was performed including scalp, head, eyes, ears, nose, lips, neck, chest, axillae, abdomen, back, buttocks, bilateral upper extremities, bilateral lower extremities, hands, feet, fingers, toes, fingernails, and toenails. All findings within normal limits unless otherwise noted below."}  Head - Anterior (Face) Small pink papules  Glabella, forehead Pinkness and scale    Assessment & Plan  Rosacea Head - Anterior (Face)  Rosacea is a chronic progressive skin condition usually affecting the face of adults, causing redness and/or acne bumps. It is treatable but not curable. It sometimes affects the eyes (ocular rosacea) as well. It may respond to topical and/or systemic medication and can flare with stress, sun exposure, alcohol, exercise and some foods.  Daily application of broad spectrum spf 30+ sunscreen to face is recommended to reduce flares.   Will prescribe [facility] metronidazole/ivermectin/azelaic acid twice daily as needed to affected areas on the face. The patient was advised this is not covered by insurance since it is made by a compounding pharmacy. They will receive an email to check out and the medication will be mailed to their home.    Seborrheic dermatitis Glabella, forehead   No follow-ups on file.    Follow-Up Visit   Subjective  Mary Galvan is a 70 y.o.  female who presents for the following: Annual Exam (History of dysplastic nevus - TBSE today).   The following portions of the chart were reviewed this encounter and updated as appropriate:        Review of Systems:  No other skin or systemic complaints except as noted in HPI or Assessment and Plan.  Objective  Well appearing patient in no apparent distress; mood and affect are within normal limits.  {[id number]::"G full examination was performed including scalp, head, eyes, ears, nose, lips, neck, chest, axillae, abdomen, back, buttocks, bilateral upper extremities, bilateral lower extremities, hands, feet, fingers, toes, fingernails, and toenails. All findings within normal limits unless otherwise noted below."}  Head - Anterior (Face) Small pink papules  Glabella, forehead Pinkness and scale    Assessment & Plan  Rosacea Head - Anterior (Face)  Rosacea is a chronic progressive skin condition usually affecting the face of adults, causing redness and/or acne bumps. It is treatable but not curable. It sometimes affects the eyes (ocular rosacea) as well. It may respond to topical and/or systemic medication and can flare with stress, sun exposure, alcohol, exercise and some foods.  Daily application of broad spectrum spf 30+ sunscreen to face is recommended to reduce flares.   Will prescribe [facility] metronidazole/ivermectin/azelaic acid twice daily as needed to affected areas on the face. The patient was advised this is not covered by insurance since it is made by a compounding pharmacy. They will receive an email to check out and the medication will be mailed to their home.    Seborrheic dermatitis Glabella, forehead   No follow-ups on  file.

## 2021-04-04 NOTE — Progress Notes (Signed)
Follow-Up Visit   Subjective  Mary Galvan is a 70 y.o. female who presents for the following: Annual Exam (History of dysplastic nevus - TBSE today). The patient presents for Total-Body Skin Exam (TBSE) for skin cancer screening and mole check.  The following portions of the chart were reviewed this encounter and updated as appropriate:   Tobacco  Allergies  Meds  Problems  Med Hx  Surg Hx  Fam Hx      Review of Systems:  No other skin or systemic complaints except as noted in HPI or Assessment and Plan.  Objective  Well appearing patient in no apparent distress; mood and affect are within normal limits.  A full examination was performed including scalp, head, eyes, ears, nose, lips, neck, chest, axillae, abdomen, back, buttocks, bilateral upper extremities, bilateral lower extremities, hands, feet, fingers, toes, fingernails, and toenails. All findings within normal limits unless otherwise noted below.  Head - Anterior (Face) Small pink papules  Glabella, forehead Pinkness and scale  Head - Anterior (Face) Rhytides and volume loss.    Assessment & Plan   History of Dysplastic Nevi - No evidence of recurrence today - Recommend regular full body skin exams - Recommend daily broad spectrum sunscreen SPF 30+ to sun-exposed areas, reapply every 2 hours as needed.  - Call if any new or changing lesions are noted between office visits  Lentigines - Scattered tan macules - Due to sun exposure - Benign-appering, observe - Recommend daily broad spectrum sunscreen SPF 30+ to sun-exposed areas, reapply every 2 hours as needed. - Call for any changes  Seborrheic Keratoses - Stuck-on, waxy, tan-brown papules and/or plaques  - Benign-appearing - Discussed benign etiology and prognosis. - Observe - Call for any changes  Melanocytic Nevi - Tan-brown and/or pink-flesh-colored symmetric macules and papules - Benign appearing on exam today - Observation - Call  clinic for new or changing moles - Recommend daily use of broad spectrum spf 30+ sunscreen to sun-exposed areas.   Hemangiomas - Red papules - Discussed benign nature - Observe - Call for any changes  Actinic Damage - Chronic condition, secondary to cumulative UV/sun exposure - diffuse scaly erythematous macules with underlying dyspigmentation - Recommend daily broad spectrum sunscreen SPF 30+ to sun-exposed areas, reapply every 2 hours as needed.  - Staying in the shade or wearing long sleeves, sun glasses (UVA+UVB protection) and wide brim hats (4-inch brim around the entire circumference of the hat) are also recommended for sun protection.  - Call for new or changing lesions.  Skin cancer screening performed today.  Rosacea Head - Anterior (Face)  Rosacea is a chronic progressive skin condition usually affecting the face of adults, causing redness and/or acne bumps. It is treatable but not curable. It sometimes affects the eyes (ocular rosacea) as well. It may respond to topical and/or systemic medication and can flare with stress, sun exposure, alcohol, exercise and some foods.  Daily application of broad spectrum spf 30+ sunscreen to face is recommended to reduce flares.   Will prescribe [facility] metronidazole/ivermectin/azelaic acid twice daily as needed to affected areas on the face. The patient was advised this is not covered by insurance since it is made by a compounding pharmacy. They will receive an email to check out and the medication will be mailed to their home.    Seborrheic dermatitis Glabella, forehead ketoconazole (NIZORAL) 2 % cream - Glabella, forehead Apply 1 application topically daily. Monday, Wednesday, Friday hydrocortisone 2.5 % cream - Glabella, forehead  Apply topically daily. Tuesday, Thursday, Saturday Seborrheic Dermatitis  -  is a chronic persistent rash characterized by pinkness and scaling most commonly of the mid face but also can occur on  the scalp (dandruff), ears; mid chest and mid back. It tends to be exacerbated by stress and cooler weather.  People who have neurologic disease may experience new onset or exacerbation of existing seborrheic dermatitis.  The condition is not curable but treatable and can be controlled.  Elastosis of skin Head - Anterior (Face) Discussed tretinoin - may consider in the fall.  Xerosis cutis Recommend Amlactin Rapid Relief  Skin cancer screening  Return in about 1 year (around 04/04/2022).  I, Ashok Cordia, CMA, am acting as scribe for Sarina Ser, MD .  Documentation: I have reviewed the above documentation for accuracy and completeness, and I agree with the above.  Sarina Ser, MD

## 2021-04-10 ENCOUNTER — Encounter: Payer: Self-pay | Admitting: Dermatology

## 2021-06-24 ENCOUNTER — Ambulatory Visit: Payer: Self-pay | Admitting: *Deleted

## 2021-06-24 ENCOUNTER — Telehealth (INDEPENDENT_AMBULATORY_CARE_PROVIDER_SITE_OTHER): Payer: Medicare Other | Admitting: Internal Medicine

## 2021-06-24 ENCOUNTER — Other Ambulatory Visit: Payer: Self-pay

## 2021-06-24 DIAGNOSIS — U071 COVID-19: Secondary | ICD-10-CM

## 2021-06-24 MED ORDER — AZITHROMYCIN 250 MG PO TABS: ORAL_TABLET | ORAL | 0 refills | Status: AC

## 2021-06-24 MED ORDER — FEXOFENADINE HCL 180 MG PO TABS: 180.0000 mg | ORAL_TABLET | Freq: Every day | ORAL | 1 refills | Status: DC

## 2021-06-24 NOTE — Progress Notes (Signed)
There were no vitals taken for this visit.   Subjective:    Patient ID: Mary Galvan, female    DOB: 1951/02/13, 70 y.o.   MRN: [id number]  No chief complaint on file.   HPI: Mary Galvan is a 70 y.o. female   This visit was completed via telephone due to the restrictions of the COVID-19 pandemic. All issues as above were discussed and addressed but no physical exam was performed. If it was felt that the patient should be evaluated in the office, they were directed there. The patient verbally consented to this visit. Patient was unable to complete an audio/visual visit due to Technical difficulties. Due to the catastrophic nature of the COVID-19 pandemic, this visit was done through audio contact only. Location of the patient: home Location of the provider: work Those involved with this call:  Provider: Charlynne Cousins, MD CMA: Frazier Butt, [place] Desk/Registration: Roe Rutherford  Time spent on call: 10 minutes on the phone discussing health concerns. 10 minutes total spent in review of patient's record and preparation of their chart.  Fatigued x 2 days before Friday , runny nose and congestion with sore throat, on Saturday - checked temp 100.3 F, Sunday developed a cough  Has been taking ibubrufen for irritated throat and cough. Took a home COVID test on Friday -ve and tested positive today sec to not feeling better.feeling a lot better today.  Trying to stay hydrated per pt. Moving around the house.   Cough This is a new problem. The current episode started in the past 7 days.   No chief complaint on file.   Relevant past medical, surgical, family and social history reviewed and updated as indicated. Interim medical history since our last visit reviewed. Allergies and medications reviewed and updated.  Review of Systems  Respiratory:  Positive for cough.    Per HPI unless specifically indicated above     Objective:    There were no vitals taken for this  visit.  Wt Readings from Last 3 Encounters:  02/20/21 170 lb (77.1 kg)  08/13/20 170 lb 3.2 oz (77.2 kg)  08/13/20 169 lb (76.7 kg)    Physical Exam  Unable to peform sec to virtual visit.   Results for orders placed or performed in visit on 03/07/21  VITAMIN D 25 Hydroxy (Vit-D Deficiency, Fractures)  Result Value Ref Range   Vit D, 25-Hydroxy 54.3 30.0 - 100.0 ng/mL  Lipid Panel w/o Chol/HDL Ratio  Result Value Ref Range   Cholesterol, Total 182 100 - 199 mg/dL   Triglycerides 115 0 - 149 mg/dL   HDL 59 >39 mg/dL   VLDL Cholesterol Cal 20 5 - 40 mg/dL   LDL Chol Calc (NIH) 103 (H) 0 - 99 mg/dL  Bayer DCA Hb A1c Waived  Result Value Ref Range   HB A1C (BAYER DCA - WAIVED) 5.6 <7.0 %  Comprehensive metabolic panel  Result Value Ref Range   Glucose 79 65 - 99 mg/dL   BUN 19 8 - 27 mg/dL   Creatinine, Ser 0.51 (L) 0.57 - 1.00 mg/dL   eGFR 100 >59 mL/min/1.73   BUN/Creatinine Ratio 37 (H) 12 - 28   Sodium 139 134 - 144 mmol/L   Potassium 4.2 3.5 - 5.2 mmol/L   Chloride 97 96 - 106 mmol/L   CO2 27 20 - 29 mmol/L   Calcium 9.8 8.7 - 10.3 mg/dL   Total Protein 6.9 6.0 - 8.5 g/dL   Albumin 4.2  3.7 - 4.7 g/dL   Globulin, Total 2.7 1.5 - 4.5 g/dL   Albumin/Globulin Ratio 1.6 1.2 - 2.2   Bilirubin Total 0.3 0.0 - 1.2 mg/dL   Alkaline Phosphatase 66 44 - 121 IU/L   AST 23 0 - 40 IU/L   ALT 36 (H) 0 - 32 IU/L        Current Outpatient Medications:    Cholecalciferol (VITAMIN D3) 2000 UNITS TABS, Take 2,000 Units by mouth daily. , Disp: , Rfl:    hydrochlorothiazide (HYDRODIURIL) 25 MG tablet, Take 1 tablet (25 mg total) by mouth daily., Disp: 90 tablet, Rfl: 1   hydrocortisone 2.5 % cream, Apply topically daily. Tuesday, Thursday, Saturday, Disp: 30 g, Rfl: 11   ketoconazole (NIZORAL) 2 % cream, Apply 1 application topically daily. Monday, Wednesday, Friday, Disp: 60 g, Rfl: 11   Multiple Vitamins-Minerals (PRESERVISION AREDS 2 PO), Take 1 tablet by mouth 2 (two) times  daily., Disp: , Rfl:    Turmeric 500 MG TABS, turmeric (bulk), Disp: , Rfl:     Assessment & Plan:  COVID : positive : Increase fluid intake.  Headahce - tyelnol every 4-6 hrs prn and alternate this with ibubrufen 800 mg q 8 hrly. Sinus pressure: use steam inhalation.  OTC -  Allegra / claritin. 5 days quarantine.  Ok to rtw in 5 days if tests -ve follow  Pt verbalized understanding of such, to get to the office at today and get a curb side test for the above.  Problem List Items Addressed This Visit   None    No orders of the defined types were placed in this encounter.    Meds ordered this encounter  Medications   fexofenadine (ALLEGRA ALLERGY) 180 MG tablet    Sig: Take 1 tablet (180 mg total) by mouth daily.    Dispense:  10 tablet    Refill:  1   azithromycin (ZITHROMAX) 250 MG tablet    Sig: Take 2 tablets on day 1, then 1 tablet daily on days 2 through 5    Dispense:  6 tablet    Refill:  0     Follow up plan: No follow-ups on file.

## 2021-06-24 NOTE — Telephone Encounter (Signed)
Patient is calling to report she has tested + COVID- patient advised per COVID protocol treatment/symptoms- she has questions about treatment with antiviral- appointment scheduled.  Reason for Disposition  [1] HIGH RISK for severe COVID complications (e.g., weak immune system, age > 105 years, obesity with BMI > 25, pregnant, chronic lung disease or other chronic medical condition) AND [2] COVID symptoms (e.g., cough, fever)  (Exceptions: Already seen by PCP and no new or worsening symptoms.)  Answer Assessment - Initial Assessment Questions 1. COVID-19 DIAGNOSIS: "Who made your COVID-19 diagnosis?" "Was it confirmed by a positive lab test or self-test?" If not diagnosed by a doctor (or NP/PA), ask "Are there lots of cases (community spread) where you live?" Note: See public health department website, if unsure.     + home test 2. COVID-19 EXPOSURE: "Was there any known exposure to COVID before the symptoms began?" CDC Definition of close contact: within 6 feet (2 meters) for a total of 15 minutes or more over a 24-hour period.      unknown 3. ONSET: "When did the COVID-19 symptoms start?"      Symptoms started- Thursday- fatigue, Friday- raspy voice-sore throat, Saturday- cough, fever- negative test 4. WORST SYMPTOM: "What is your worst symptom?" (e.g., cough, fever, shortness of breath, muscle aches)     cough 5. COUGH: "Do you have a cough?" If Yes, ask: "How bad is the cough?"       Yes- constant cough when talks 6. FEVER: "Do you have a fever?" If Yes, ask: "What is your temperature, how was it measured, and when did it start?"     Yes- 99.3 oral thermometer- Started Saturday 7. RESPIRATORY STATUS: "Describe your breathing?" (e.g., shortness of breath, wheezing, unable to speak)      Breathing is ok- not noticed SOB 8. BETTER-SAME-WORSE: "Are you getting better, staying the same or getting worse compared to yesterday?"  If getting worse, ask, "In what way?"     Better- overall- took a shower  today 9. HIGH RISK DISEASE: "Do you have any chronic medical problems?" (e.g., asthma, heart or lung disease, weak immune system, obesity, etc.)     Age, hypertension hx 10. VACCINE: "Have you had the COVID-19 vaccine?" If Yes, ask: "Which one, how many shots, when did you get it?"       Yes- Pfizer  11. BOOSTER: "Have you received your COVID-19 booster?" If Yes, ask: "Which one and when did you get it?"       Yes- 1 booster 07/2020 12. PREGNANCY: "Is there any chance you are pregnant?" "When was your last menstrual period?"       N/a 13. OTHER SYMPTOMS: "Do you have any other symptoms?"  (e.g., chills, fatigue, headache, loss of smell or taste, muscle pain, sore throat)       Fatigue, headache, back pain 14. O2 SATURATION MONITOR:  "Do you use an oxygen saturation monitor (pulse oximeter) at home?" If Yes, ask "What is your reading (oxygen level) today?" "What is your usual oxygen saturation reading?" (e.g., 95%)       no  Protocols used: Coronavirus (COVID-19) Diagnosed or Suspected-A-AH

## 2021-06-24 NOTE — Telephone Encounter (Signed)
Message from Rayann Heman sent at 06/24/2021 11:14 AM EDT  Summary: Positive covid   Pt called and stated that she tested positive for covid with a at home test. Pt would like some advise on what she should do. Pt states that she has a low grade fever/runny nose/ sore throat/body pain/tired/headache/low back pain.           Call History   Type Contact Phone/Fax User  06/24/2021 11:12 AM EDT Phone (Incoming) Trese, Jaegers (Self) [phone] Lemmie Evens) Terressa Koyanagi, Fraser Din

## 2021-06-25 ENCOUNTER — Encounter: Payer: Self-pay | Admitting: Internal Medicine

## 2021-06-25 DIAGNOSIS — U071 COVID-19: Secondary | ICD-10-CM | POA: Insufficient documentation

## 2021-08-20 ENCOUNTER — Encounter: Payer: Medicare Other | Admitting: Family Medicine

## 2021-09-10 ENCOUNTER — Encounter: Payer: Self-pay | Admitting: Family Medicine

## 2021-09-10 ENCOUNTER — Other Ambulatory Visit: Payer: Self-pay

## 2021-09-10 ENCOUNTER — Ambulatory Visit (INDEPENDENT_AMBULATORY_CARE_PROVIDER_SITE_OTHER): Payer: Medicare Other | Admitting: Family Medicine

## 2021-09-10 VITALS — BP 122/79 | HR 65 | Temp 98.4°F | Ht 64.2 in | Wt 141.8 lb

## 2021-09-10 DIAGNOSIS — Z1211 Encounter for screening for malignant neoplasm of colon: Secondary | ICD-10-CM

## 2021-09-10 DIAGNOSIS — I700 Atherosclerosis of aorta: Secondary | ICD-10-CM | POA: Diagnosis not present

## 2021-09-10 DIAGNOSIS — Z13820 Encounter for screening for osteoporosis: Secondary | ICD-10-CM

## 2021-09-10 DIAGNOSIS — Z0000 Encounter for general adult medical examination without abnormal findings: Secondary | ICD-10-CM

## 2021-09-10 DIAGNOSIS — Z23 Encounter for immunization: Secondary | ICD-10-CM | POA: Diagnosis not present

## 2021-09-10 DIAGNOSIS — I10 Essential (primary) hypertension: Secondary | ICD-10-CM | POA: Diagnosis not present

## 2021-09-10 DIAGNOSIS — E559 Vitamin D deficiency, unspecified: Secondary | ICD-10-CM

## 2021-09-10 DIAGNOSIS — F3342 Major depressive disorder, recurrent, in full remission: Secondary | ICD-10-CM

## 2021-09-10 DIAGNOSIS — E782 Mixed hyperlipidemia: Secondary | ICD-10-CM

## 2021-09-10 DIAGNOSIS — R7301 Impaired fasting glucose: Secondary | ICD-10-CM | POA: Diagnosis not present

## 2021-09-10 DIAGNOSIS — K760 Fatty (change of) liver, not elsewhere classified: Secondary | ICD-10-CM | POA: Diagnosis not present

## 2021-09-10 DIAGNOSIS — Z1231 Encounter for screening mammogram for malignant neoplasm of breast: Secondary | ICD-10-CM

## 2021-09-10 LAB — URINALYSIS, ROUTINE W REFLEX MICROSCOPIC
Bilirubin, UA: NEGATIVE
Glucose, UA: NEGATIVE
Ketones, UA: NEGATIVE
Leukocytes,UA: NEGATIVE
Nitrite, UA: NEGATIVE
Protein,UA: NEGATIVE
RBC, UA: NEGATIVE
Specific Gravity, UA: 1.02 (ref 1.005–1.030)
Urobilinogen, Ur: 0.2 mg/dL (ref 0.2–1.0)
pH, UA: 7 (ref 5.0–7.5)

## 2021-09-10 LAB — MICROALBUMIN, URINE WAIVED
Creatinine, Urine Waived: 50 mg/dL (ref 10–300)
Microalb, Ur Waived: 10 mg/L (ref 0–19)
Microalb/Creat Ratio: <30 mg/g (ref <30)

## 2021-09-10 LAB — BAYER DCA HB A1C WAIVED: HB A1C (BAYER DCA - WAIVED): 5.4 % (ref 4.8–5.6)

## 2021-09-10 NOTE — Assessment & Plan Note (Signed)
Doing great with A1c of 5.4 after 60lb weight loss. Will resolve off problem list.

## 2021-09-10 NOTE — Assessment & Plan Note (Signed)
Will keep BP and cholesterol under good control. Continue to monitor. Call with any concerns. Continue to monitor.

## 2021-09-10 NOTE — Assessment & Plan Note (Signed)
Rechecking labs today. Await results. Congratulated patient on 60lb weight loss. Continue to monitor.

## 2021-09-10 NOTE — Assessment & Plan Note (Signed)
BP doing great off medicine with 60lb weight loss. Will stop HCTZ and recheck 3 months. Call with any concerns. Continue to monitor.

## 2021-09-10 NOTE — Assessment & Plan Note (Signed)
Doing well off medicine. Continue to monitor. Call with any concerns.  

## 2021-09-10 NOTE — Patient Instructions (Signed)
Please call to schedule your mammogram and bone density: °[facility]  °Address: [address], [place], [place] [address]  °Phone: [phone] ° °

## 2021-09-10 NOTE — Progress Notes (Signed)
BP 122/79   Pulse 65   Temp 98.4 F (36.9 C) (Oral)   Ht 5' 4.2" (1.631 m)   Wt 141 lb 12.8 oz (64.3 kg)   SpO2 98%   BMI 24.19 kg/m    Subjective:    Patient ID: Mary Galvan, female    DOB: 1951-01-26, 70 y.o.   MRN: [id number]  HPI: Mary Galvan is a 70 y.o. female presenting on 09/10/2021 for comprehensive medical examination. Current medical complaints include:  HYPERTENSION / HYPERLIPIDEMIA Satisfied with current treatment? yes Duration of hypertension: chronic BP monitoring frequency: a few times a week BP range: 110s/60s 0n 1/2 tab BP medication side effects: no Past BP meds: HCTZ Duration of hyperlipidemia: chronic Cholesterol medication side effects: not on anything Cholesterol supplements: none Past cholesterol medications: none Medication compliance: excellent compliance Aspirin: no Recent stressors: no Recurrent headaches: no Visual changes: no Palpitations: no Dyspnea: no Chest pain: no Lower extremity edema: no Dizzy/lightheaded: no  Impaired Fasting Glucose HbA1C:  Lab Results  Component Value Date   HGBA1C 5.4 09/10/2021   Duration of elevated blood sugar: chronic Polydipsia: no Polyuria: no Weight change: yes Visual disturbance: no Glucose Monitoring: no Diabetic Education: Not Completed  DEPRESSION Mood status: controlled Satisfied with current treatment?: yes Symptom severity: mild  Duration of current treatment : not on anything Psychotherapy/counseling: no  Depressed mood: no Anxious mood: no Anhedonia: no Significant weight loss or gain: yes Insomnia: no  Fatigue: no Feelings of worthlessness or guilt: no Impaired concentration/indecisiveness: no Suicidal ideations: no Hopelessness: no Crying spells: no Depression screen [facility] 2/9 09/10/2021 02/20/2021 08/13/2020 01/23/2020 07/25/2019  Decreased Interest 0 0 0 0 0  Down, Depressed, Hopeless 0 - 0 0 0  PHQ - 2 Score 0 0 0 0 0  Altered sleeping 0 - 0 0 0  Tired,  decreased energy 0 - 0 0 0  Change in appetite 0 - 0 0 0  Feeling bad or failure about yourself  0 - 0 0 0  Trouble concentrating 0 - 0 0 0  Moving slowly or fidgety/restless 0 - 0 0 0  Suicidal thoughts 0 - 0 0 0  PHQ-9 Score 0 - 0 0 0  Difficult doing work/chores Not difficult at all - Not difficult at all Not difficult at all Not difficult at all   Menopausal Symptoms: no  Depression Screen done today and results listed below:  Depression screen [facility] 2/9 09/10/2021 02/20/2021 08/13/2020 01/23/2020 07/25/2019  Decreased Interest 0 0 0 0 0  Down, Depressed, Hopeless 0 - 0 0 0  PHQ - 2 Score 0 0 0 0 0  Altered sleeping 0 - 0 0 0  Tired, decreased energy 0 - 0 0 0  Change in appetite 0 - 0 0 0  Feeling bad or failure about yourself  0 - 0 0 0  Trouble concentrating 0 - 0 0 0  Moving slowly or fidgety/restless 0 - 0 0 0  Suicidal thoughts 0 - 0 0 0  PHQ-9 Score 0 - 0 0 0  Difficult doing work/chores Not difficult at all - Not difficult at all Not difficult at all Not difficult at all    Past Medical History:  Past Medical History:  Diagnosis Date   Actinic keratosis    Anxiety    Arthritis    thumbs   Basal cell carcinoma 10/20/2017   just under left brow   Depression    Dysplastic nevus 01/19/2018   right distal  dorsum foot   Fatty liver disease, nonalcoholic    GERD (gastroesophageal reflux disease)    Heart murmur    Hyperlipidemia    Hypertension    Kidney cysts    Motion sickness    reading in cars   Ocular migraine    PONV (postoperative nausea and vomiting)    slow to wake   Rosacea    Seasonal affective disorder (HCC)    Seasonal allergies    Seizures (HCC)    x1 - after head trauma. 1970's   Vitreous detachment of left eye July 2012   [facility]    Surgical History:  Past Surgical History:  Procedure Laterality Date   BASAL CELL CARCINOMA EXCISION Left    removed from above left eye    CHOLECYSTECTOMY  2015   COLONOSCOPY WITH PROPOFOL N/A  05/10/2015   Procedure: COLONOSCOPY WITH PROPOFOL;  Surgeon: Lucilla Lame, MD;  Location: [place];  Service: Endoscopy;  Laterality: N/A;  WITH BIOPSY-- SIGMOID COLON POLYP  X  4 DESCENDING COLON POLYP   DILATATION & CURETTAGE/HYSTEROSCOPY WITH MYOSURE N/A 03/28/2016   Procedure: DILATATION & CURETTAGE/HYSTEROSCOPY WITH MYOSURE;  Surgeon: Boykin Nearing, MD;  Location: [facility];  Service: Gynecology;  Laterality: N/A;   ESOPHAGOGASTRODUODENOSCOPY (EGD) WITH PROPOFOL N/A 09/02/2017   Procedure: ESOPHAGOGASTRODUODENOSCOPY (EGD) WITH PROPOFOL;  Surgeon: Lin Landsman, MD;  Location: [place];  Service: Endoscopy;  Laterality: N/A;   MELANOMA EXCISION     pre melenoma excision on right foot    OTHER SURGICAL HISTORY     Uterine Polyp    Medications:  Current Outpatient Medications on File Prior to Visit  Medication Sig   Cholecalciferol (VITAMIN D3) 2000 UNITS TABS Take 2,000 Units by mouth daily.    Multiple Vitamins-Minerals (PRESERVISION AREDS 2 PO) Take 1 tablet by mouth 2 (two) times daily.   Turmeric 500 MG TABS turmeric (bulk)   No current facility-administered medications on file prior to visit.    Allergies:  No Known Allergies  Social History:  Social History   Socioeconomic History   Marital status: Married    Spouse name: Not on file   Number of children: Not on file   Years of education: Not on file   Highest education level: High school graduate  Occupational History   Occupation: retired  Tobacco Use   Smoking status: Former    Packs/day: 1.00    Years: 30.00    Pack years: 30.00    Types: Cigarettes    Quit date: 03/13/2000    Years since quitting: 21.5   Smokeless tobacco: Never  Vaping Use   Vaping Use: Never used  Substance and Sexual Activity   Alcohol use: Yes    Comment: occasional   Drug use: No   Sexual activity: Not Currently    Partners: Male  Other Topics Concern   Not on file  Social History Narrative    Not on file   Social Determinants of Health   Financial Resource Strain: Not on file  Food Insecurity: Not on file  Transportation Needs: Not on file  Physical Activity: Not on file  Stress: Not on file  Social Connections: Not on file  Intimate Partner Violence: Not on file   Social History   Tobacco Use  Smoking Status Former   Packs/day: 1.00   Years: 30.00   Pack years: 30.00   Types: Cigarettes   Quit date: 03/13/2000   Years since quitting: 21.5  Smokeless  Tobacco Never   Social History   Substance and Sexual Activity  Alcohol Use Yes   Comment: occasional    Family History:  Family History  Problem Relation Age of Onset   Cancer Mother        possible lung?   Dementia Mother    Cancer Sister        kidney   Diabetes Sister    COPD Sister    Cancer Paternal Grandfather        stomach and prostate   Diabetes Sister    Heart disease Sister    Hypertension Sister    COPD Sister    Stroke Maternal Uncle    Breast cancer Neg Hx     Past medical history, surgical history, medications, allergies, family history and social history reviewed with patient today and changes made to appropriate areas of the chart.   Review of Systems  Constitutional: Negative.   HENT: Negative.    Eyes:  Positive for blurred vision and double vision. Negative for photophobia, pain, discharge and redness.  Respiratory: Negative.    Cardiovascular: Negative.   Gastrointestinal: Negative.   Genitourinary:  Positive for urgency. Negative for dysuria, flank pain, frequency and hematuria.  Musculoskeletal: Negative.   Skin: Negative.   Neurological: Negative.   Endo/Heme/Allergies: Negative.   Psychiatric/Behavioral: Negative.    All other ROS negative except what is listed above and in the HPI.      Objective:    BP 122/79   Pulse 65   Temp 98.4 F (36.9 C) (Oral)   Ht 5' 4.2" (1.631 m)   Wt 141 lb 12.8 oz (64.3 kg)   SpO2 98%   BMI 24.19 kg/m   Wt Readings from Last  3 Encounters:  09/10/21 141 lb 12.8 oz (64.3 kg)  02/20/21 170 lb (77.1 kg)  08/13/20 170 lb 3.2 oz (77.2 kg)    Physical Exam Vitals and nursing note reviewed.  Constitutional:      General: She is not in acute distress.    Appearance: Normal appearance. She is not ill-appearing, toxic-appearing or diaphoretic.  HENT:     Head: Normocephalic and atraumatic.     Right Ear: Tympanic membrane, ear canal and external ear normal. There is no impacted cerumen.     Left Ear: Tympanic membrane, ear canal and external ear normal. There is no impacted cerumen.     Nose: Nose normal. No congestion or rhinorrhea.     Mouth/Throat:     Mouth: Mucous membranes are moist.     Pharynx: Oropharynx is clear. No oropharyngeal exudate or posterior oropharyngeal erythema.  Eyes:     General: No scleral icterus.       Right eye: No discharge.        Left eye: No discharge.     Extraocular Movements: Extraocular movements intact.     Conjunctiva/sclera: Conjunctivae normal.     Pupils: Pupils are equal, round, and reactive to light.  Neck:     Vascular: No carotid bruit.  Cardiovascular:     Rate and Rhythm: Normal rate and regular rhythm.     Pulses: Normal pulses.     Heart sounds: No murmur heard.   No friction rub. No gallop.  Pulmonary:     Effort: Pulmonary effort is normal. No respiratory distress.     Breath sounds: Normal breath sounds. No stridor. No wheezing, rhonchi or rales.  Chest:     Chest wall: No tenderness.  Abdominal:  General: Abdomen is flat. Bowel sounds are normal. There is no distension.     Palpations: Abdomen is soft. There is no mass.     Tenderness: There is no abdominal tenderness. There is no right CVA tenderness, left CVA tenderness, guarding or rebound.     Hernia: No hernia is present.  Genitourinary:    Comments: Breast and pelvic exams deferred with shared decision making Musculoskeletal:        General: No swelling, tenderness, deformity or signs of  injury.     Cervical back: Normal range of motion and neck supple. No rigidity. No muscular tenderness.     Right lower leg: No edema.     Left lower leg: No edema.  Lymphadenopathy:     Cervical: No cervical adenopathy.  Skin:    General: Skin is warm and dry.     Capillary Refill: Capillary refill takes less than 2 seconds.     Coloration: Skin is not jaundiced or pale.     Findings: No bruising, erythema, lesion or rash.  Neurological:     General: No focal deficit present.     Mental Status: She is alert and oriented to person, place, and time. Mental status is at baseline.     Cranial Nerves: No cranial nerve deficit.     Sensory: No sensory deficit.     Motor: No weakness.     Coordination: Coordination normal.     Gait: Gait normal.     Deep Tendon Reflexes: Reflexes normal.  Psychiatric:        Mood and Affect: Mood normal.        Behavior: Behavior normal.        Thought Content: Thought content normal.        Judgment: Judgment normal.    Results for orders placed or performed in visit on 09/10/21  Urinalysis, Routine w reflex microscopic  Result Value Ref Range   Specific Gravity, UA 1.020 1.005 - 1.030   pH, UA 7.0 5.0 - 7.5   Color, UA Yellow Yellow   Appearance Ur Clear Clear   Leukocytes,UA Negative Negative   Protein,UA Negative Negative/Trace   Glucose, UA Negative Negative   Ketones, UA Negative Negative   RBC, UA Negative Negative   Bilirubin, UA Negative Negative   Urobilinogen, Ur 0.2 0.2 - 1.0 mg/dL   Nitrite, UA Negative Negative  Microalbumin, Urine Waived  Result Value Ref Range   Microalb, Ur Waived 10 0 - 19 mg/L   Creatinine, Urine Waived 50 10 - 300 mg/dL   Microalb/Creat Ratio <30 <30 mg/g  Bayer DCA Hb A1c Waived  Result Value Ref Range   HB A1C (BAYER DCA - WAIVED) 5.4 4.8 - 5.6 %      Assessment & Plan:   Problem List Items Addressed This Visit       Cardiovascular and Mediastinum   Hypertension    BP doing great off medicine  with 60lb weight loss. Will stop HCTZ and recheck 3 months. Call with any concerns. Continue to monitor.       Relevant Orders   CBC with Differential/Platelet   Comprehensive metabolic panel   Urinalysis, Routine w reflex microscopic (Completed)   TSH   Microalbumin, Urine Waived (Completed)   Abdominal aortic atherosclerosis (HCC)    Will keep BP and cholesterol under good control. Continue to monitor. Call with any concerns. Continue to monitor.       Relevant Orders   CBC with Differential/Platelet  Comprehensive metabolic panel     Digestive   Fatty liver disease, nonalcoholic    Rechecking labs today. Await results. Congratulated patient on 60lb weight loss. Continue to monitor.       Relevant Orders   CBC with Differential/Platelet   Comprehensive metabolic panel     Endocrine   RESOLVED: IFG (impaired fasting glucose)    Doing great with A1c of 5.4 after 60lb weight loss. Will resolve off problem list.       Relevant Orders   CBC with Differential/Platelet   Comprehensive metabolic panel   Microalbumin, Urine Waived (Completed)   Bayer DCA Hb A1c Waived (Completed)     Other   Hyperlipidemia    Rechecking labs today. Await results. Treat as needed.       Relevant Orders   CBC with Differential/Platelet   Comprehensive metabolic panel   Lipid Panel w/o Chol/HDL Ratio   Depression    Doing well off medicine. Continue to monitor. Call with any concerns.       Relevant Orders   CBC with Differential/Platelet   Comprehensive metabolic panel   Vitamin D deficiency    Rechecking labs today. Await results. Treat as needed.       Relevant Orders   CBC with Differential/Platelet   Comprehensive metabolic panel   Other Visit Diagnoses     Routine general medical examination at a health care facility    -  Primary   Vaccines up to date. Screening labs checked today. Mammogram, DEXA and colonoscopy ordered today. Continue diet and exercise. Call with any  concerns.    Encounter for screening mammogram for malignant neoplasm of breast       Mammogram ordered today.   Relevant Orders   MM 3D SCREEN BREAST BILATERAL   Need for influenza vaccination       Flu shot given today.    Relevant Orders   Flu Vaccine QUAD High Dose(Fluad) (Completed)   Screening for colon cancer       Referral to GI placed today.    Relevant Orders   Ambulatory referral to Gastroenterology   Screening for osteoporosis       DEXA ordered today.    Relevant Orders   DG Bone Density        Follow up plan: Return in about 3 months (around 12/10/2021).   LABORATORY TESTING:  - Pap smear: not applicable  IMMUNIZATIONS:   - Tdap: Tetanus vaccination status reviewed: last tetanus booster within 10 years. - Influenza: Administered today - Pneumovax: Up to date - Prevnar: Up to date - COVID: Up to date - HPV: Not applicable - Shingrix vaccine: Up to date  SCREENING: -Mammogram: Ordered today  - Colonoscopy: Ordered today  - Bone Density: Ordered today   PATIENT COUNSELING:   Advised to take 1 mg of folate supplement per day if capable of pregnancy.   Sexuality: Discussed sexually transmitted diseases, partner selection, use of condoms, avoidance of unintended pregnancy  and contraceptive alternatives.   Advised to avoid cigarette smoking.  I discussed with the patient that most people either abstain from alcohol or drink within safe limits (<=14/week and <=4 drinks/occasion for males, <=7/weeks and <= 3 drinks/occasion for females) and that the risk for alcohol disorders and other health effects rises proportionally with the number of drinks per week and how often a drinker exceeds daily limits.  Discussed cessation/primary prevention of drug use and availability of treatment for abuse.   Diet: Encouraged to adjust caloric  intake to maintain  or achieve ideal body weight, to reduce intake of dietary saturated fat and total fat, to limit sodium intake by  avoiding high sodium foods and not adding table salt, and to maintain adequate dietary potassium and calcium preferably from fresh fruits, vegetables, and low-fat dairy products.    stressed the importance of regular exercise  Injury prevention: Discussed safety belts, safety helmets, smoke detector, smoking near bedding or upholstery.   Dental health: Discussed importance of regular tooth brushing, flossing, and dental visits.    NEXT PREVENTATIVE PHYSICAL DUE IN 1 YEAR. Return in about 3 months (around 12/10/2021).

## 2021-09-10 NOTE — Assessment & Plan Note (Signed)
Rechecking labs today. Await results. Treat as needed.  °

## 2021-09-11 LAB — COMPREHENSIVE METABOLIC PANEL
ALT: 23 IU/L (ref 0–32)
AST: 23 IU/L (ref 0–40)
Albumin/Globulin Ratio: 1.7 (ref 1.2–2.2)
Albumin: 4.3 g/dL (ref 3.7–4.7)
Alkaline Phosphatase: 82 IU/L (ref 44–121)
BUN/Creatinine Ratio: 25 (ref 12–28)
BUN: 17 mg/dL (ref 8–27)
Bilirubin Total: 0.4 mg/dL (ref 0.0–1.2)
CO2: 26 mmol/L (ref 20–29)
Calcium: 9.5 mg/dL (ref 8.7–10.3)
Chloride: 101 mmol/L (ref 96–106)
Creatinine, Ser: 0.68 mg/dL (ref 0.57–1.00)
Globulin, Total: 2.5 g/dL (ref 1.5–4.5)
Glucose: 87 mg/dL (ref 70–99)
Potassium: 4.1 mmol/L (ref 3.5–5.2)
Sodium: 140 mmol/L (ref 134–144)
Total Protein: 6.8 g/dL (ref 6.0–8.5)
eGFR: 93 mL/min/{1.73_m2} (ref >59)

## 2021-09-11 LAB — CBC WITH DIFFERENTIAL/PLATELET
Basophils Absolute: 0 10*3/uL (ref 0.0–0.2)
Basos: 1 %
EOS (ABSOLUTE): 0.1 10*3/uL (ref 0.0–0.4)
Eos: 2 %
Hematocrit: 43.7 % (ref 34.0–46.6)
Hemoglobin: 15 g/dL (ref 11.1–15.9)
Immature Grans (Abs): 0 10*3/uL (ref 0.0–0.1)
Immature Granulocytes: 0 %
Lymphocytes Absolute: 2.5 10*3/uL (ref 0.7–3.1)
Lymphs: 42 %
MCH: 31.8 pg (ref 26.6–33.0)
MCHC: 34.3 g/dL (ref 31.5–35.7)
MCV: 93 fL (ref 79–97)
Monocytes Absolute: 0.5 10*3/uL (ref 0.1–0.9)
Monocytes: 8 %
Neutrophils Absolute: 2.9 10*3/uL (ref 1.4–7.0)
Neutrophils: 47 %
Platelets: 223 10*3/uL (ref 150–450)
RBC: 4.72 x10E6/uL (ref 3.77–5.28)
RDW: 12.2 % (ref 11.7–15.4)
WBC: 6 10*3/uL (ref 3.4–10.8)

## 2021-09-11 LAB — TSH: TSH: 1.44 u[IU]/mL (ref 0.450–4.500)

## 2021-09-11 LAB — LIPID PANEL W/O CHOL/HDL RATIO
Cholesterol, Total: 239 mg/dL — ABNORMAL HIGH (ref 100–199)
HDL: 69 mg/dL (ref >39)
LDL Chol Calc (NIH): 155 mg/dL — ABNORMAL HIGH (ref 0–99)
Triglycerides: 85 mg/dL (ref 0–149)
VLDL Cholesterol Cal: 15 mg/dL (ref 5–40)

## 2021-09-17 ENCOUNTER — Telehealth: Payer: Self-pay | Admitting: Gastroenterology

## 2021-09-17 ENCOUNTER — Other Ambulatory Visit: Payer: Self-pay

## 2021-09-17 DIAGNOSIS — Z86010 Personal history of colonic polyps: Secondary | ICD-10-CM

## 2021-09-17 MED ORDER — CLENPIQ 10-3.5-12 MG-GM -GM/160ML PO SOLN: 1.0000 | Freq: Once | ORAL | 0 refills | Status: AC

## 2021-09-17 NOTE — Telephone Encounter (Signed)
Procedure scheduled for 11/05/2021.

## 2021-09-17 NOTE — Telephone Encounter (Signed)
Inbound call from pt requesting to r/s her colonoscopy.

## 2021-09-17 NOTE — Progress Notes (Signed)
Gastroenterology Pre-Procedure Review  Request Date: 11/05/2021 Requesting Physician: Dr. Marius Ditch  PATIENT REVIEW QUESTIONS: The patient responded to the following health history questions as indicated:    1. Are you having any GI issues? no 2. Do you have a personal history of Polyps? yes (05/10/2015 polyps removed.) 3. Do you have a family history of Colon Cancer or Polyps? no 4. Diabetes Mellitus? no 5. Joint replacements in the past 12 months?no 6. Major health problems in the past 3 months?no 7. Any artificial heart valves, MVP, or defibrillator?no    MEDICATIONS & ALLERGIES:    Patient reports the following regarding taking any anticoagulation/antiplatelet therapy:   Plavix, Coumadin, Eliquis, Xarelto, Lovenox, Pradaxa, Brilinta, or Effient? no Aspirin? no  Patient confirms/reports the following medications:  Current Outpatient Medications  Medication Sig Dispense Refill   Cholecalciferol (VITAMIN D3) 2000 UNITS TABS Take 2,000 Units by mouth daily.      Multiple Vitamins-Minerals (PRESERVISION AREDS 2 PO) Take 1 tablet by mouth 2 (two) times daily.     Turmeric 500 MG TABS turmeric (bulk)     No current facility-administered medications for this visit.    Patient confirms/reports the following allergies:  No Known Allergies  No orders of the defined types were placed in this encounter.   AUTHORIZATION INFORMATION Primary Insurance: 1D#: Group #:  Secondary Insurance: 1D#: Group #:  SCHEDULE INFORMATION: Date: 11/05/2021 Time: Location: [place]

## 2021-10-10 ENCOUNTER — Ambulatory Visit (INDEPENDENT_AMBULATORY_CARE_PROVIDER_SITE_OTHER): Payer: Medicare Other | Admitting: *Deleted

## 2021-10-10 DIAGNOSIS — Z0000 Encounter for general adult medical examination without abnormal findings: Secondary | ICD-10-CM | POA: Diagnosis not present

## 2021-10-10 NOTE — Progress Notes (Signed)
Subjective:   Mary Galvan is a 70 y.o. female who presents for Medicare Annual (Subsequent) preventive examination.  I connected with  [name] on 10/10/21 by a telephone  enabled telemedicine application and verified that I am speaking with the correct person using two identifiers.   I discussed the limitations of evaluation and management by telemedicine. The patient expressed understanding and agreed to proceed.  Patient location: home  Provider location: [place]  not in office   Review of Systems     Cardiac Risk Factors include: advanced age (>70men, >29 women);hypertension     Objective:    Today's Vitals   There is no height or weight on file to calculate BMI.  Advanced Directives 10/10/2021 08/13/2020 05/16/2019 05/12/2018 09/02/2017 05/07/2017 04/21/2016  Does Patient Have a Medical Advance Directive? No No Yes No No No No  Type of Advance Directive - - Living will;Healthcare Power of Attorney - - - -  Does patient want to make changes to medical advance directive? - - - - - - -  Copy of [place] in Chart? - - No - copy requested - - - -  Would patient like information on creating a medical advance directive? No - Patient declined - - Yes (MAU/Ambulatory/Procedural Areas - Information given) No - Patient declined Yes (MAU/Ambulatory/Procedural Areas - Information given) Yes - Educational materials given    Current Medications (verified) Outpatient Encounter Medications as of 10/10/2021  Medication Sig   Cholecalciferol (VITAMIN D3) 2000 UNITS TABS Take 2,000 Units by mouth daily.    Multiple Vitamins-Minerals (PRESERVISION AREDS 2 PO) Take 1 tablet by mouth 2 (two) times daily.   Turmeric 500 MG TABS turmeric (bulk)   No facility-administered encounter medications on file as of 10/10/2021.    Allergies (verified) Patient has no known allergies.   History: Past Medical History:  Diagnosis Date   Actinic keratosis     Anxiety    Arthritis    thumbs   Basal cell carcinoma 10/20/2017   just under left brow   Depression    Dysplastic nevus 01/19/2018   right distal dorsum foot   Fatty liver disease, nonalcoholic    GERD (gastroesophageal reflux disease)    Heart murmur    Hyperlipidemia    Hypertension    Kidney cysts    Motion sickness    reading in cars   Ocular migraine    PONV (postoperative nausea and vomiting)    slow to wake   Rosacea    Seasonal affective disorder (HCC)    Seasonal allergies    Seizures (HCC)    x1 - after head trauma. 1970's   Vitreous detachment of left eye July 2012   [facility]   Past Surgical History:  Procedure Laterality Date   BASAL CELL CARCINOMA EXCISION Left    removed from above left eye    CHOLECYSTECTOMY  2015   COLONOSCOPY WITH PROPOFOL N/A 05/10/2015   Procedure: COLONOSCOPY WITH PROPOFOL;  Surgeon: Lucilla Lame, MD;  Location: [facility];  Service: Endoscopy;  Laterality: N/A;  WITH BIOPSY-- SIGMOID COLON POLYP  X  4 DESCENDING COLON POLYP   DILATATION & CURETTAGE/HYSTEROSCOPY WITH MYOSURE N/A 03/28/2016   Procedure: DILATATION & CURETTAGE/HYSTEROSCOPY WITH MYOSURE;  Surgeon: Boykin Nearing, MD;  Location: [facility];  Service: Gynecology;  Laterality: N/A;   ESOPHAGOGASTRODUODENOSCOPY (EGD) WITH PROPOFOL N/A 09/02/2017   Procedure: ESOPHAGOGASTRODUODENOSCOPY (EGD) WITH PROPOFOL;  Surgeon: Lin Landsman, MD;  Location: [place];  Service: Endoscopy;  Laterality: N/A;   MELANOMA EXCISION     pre melenoma excision on right foot    OTHER SURGICAL HISTORY     Uterine Polyp   Family History  Problem Relation Age of Onset   Cancer Mother        possible lung?   Dementia Mother    Cancer Sister        kidney   Diabetes Sister    COPD Sister    Cancer Paternal Grandfather        stomach and prostate   Diabetes Sister    Heart disease Sister    Hypertension Sister    COPD Sister    Stroke Maternal Uncle     Breast cancer Neg Hx    Social History   Socioeconomic History   Marital status: Married    Spouse name: Not on file   Number of children: Not on file   Years of education: Not on file   Highest education level: High school graduate  Occupational History   Occupation: retired  Tobacco Use   Smoking status: Former    Packs/day: 1.00    Years: 30.00    Pack years: 30.00    Types: Cigarettes    Quit date: 03/13/2000    Years since quitting: 21.5   Smokeless tobacco: Never  Vaping Use   Vaping Use: Never used  Substance and Sexual Activity   Alcohol use: Yes    Comment: occasional   Drug use: No   Sexual activity: Not Currently    Partners: Male  Other Topics Concern   Not on file  Social History Narrative   Not on file   Social Determinants of Health   Financial Resource Strain: Low Risk    Difficulty of Paying Living Expenses: Not hard at all  Food Insecurity: No Food Insecurity   Worried About Charity fundraiser in the Last Year: Never true   [place] in the Last Year: Never true  Transportation Needs: No Transportation Needs   Lack of Transportation (Medical): No   Lack of Transportation (Non-Medical): No  Physical Activity: Sufficiently Active   Days of Exercise per Week: 4 days   Minutes of Exercise per Session: 40 min  Stress: No Stress Concern Present   Feeling of Stress : Not at all  Social Connections: Moderately Isolated   Frequency of Communication with Friends and Family: More than three times a week   Frequency of Social Gatherings with Friends and Family: More than three times a week   Attends Religious Services: Never   Marine scientist or Organizations: No   Attends Music therapist: Never   Marital Status: Married    Tobacco Counseling Counseling given: Not Answered   Clinical Intake:  Pre-visit preparation completed: Yes  Pain : No/denies pain     Nutritional Risks: None Diabetes: No  How often do you  need to have someone help you when you read instructions, pamphlets, or other written materials from your doctor or pharmacy?: 1 - Never  Diabetic no  Interpreter Needed?: No  Information entered by :: Leroy Kennedy LPN   Activities of Daily Living In your present state of health, do you have any difficulty performing the following activities: 10/10/2021 09/10/2021  Hearing? N N  Vision? N Y  Difficulty concentrating or making decisions? N N  Walking or climbing stairs? N N  Dressing or bathing? N N  Doing errands, shopping? N N  Preparing Food and eating ? N -  Using the Toilet? N -  In the past six months, have you accidently leaked urine? N -  Do you have problems with loss of bowel control? N -  Managing your Medications? N -  Managing your Finances? N -  Housekeeping or managing your Housekeeping? N -  Some recent data might be hidden    Patient Care Team: Valerie Roys, DO as PCP - General (Family Medicine) Lucilla Lame, MD as Consulting Physician (Gastroenterology) Schermerhorn, Gwen Her, MD as Referring Physician (Obstetrics and Gynecology) [facility]) [facility] (Dermatology) Lyla Glassing, MD as Referring Physician (Ophthalmology)  Indicate any recent Medical Services you may have received from other than [facility] providers in the past year (date may be approximate).     Assessment:   This is a routine wellness examination for Jazlynn.  Hearing/Vision screen Hearing Screening - Comments:: No trouble hearing Vision Screening - Comments:: Up to date Dr. Edison Pace  Dietary issues and exercise activities discussed: Current Exercise Habits: Home exercise routine, Type of exercise: walking, Time (Minutes): 40, Intensity: Mild   Goals Addressed             This Visit's Progress    Increase water intake   On track    Recommend drinking at least 5-6 glasses of water a day.      Weight (lb) < 200 lb (90.7 kg)         Depression  Screen PHQ 2/9 Scores 10/10/2021 09/10/2021 02/20/2021 08/13/2020 01/23/2020 07/25/2019 05/16/2019  PHQ - 2 Score 0 0 0 0 0 0 0  PHQ- 9 Score 0 0 - 0 0 0 -    Fall Risk Fall Risk  10/10/2021 09/10/2021 02/20/2021 08/13/2020 01/23/2020  Falls in the past year? 0 0 0 0 0  Number falls in past yr: 0 0 0 - 0  Injury with Fall? 0 0 0 - 0  Risk for fall due to : - No Fall Risks - Medication side effect -  Follow up Falls evaluation completed;Education provided;Falls prevention discussed Falls evaluation completed - Falls evaluation completed;Education provided;Falls prevention discussed -    FALL RISK PREVENTION PERTAINING TO THE HOME:  Any stairs in or around the home? Yes  If so, are there any without handrails? No  Home free of loose throw rugs in walkways, pet beds, electrical cords, etc? Yes  Adequate lighting in your home to reduce risk of falls? Yes   ASSISTIVE DEVICES UTILIZED TO PREVENT FALLS:  Life alert? No  Use of a cane, walker or w/c? No  Grab bars in the bathroom? Yes  Shower chair or bench in shower? No  Elevated toilet seat or a handicapped toilet? Yes   TIMED UP AND GO:  Was the test performed? No .    Cognitive Function:  Normal cognitive status assessed by direct observation by this Nurse Health Advisor. No abnormalities found.       6CIT Screen 08/13/2020 05/12/2018 05/07/2017  What Year? 0 points 0 points 0 points  What month? 0 points 0 points 0 points  What time? 0 points 0 points 0 points  Count back from 20 0 points 0 points 0 points  Months in reverse 0 points 0 points 0 points  Repeat phrase 0 points 0 points 0 points  Total Score 0 0 0    Immunizations Immunization History  Administered Date(s) Administered   Fluad Quad(high Dose  65+) 07/25/2019, 08/13/2020, 09/10/2021   Influenza-Unspecified 08/18/2014, 09/20/2018   PFIZER(Purple Top)SARS-COV-2 Vaccination 11/22/2019, 12/13/2019, 08/02/2020   Pneumococcal Conjugate-13 05/03/2015   Pneumococcal  Polysaccharide-23 04/21/2016   Tdap 05/16/2013   Zoster Recombinat (Shingrix) 03/09/2018, 06/04/2018    TDAP status: Up to date  Flu Vaccine status: Up to date  Pneumococcal vaccine status: Up to date  Covid-19 vaccine status: Information provided on how to obtain vaccines.   Qualifies for Shingles Vaccine? Yes   Zostavax completed No   Shingrix Completed?: Yes  Screening Tests Health Maintenance  Topic Date Due   COLONOSCOPY (Pts 45-67yrs Insurance coverage will need to be confirmed)  05/09/2020   COVID-19 Vaccine (4 - Booster for Pfizer series) 09/27/2020   MAMMOGRAM  07/21/2021   TETANUS/TDAP  05/17/2023   Pneumonia Vaccine 3+ Years old  Completed   INFLUENZA VACCINE  Completed   DEXA SCAN  Completed   Hepatitis C Screening  Completed   Zoster Vaccines- Shingrix  Completed   HPV VACCINES  Aged Out    Health Maintenance  Health Maintenance Due  Topic Date Due   COLONOSCOPY (Pts 45-31yrs Insurance coverage will need to be confirmed)  05/09/2020   COVID-19 Vaccine (4 - Booster for [place] series) 09/27/2020   MAMMOGRAM  07/21/2021    Colonoscopy scheduled for January  Mammogram status: Ordered  . Pt provided with contact info and advised to call to schedule appt.   Bone Density status: Ordered  . Pt provided with contact info and advised to call to schedule appt.  Lung Cancer Screening: (Low Dose CT Chest recommended if Age 67-80 years, 30 pack-year currently smoking OR have quit w/in 15years.) does not qualify.   Lung Cancer Screening Referral:   Additional Screening:  Hepatitis C Screening: does not qualify; Completed 2017  Vision Screening: Recommended annual ophthalmology exams for early detection of glaucoma and other disorders of the eye. Is the patient up to date with their annual eye exam?  Yes  Who is the provider or what is the name of the office in which the patient attends annual eye exams? Dr. Edison Pace If pt is not established with a provider, would  they like to be referred to a provider to establish care? No .   Dental Screening: Recommended annual dental exams for proper oral hygiene  Community Resource Referral / Chronic Care Management: CRR required this visit?  No   CCM required this visit?  No      Plan:     I have personally reviewed and noted the following in the patients chart:   Medical and social history Use of alcohol, tobacco or illicit drugs  Current medications and supplements including opioid prescriptions.  Functional ability and status Nutritional status Physical activity Advanced directives List of other physicians Hospitalizations, surgeries, and ER visits in previous 12 months Vitals Screenings to include cognitive, depression, and falls Referrals and appointments  In addition, I have reviewed and discussed with patient certain preventive protocols, quality metrics, and best practice recommendations. A written personalized care plan for preventive services as well as general preventive health recommendations were provided to patient.     Leroy Kennedy, LPN   [id number]   Nurse Notes:

## 2021-10-10 NOTE — Patient Instructions (Signed)
Mary Galvan , Thank you for taking time to come for your Medicare Wellness Visit. I appreciate your ongoing commitment to your health goals. Please review the following plan we discussed and let me know if I can assist you in the future.   Screening recommendations/referrals: Colonoscopy: Scheduled Mammogram: Education provided Bone Density: Education provided Recommended yearly ophthalmology/optometry visit for glaucoma screening and checkup Recommended yearly dental visit for hygiene and checkup  Vaccinations: Influenza vaccine: up to date Pneumococcal vaccine: up to date Tdap vaccine: up to date Shingles vaccine: up to date    Advanced directives: Education provided  Conditions/risks identified:   Next appointment: 12-10-2021 @ 11:20  [facility] 65 Years and Older, Female Preventive care refers to lifestyle choices and visits with your health care provider that can promote health and wellness. What does preventive care include? A yearly physical exam. This is also called an annual well check. Dental exams once or twice a year. Routine eye exams. Ask your health care provider how often you should have your eyes checked. Personal lifestyle choices, including: Daily care of your teeth and gums. Regular physical activity. Eating a healthy diet. Avoiding tobacco and drug use. Limiting alcohol use. Practicing safe sex. Taking low-dose aspirin every day. Taking vitamin and mineral supplements as recommended by your health care provider. What happens during an annual well check? The services and screenings done by your health care provider during your annual well check will depend on your age, overall health, lifestyle risk factors, and family history of disease. Counseling  Your health care provider may ask you questions about your: Alcohol use. Tobacco use. Drug use. Emotional well-being. Home and relationship well-being. Sexual activity. Eating  habits. History of falls. Memory and ability to understand (cognition). Work and work Statistician. Reproductive health. Screening  You may have the following tests or measurements: Height, weight, and BMI. Blood pressure. Lipid and cholesterol levels. These may be checked every 5 years, or more frequently if you are over 26 years old. Skin check. Lung cancer screening. You may have this screening every year starting at age 73 if you have a 30-pack-year history of smoking and currently smoke or have quit within the past 15 years. Fecal occult blood test (FOBT) of the stool. You may have this test every year starting at age 13. Flexible sigmoidoscopy or colonoscopy. You may have a sigmoidoscopy every 5 years or a colonoscopy every 10 years starting at age 37. Hepatitis C blood test. Hepatitis B blood test. Sexually transmitted disease (STD) testing. Diabetes screening. This is done by checking your blood sugar (glucose) after you have not eaten for a while (fasting). You may have this done every 1-3 years. Bone density scan. This is done to screen for osteoporosis. You may have this done starting at age 15. Mammogram. This may be done every 1-2 years. Talk to your health care provider about how often you should have regular mammograms. Talk with your health care provider about your test results, treatment options, and if necessary, the need for more tests. Vaccines  Your health care provider may recommend certain vaccines, such as: Influenza vaccine. This is recommended every year. Tetanus, diphtheria, and acellular pertussis (Tdap, Td) vaccine. You may need a Td booster every 10 years. Zoster vaccine. You may need this after age 22. Pneumococcal 13-valent conjugate (PCV13) vaccine. One dose is recommended after age 65. Pneumococcal polysaccharide (PPSV23) vaccine. One dose is recommended after age 63. Talk to your health care provider about which  screenings and vaccines you need and how  often you need them. This information is not intended to replace advice given to you by your health care provider. Make sure you discuss any questions you have with your health care provider. Document Released: 10/26/2015 Document Revised: 06/18/2016 Document Reviewed: 07/31/2015 Elsevier Interactive Patient Education  2017 [place] Prevention in the Home Falls can cause injuries. They can happen to people of all ages. There are many things you can do to make your home safe and to help prevent falls. What can I do on the outside of my home? Regularly fix the edges of walkways and driveways and fix any cracks. Remove anything that might make you trip as you walk through a door, such as a raised step or threshold. Trim any bushes or trees on the path to your home. Use bright outdoor lighting. Clear any walking paths of anything that might make someone trip, such as rocks or tools. Regularly check to see if handrails are loose or broken. Make sure that both sides of any steps have handrails. Any raised decks and porches should have guardrails on the edges. Have any leaves, snow, or ice cleared regularly. Use sand or salt on walking paths during winter. Clean up any spills in your garage right away. This includes oil or grease spills. What can I do in the bathroom? Use night lights. Install grab bars by the toilet and in the tub and shower. Do not use towel bars as grab bars. Use non-skid mats or decals in the tub or shower. If you need to sit down in the shower, use a plastic, non-slip stool. Keep the floor dry. Clean up any water that spills on the floor as soon as it happens. Remove soap buildup in the tub or shower regularly. Attach bath mats securely with double-sided non-slip rug tape. Do not have throw rugs and other things on the floor that can make you trip. What can I do in the bedroom? Use night lights. Make sure that you have a light by your bed that is easy to  reach. Do not use any sheets or blankets that are too big for your bed. They should not hang down onto the floor. Have a firm chair that has side arms. You can use this for support while you get dressed. Do not have throw rugs and other things on the floor that can make you trip. What can I do in the kitchen? Clean up any spills right away. Avoid walking on wet floors. Keep items that you use a lot in easy-to-reach places. If you need to reach something above you, use a strong step stool that has a grab bar. Keep electrical cords out of the way. Do not use floor polish or wax that makes floors slippery. If you must use wax, use non-skid floor wax. Do not have throw rugs and other things on the floor that can make you trip. What can I do with my stairs? Do not leave any items on the stairs. Make sure that there are handrails on both sides of the stairs and use them. Fix handrails that are broken or loose. Make sure that handrails are as long as the stairways. Check any carpeting to make sure that it is firmly attached to the stairs. Fix any carpet that is loose or worn. Avoid having throw rugs at the top or bottom of the stairs. If you do have throw rugs, attach them to the floor with carpet  tape. Make sure that you have a light switch at the top of the stairs and the bottom of the stairs. If you do not have them, ask someone to add them for you. What else can I do to help prevent falls? Wear shoes that: Do not have high heels. Have rubber bottoms. Are comfortable and fit you well. Are closed at the toe. Do not wear sandals. If you use a stepladder: Make sure that it is fully opened. Do not climb a closed stepladder. Make sure that both sides of the stepladder are locked into place. Ask someone to hold it for you, if possible. Clearly mark and make sure that you can see: Any grab bars or handrails. First and last steps. Where the edge of each step is. Use tools that help you move  around (mobility aids) if they are needed. These include: Canes. Walkers. Scooters. Crutches. Turn on the lights when you go into a dark area. Replace any light bulbs as soon as they burn out. Set up your furniture so you have a clear path. Avoid moving your furniture around. If any of your floors are uneven, fix them. If there are any pets around you, be aware of where they are. Review your medicines with your doctor. Some medicines can make you feel dizzy. This can increase your chance of falling. Ask your doctor what other things that you can do to help prevent falls. This information is not intended to replace advice given to you by your health care provider. Make sure you discuss any questions you have with your health care provider. Document Released: 07/26/2009 Document Revised: 03/06/2016 Document Reviewed: 11/03/2014 Elsevier Interactive Patient Education  2017 [employer].

## 2021-10-24 ENCOUNTER — Encounter: Payer: Self-pay | Admitting: Nurse Practitioner

## 2021-10-24 ENCOUNTER — Telehealth (INDEPENDENT_AMBULATORY_CARE_PROVIDER_SITE_OTHER): Payer: Medicare Other | Admitting: Nurse Practitioner

## 2021-10-24 DIAGNOSIS — H1012 Acute atopic conjunctivitis, left eye: Secondary | ICD-10-CM

## 2021-10-24 NOTE — Progress Notes (Signed)
There were no vitals taken for this visit.   Subjective:    Patient ID: Mary Galvan, female    DOB: 12/29/1950, 71 y.o.   MRN: [id number]  HPI: Mary Galvan is a 71 y.o. female  Chief Complaint  Patient presents with   Eye Concern    Pt states she has had a red watery eye since yesterday. States it is her L eye. States she noticed a little bit of pus like stuff in the corner of it when she woke up this morning. States it is irrtitated as well.    EYE CONCERN Duration:  days Involved eye:  left Onset: sudden Severity: none Quality:  none Foreign body sensation:no Visual impairment: no Eye redness: yes Discharge: yes- watery Crusting or matting of eyelids: no Swelling: yes Photophobia: no Itching: yes Tearing: no Headache: no Floaters: yes URI symptoms:  mildly Contact lens use: no Close contacts with similar problems: no Eye trauma: no Aggravating factors:  Alleviating factors:  Status: better Treatments attempted:  Relevant past medical, surgical, family and social history reviewed and updated as indicated. Interim medical history since our last visit reviewed. Allergies and medications reviewed and updated.  Review of Systems  HENT:  Positive for congestion and ear discharge. Negative for ear pain.    Per HPI unless specifically indicated above     Objective:    There were no vitals taken for this visit.  Wt Readings from Last 3 Encounters:  09/10/21 141 lb 12.8 oz (64.3 kg)  02/20/21 170 lb (77.1 kg)  08/13/20 170 lb 3.2 oz (77.2 kg)    Physical Exam Vitals and nursing note reviewed.  Constitutional:      General: She is not in acute distress.    Appearance: She is not ill-appearing.  HENT:     Head: Normocephalic.     Right Ear: Hearing normal.     Left Ear: Hearing normal.     Nose: Nose normal.  Eyes:     General:        Left eye: No foreign body, discharge or hordeolum.     Comments: Slight inflammation on L eye lid.   Pulmonary:     Effort: Pulmonary effort is normal. No respiratory distress.  Neurological:     Mental Status: She is alert.  Psychiatric:        Mood and Affect: Mood normal.        Behavior: Behavior normal.        Thought Content: Thought content normal.        Judgment: Judgment normal.    Results for orders placed or performed in visit on 09/10/21  CBC with Differential/Platelet  Result Value Ref Range   WBC 6.0 3.4 - 10.8 x10E3/uL   RBC 4.72 3.77 - 5.28 x10E6/uL   Hemoglobin 15.0 11.1 - 15.9 g/dL   Hematocrit 43.7 34.0 - 46.6 %   MCV 93 79 - 97 fL   MCH 31.8 26.6 - 33.0 pg   MCHC 34.3 31.5 - 35.7 g/dL   RDW 12.2 11.7 - 15.4 %   Platelets 223 150 - 450 x10E3/uL   Neutrophils 47 Not Estab. %   Lymphs 42 Not Estab. %   Monocytes 8 Not Estab. %   Eos 2 Not Estab. %   Basos 1 Not Estab. %   Neutrophils Absolute 2.9 1.4 - 7.0 x10E3/uL   Lymphocytes Absolute 2.5 0.7 - 3.1 x10E3/uL   Monocytes Absolute 0.5 0.1 - 0.9 x10E3/uL  EOS (ABSOLUTE) 0.1 0.0 - 0.4 x10E3/uL   Basophils Absolute 0.0 0.0 - 0.2 x10E3/uL   Immature Granulocytes 0 Not Estab. %   Immature Grans (Abs) 0.0 0.0 - 0.1 x10E3/uL  Comprehensive metabolic panel  Result Value Ref Range   Glucose 87 70 - 99 mg/dL   BUN 17 8 - 27 mg/dL   Creatinine, Ser 0.68 0.57 - 1.00 mg/dL   eGFR 93 >59 mL/min/1.73   BUN/Creatinine Ratio 25 12 - 28   Sodium 140 134 - 144 mmol/L   Potassium 4.1 3.5 - 5.2 mmol/L   Chloride 101 96 - 106 mmol/L   CO2 26 20 - 29 mmol/L   Calcium 9.5 8.7 - 10.3 mg/dL   Total Protein 6.8 6.0 - 8.5 g/dL   Albumin 4.3 3.7 - 4.7 g/dL   Globulin, Total 2.5 1.5 - 4.5 g/dL   Albumin/Globulin Ratio 1.7 1.2 - 2.2   Bilirubin Total 0.4 0.0 - 1.2 mg/dL   Alkaline Phosphatase 82 44 - 121 IU/L   AST 23 0 - 40 IU/L   ALT 23 0 - 32 IU/L  Lipid Panel w/o Chol/HDL Ratio  Result Value Ref Range   Cholesterol, Total 239 (H) 100 - 199 mg/dL   Triglycerides 85 0 - 149 mg/dL   HDL 69 >39 mg/dL   VLDL  Cholesterol Cal 15 5 - 40 mg/dL   LDL Chol Calc (NIH) 155 (H) 0 - 99 mg/dL  Urinalysis, Routine w reflex microscopic  Result Value Ref Range   Specific Gravity, UA 1.020 1.005 - 1.030   pH, UA 7.0 5.0 - 7.5   Color, UA Yellow Yellow   Appearance Ur Clear Clear   Leukocytes,UA Negative Negative   Protein,UA Negative Negative/Trace   Glucose, UA Negative Negative   Ketones, UA Negative Negative   RBC, UA Negative Negative   Bilirubin, UA Negative Negative   Urobilinogen, Ur 0.2 0.2 - 1.0 mg/dL   Nitrite, UA Negative Negative  TSH  Result Value Ref Range   TSH 1.440 0.450 - 4.500 uIU/mL  Microalbumin, Urine Waived  Result Value Ref Range   Microalb, Ur Waived 10 0 - 19 mg/L   Creatinine, Urine Waived 50 10 - 300 mg/dL   Microalb/Creat Ratio <30 <30 mg/g  Bayer DCA Hb A1c Waived  Result Value Ref Range   HB A1C (BAYER DCA - WAIVED) 5.4 4.8 - 5.6 %      Assessment & Plan:   Problem List Items Addressed This Visit   None Visit Diagnoses     Allergic conjunctivitis of left eye    -  Primary   Recommend OTC histamine and warm compress to help with symptoms. FU if symptoms do not improve.         Follow up plan: Return if symptoms worsen or fail to improve.   This visit was completed via MyChart due to the restrictions of the COVID-19 pandemic. All issues as above were discussed and addressed. Physical exam was done as above through visual confirmation on MyChart. If it was felt that the patient should be evaluated in the office, they were directed there. The patient verbally consented to this visit. Location of the patient: Home Location of the provider: Office Those involved with this call:  Provider: Jon Billings, NP CMA: Yvonna Alanis, [place] Desk/Registration: Myrlene Broker This encounter was conducted via video.  I spent 15 dedicated to the care of this patient on the date of this encounter to include previsit review of  20, face to face time with the  patient, and post visit ordering of testing.

## 2021-11-04 ENCOUNTER — Encounter: Payer: Self-pay | Admitting: Gastroenterology

## 2021-11-05 ENCOUNTER — Ambulatory Visit: Payer: Medicare Other | Admitting: Anesthesiology

## 2021-11-05 ENCOUNTER — Ambulatory Visit
Admission: RE | Admit: 2021-11-05 | Discharge: 2021-11-05 | Disposition: A | Discharge status: Home / Self Care | Payer: Medicare Other | Attending: Gastroenterology | Admitting: Gastroenterology

## 2021-11-05 ENCOUNTER — Encounter: Payer: Self-pay | Admitting: Gastroenterology

## 2021-11-05 ENCOUNTER — Encounter
Admission: RE | Disposition: A | Discharge status: Home / Self Care | Payer: Self-pay | Source: Home / Self Care | Attending: Gastroenterology

## 2021-11-05 DIAGNOSIS — K644 Residual hemorrhoidal skin tags: Secondary | ICD-10-CM | POA: Insufficient documentation

## 2021-11-05 DIAGNOSIS — E785 Hyperlipidemia, unspecified: Secondary | ICD-10-CM | POA: Diagnosis not present

## 2021-11-05 DIAGNOSIS — K635 Polyp of colon: Secondary | ICD-10-CM | POA: Diagnosis not present

## 2021-11-05 DIAGNOSIS — Z860100 Personal history of colon polyps, unspecified: Secondary | ICD-10-CM

## 2021-11-05 DIAGNOSIS — K5790 Diverticulosis of intestine, part unspecified, without perforation or abscess without bleeding: Secondary | ICD-10-CM | POA: Diagnosis not present

## 2021-11-05 DIAGNOSIS — Z87891 Personal history of nicotine dependence: Secondary | ICD-10-CM | POA: Insufficient documentation

## 2021-11-05 DIAGNOSIS — D120 Benign neoplasm of cecum: Secondary | ICD-10-CM | POA: Diagnosis not present

## 2021-11-05 DIAGNOSIS — K5730 Diverticulosis of large intestine without perforation or abscess without bleeding: Secondary | ICD-10-CM | POA: Diagnosis not present

## 2021-11-05 DIAGNOSIS — Z1211 Encounter for screening for malignant neoplasm of colon: Secondary | ICD-10-CM | POA: Diagnosis not present

## 2021-11-05 DIAGNOSIS — K649 Unspecified hemorrhoids: Secondary | ICD-10-CM | POA: Diagnosis not present

## 2021-11-05 DIAGNOSIS — Z85828 Personal history of other malignant neoplasm of skin: Secondary | ICD-10-CM | POA: Insufficient documentation

## 2021-11-05 HISTORY — DX: Family history of other specified conditions: Z84.89

## 2021-11-05 HISTORY — PX: COLONOSCOPY WITH PROPOFOL: SHX5780

## 2021-11-05 SURGERY — COLONOSCOPY WITH PROPOFOL
Anesthesia: General

## 2021-11-05 MED ORDER — PROPOFOL 500 MG/50ML IV EMUL
INTRAVENOUS | Status: AC
  Filled 2021-11-05: qty 50

## 2021-11-05 MED ORDER — SODIUM CHLORIDE 0.9 % IV SOLN: INTRAVENOUS | Status: DC

## 2021-11-05 MED ORDER — PROPOFOL 500 MG/50ML IV EMUL
INTRAVENOUS | Status: DC | PRN
  Administered 2021-11-05: 150 ug/kg/min via INTRAVENOUS

## 2021-11-05 MED ORDER — PROPOFOL 10 MG/ML IV BOLUS
INTRAVENOUS | Status: DC | PRN
  Administered 2021-11-05: 50 mg via INTRAVENOUS
  Administered 2021-11-05: 20 mg via INTRAVENOUS

## 2021-11-05 MED ORDER — LIDOCAINE HCL (CARDIAC) PF 100 MG/5ML IV SOSY
PREFILLED_SYRINGE | INTRAVENOUS | Status: DC | PRN
  Administered 2021-11-05: 50 mg via INTRAVENOUS

## 2021-11-05 NOTE — Anesthesia Postprocedure Evaluation (Signed)
Anesthesia Post Note  Patient: Mary Galvan  Procedure(s) Performed: COLONOSCOPY WITH PROPOFOL  Patient location during evaluation: Endoscopy Anesthesia Type: General Level of consciousness: awake and alert Pain management: pain level controlled Vital Signs Assessment: post-procedure vital signs reviewed and stable Respiratory status: spontaneous breathing, nonlabored ventilation, respiratory function stable and patient connected to nasal cannula oxygen Cardiovascular status: blood pressure returned to baseline and stable Postop Assessment: no apparent nausea or vomiting Anesthetic complications: no   No notable events documented.   Last Vitals:  Vitals:   11/05/21 0811 11/05/21 0818  BP: 115/64 122/69  Pulse:    Resp:    Temp: (!) 36.1 C   SpO2:      Last Pain:  Vitals:   11/05/21 0829  TempSrc:   PainSc: 0-No pain                 Arita Miss

## 2021-11-05 NOTE — Op Note (Signed)
[facility] Patient Name: Mary Galvan Procedure Date: 11/05/2021 7:17 AM MRN: [id number] Account #: [id number] Date of Birth: Mar 02, 1951 Admit Type: Outpatient Age: 71 Room: [facility] ENDO ROOM 3 Gender: Female Note Status: Finalized Instrument Name: Jasper Riling [id number] Procedure:             Colonoscopy Indications:           Surveillance: Personal history of adenomatous polyps                         on last colonoscopy > 3 years ago, Last colonoscopy:                         July 2016 Providers:             Lin Landsman MD, MD Referring MD:          Valerie Roys (Referring MD) Medicines:             General Anesthesia Complications:         No immediate complications. Estimated blood loss: None. Procedure:             Pre-Anesthesia Assessment:                        - Prior to the procedure, a History and Physical was                         performed, and patient medications and allergies were                         reviewed. The patient is competent. The risks and                         benefits of the procedure and the sedation options and                         risks were discussed with the patient. All questions                         were answered and informed consent was obtained.                         Patient identification and proposed procedure were                         verified by the physician, the nurse, the                         anesthesiologist, the anesthetist and the technician                         in the pre-procedure area in the procedure room in the                         endoscopy suite. Mental Status Examination: alert and                         oriented. Airway Examination: normal oropharyngeal  airway and neck mobility. Respiratory Examination:                         clear to auscultation. CV Examination: normal.                         Prophylactic Antibiotics: The  patient does not require                         prophylactic antibiotics. Prior Anticoagulants: The                         patient has taken no previous anticoagulant or                         antiplatelet agents. ASA Grade Assessment: II - A                         patient with mild systemic disease. After reviewing                         the risks and benefits, the patient was deemed in                         satisfactory condition to undergo the procedure. The                         anesthesia plan was to use general anesthesia.                         Immediately prior to administration of medications,                         the patient was re-assessed for adequacy to receive                         sedatives. The heart rate, respiratory rate, oxygen                         saturations, blood pressure, adequacy of pulmonary                         ventilation, and response to care were monitored                         throughout the procedure. The physical status of the                         patient was re-assessed after the procedure.                        After obtaining informed consent, the colonoscope was                         passed under direct vision. Throughout the procedure,                         the patient's blood pressure, pulse, and oxygen  saturations were monitored continuously. The                         Colonoscope was introduced through the anus and                         advanced to the the cecum, identified by appendiceal                         orifice and ileocecal valve. The colonoscopy was                         performed without difficulty. The patient tolerated                         the procedure well. The quality of the bowel                         preparation was evaluated using the BBPS [facility] Bowel                         Preparation Scale) with scores of: Right Colon = 2                         (minor amount of  residual staining, small fragments of                         stool and/or opaque liquid, but mucosa seen well),                         Transverse Colon = 2 (minor amount of residual                         staining, small fragments of stool and/or opaque                         liquid, but mucosa seen well) and Left Colon = 2                         (minor amount of residual staining, small fragments of                         stool and/or opaque liquid, but mucosa seen well). The                         total BBPS score equals 6. Findings:      Hemorrhoids were found on perianal exam.      A 4 mm polyp was found in the cecum. The polyp was sessile. The polyp       was removed with a cold snare. Resection and retrieval were complete.       Estimated blood loss: none.      Multiple small-mouthed diverticula were found in the recto-sigmoid       colon, sigmoid colon and descending colon.      Non-bleeding external hemorrhoids were found during retroflexion. The       hemorrhoids were medium-sized.      Copious quantities of liquid stool was found  in the entire colon,       precluding visualization. Lavage of the area was performed using a large       amount of sterile water, resulting in clearance with fair visualization. Impression:            - Hemorrhoids found on perianal exam.                        - One 4 mm polyp in the cecum, removed with a cold                         snare. Resected and retrieved.                        - Diverticulosis in the recto-sigmoid colon, in the                         sigmoid colon and in the descending colon.                        - Non-bleeding external hemorrhoids.                        - Stool in the entire examined colon. Recommendation:        - Discharge patient to home (with escort).                        - Resume previous diet today.                        - Continue present medications.                        - Await pathology  results.                        - Repeat colonoscopy in 5 years for surveillance. Procedure Code(s):     --- Professional ---                        [phone], Colonoscopy, flexible; with removal of                         tumor(s), polyp(s), or other lesion(s) by snare                         technique Diagnosis Code(s):     --- Professional ---                        Z86.010, Personal history of colonic polyps                        K64.4, Residual hemorrhoidal skin tags                        K63.5, Polyp of colon                        K57.30, Diverticulosis of large intestine without  perforation or abscess without bleeding CPT copyright 2019 American Medical Association. All rights reserved. The codes documented in this report are preliminary and upon coder review may  be revised to meet current compliance requirements. Dr. Ulyess Mort Lin Landsman MD, MD 11/05/2021 8:09:23 AM This report has been signed electronically. Number of Addenda: 0 Note Initiated On: 11/05/2021 7:17 AM Scope Withdrawal Time: 0 hours 9 minutes 41 seconds  Total Procedure Duration: 0 hours 13 minutes 45 seconds  Estimated Blood Loss:  Estimated blood loss: none.      [facility]

## 2021-11-05 NOTE — H&P (Signed)
Cephas Darby, MD [address]  [place]  [place], [place] [address]  Main: [phone]  Fax: [phone] Pager: [phone]  Primary Care Physician:  Valerie Roys, DO Primary Gastroenterologist:  Dr. Cephas Darby  Pre-Procedure History & Physical: HPI:  Mary Galvan is a 71 y.o. female is here for an colonoscopy.   Past Medical History:  Diagnosis Date   Actinic keratosis    Anxiety    Arthritis    thumbs   Basal cell carcinoma 10/20/2017   just under left brow   Depression    Dysplastic nevus 01/19/2018   right distal dorsum foot   Family history of adverse reaction to anesthesia    Fatty liver disease, nonalcoholic    GERD (gastroesophageal reflux disease)    Heart murmur    Hyperlipidemia    Hypertension    Kidney cysts    Motion sickness    reading in cars   Ocular migraine    PONV (postoperative nausea and vomiting)    slow to wake   Rosacea    Seasonal affective disorder (HCC)    Seasonal allergies    Seizures (HCC)    x1 - after head trauma. 1970's   Vitreous detachment of left eye 04/2011   [facility]    Past Surgical History:  Procedure Laterality Date   BASAL CELL CARCINOMA EXCISION Left    removed from above left eye    CHOLECYSTECTOMY  2015   COLONOSCOPY WITH PROPOFOL N/A 05/10/2015   Procedure: COLONOSCOPY WITH PROPOFOL;  Surgeon: Lucilla Lame, MD;  Location: [place];  Service: Endoscopy;  Laterality: N/A;  WITH BIOPSY-- SIGMOID COLON POLYP  X  4 DESCENDING COLON POLYP   DILATATION & CURETTAGE/HYSTEROSCOPY WITH MYOSURE N/A 03/28/2016   Procedure: DILATATION & CURETTAGE/HYSTEROSCOPY WITH MYOSURE;  Surgeon: Boykin Nearing, MD;  Location: [facility];  Service: Gynecology;  Laterality: N/A;   ESOPHAGOGASTRODUODENOSCOPY (EGD) WITH PROPOFOL N/A 09/02/2017   Procedure: ESOPHAGOGASTRODUODENOSCOPY (EGD) WITH PROPOFOL;  Surgeon: Lin Landsman, MD;  Location: [place];  Service: Endoscopy;   Laterality: N/A;   MELANOMA EXCISION     pre melenoma excision on right foot    OTHER SURGICAL HISTORY     Uterine Polyp    Prior to Admission medications   Medication Sig Start Date End Date Taking? Authorizing Provider  Cholecalciferol (VITAMIN D3) 2000 UNITS TABS Take 2,000 Units by mouth daily.    Yes [provider]  Magnesium Chloride POWD Take 325 mg by mouth daily.   Yes [provider]  Multiple Vitamins-Minerals (PRESERVISION AREDS 2 PO) Take 1 tablet by mouth 2 (two) times daily.   Yes [provider]  Turmeric 500 MG TABS turmeric (bulk)   Yes [provider]    Allergies as of 09/17/2021   (No Known Allergies)    Family History  Problem Relation Age of Onset   Cancer Mother        possible lung?   Dementia Mother    Cancer Sister        kidney   Diabetes Sister    COPD Sister    Cancer Paternal Grandfather        stomach and prostate   Diabetes Sister    Heart disease Sister    Hypertension Sister    COPD Sister    Stroke Maternal Uncle    Breast cancer Neg Hx     Social History   Socioeconomic History   Marital  status: Married    Spouse name: Not on file   Number of children: Not on file   Years of education: Not on file   Highest education level: High school graduate  Occupational History   Occupation: retired  Tobacco Use   Smoking status: Former    Packs/day: 1.00    Years: 30.00    Pack years: 30.00    Types: Cigarettes    Quit date: 03/13/2000    Years since quitting: 21.6   Smokeless tobacco: Never  Vaping Use   Vaping Use: Never used  Substance and Sexual Activity   Alcohol use: Yes    Comment: occasional   Drug use: No   Sexual activity: Not Currently    Partners: Male  Other Topics Concern   Not on file  Social History Narrative   Not on file   Social Determinants of Health   Financial Resource Strain: Low Risk    Difficulty of Paying Living Expenses: Not hard at all  Food Insecurity: No  Food Insecurity   Worried About Charity fundraiser in the Last Year: Never true   [place] in the Last Year: Never true  Transportation Needs: No Transportation Needs   Lack of Transportation (Medical): No   Lack of Transportation (Non-Medical): No  Physical Activity: Sufficiently Active   Days of Exercise per Week: 4 days   Minutes of Exercise per Session: 40 min  Stress: No Stress Concern Present   Feeling of Stress : Not at all  Social Connections: Moderately Isolated   Frequency of Communication with Friends and Family: More than three times a week   Frequency of Social Gatherings with Friends and Family: More than three times a week   Attends Religious Services: Never   Marine scientist or Organizations: No   Attends Music therapist: Never   Marital Status: Married  Human resources officer Violence: Not At Risk   Fear of Current or Ex-Partner: No   Emotionally Abused: No   Physically Abused: No   Sexually Abused: No    Review of Systems: See HPI, otherwise negative ROS  Physical Exam: BP (!) 153/89    Pulse 66    Temp (!) 97.5 F (36.4 C) (Temporal)    Resp 16    Ht 5\' 4"  (1.626 m)    Wt 66.2 kg    SpO2 99%    BMI 25.06 kg/m  General:   Alert,  pleasant and cooperative in NAD Head:  Normocephalic and atraumatic. Neck:  Supple; no masses or thyromegaly. Lungs:  Clear throughout to auscultation.    Heart:  Regular rate and rhythm. Abdomen:  Soft, nontender and nondistended. Normal bowel sounds, without guarding, and without rebound.   Neurologic:  Alert and  oriented x4;  grossly normal neurologically.  Impression/Plan: Mary Galvan is here for an colonoscopy to be performed for h/o adenomas colon  Risks, benefits, limitations, and alternatives regarding  colonoscopy have been reviewed with the patient.  Questions have been answered.  All parties agreeable.   Sherri Sear, MD  11/05/2021, 7:35 AM

## 2021-11-05 NOTE — Anesthesia Preprocedure Evaluation (Signed)
Anesthesia Evaluation  Patient identified by MRN, date of birth, ID band Patient awake    Reviewed: Allergy & Precautions, NPO status , Patient's Chart, lab work & pertinent test results  History of Anesthesia Complications (+) PONV and history of anesthetic complications  Airway Mallampati: II  TM Distance: >3 FB Neck ROM: Full    Dental no notable dental hx. (+) Teeth Intact   Pulmonary neg sleep apnea, neg COPD, Patient abstained from smoking.Not current smoker, former smoker,  Patient has some allergies and congestion from her new dog. Denies SOB or fevers.   Pulmonary exam normal breath sounds clear to auscultation       Cardiovascular Exercise Tolerance: Good METShypertension, (-) CAD and (-) Past MI (-) dysrhythmias  Rhythm:Regular Rate:Normal - Systolic murmurs    Neuro/Psych  Headaches, neg Seizures PSYCHIATRIC DISORDERS Anxiety Depression    GI/Hepatic GERD  ,(+)     (-) substance abuse  ,   Endo/Other  neg diabetes  Renal/GU negative Renal ROS     Musculoskeletal  (+) Arthritis ,   Abdominal   Peds  Hematology   Anesthesia Other Findings Past Medical History: No date: Actinic keratosis No date: Anxiety No date: Arthritis     Comment:  thumbs 10/20/2017: Basal cell carcinoma     Comment:  just under left brow No date: Depression 01/19/2018: Dysplastic nevus     Comment:  right distal dorsum foot No date: Family history of adverse reaction to anesthesia No date: Fatty liver disease, nonalcoholic No date: GERD (gastroesophageal reflux disease) No date: Heart murmur No date: Hyperlipidemia No date: Hypertension No date: Kidney cysts No date: Motion sickness     Comment:  reading in cars No date: Ocular migraine No date: PONV (postoperative nausea and vomiting)     Comment:  slow to wake No date: Rosacea No date: Seasonal affective disorder (HCC) No date: Seasonal allergies No date:  Seizures ([place])     Comment:  x1 - after head trauma. 1970's 04/2011: Vitreous detachment of left eye     Comment:  [place]  Reproductive/Obstetrics                             Anesthesia Physical Anesthesia Plan  ASA: 2  Anesthesia Plan: General   Post-op Pain Management: Minimal or no pain anticipated   Induction: Intravenous  PONV Risk Score and Plan: 4 or greater and Propofol infusion, TIVA and Ondansetron  Airway Management Planned: Nasal Cannula  Additional Equipment: None  Intra-op Plan:   Post-operative Plan:   Informed Consent: I have reviewed the patients History and Physical, chart, labs and discussed the procedure including the risks, benefits and alternatives for the proposed anesthesia with the patient or authorized representative who has indicated his/her understanding and acceptance.     Dental advisory given  Plan Discussed with: CRNA and Surgeon  Anesthesia Plan Comments: (Discussed risks of anesthesia with patient, including possibility of difficulty with spontaneous ventilation under anesthesia necessitating airway intervention, PONV, and rare risks such as cardiac or respiratory or neurological events, and allergic reactions. Discussed the role of CRNA in patient's perioperative care. Patient understands.)        Anesthesia Quick Evaluation

## 2021-11-05 NOTE — Anesthesia Procedure Notes (Signed)
Procedure Name: MAC Date/Time: 11/05/2021 7:49 AM Performed by: Jerrye Noble, CRNA Pre-anesthesia Checklist: Patient identified, Emergency Drugs available, Suction available and Patient being monitored Patient Re-evaluated:Patient Re-evaluated prior to induction Oxygen Delivery Method: Nasal cannula

## 2021-11-05 NOTE — Transfer of Care (Signed)
Immediate Anesthesia Transfer of Care Note  Patient: Mary Galvan  Procedure(s) Performed: COLONOSCOPY WITH PROPOFOL  Patient Location: PACU and Endoscopy Unit  Anesthesia Type:General  Level of Consciousness: awake, drowsy and patient cooperative  Airway & Oxygen Therapy: Patient Spontanous Breathing  Post-op Assessment: Report given to RN and Post -op Vital signs reviewed and stable  Post vital signs: Reviewed and stable  Last Vitals:  Vitals Value Taken Time  BP 115/64 11/05/21 0811  Temp 36.1 C 11/05/21 0811  Pulse 63 11/05/21 0811  Resp 16 11/05/21 0811  SpO2 97 % 11/05/21 0811  Vitals shown include unvalidated device data.  Last Pain:  Vitals:   11/05/21 0811  TempSrc: Temporal  PainSc: 0-No pain         Complications: No notable events documented.

## 2021-11-06 ENCOUNTER — Encounter: Payer: Self-pay | Admitting: Gastroenterology

## 2021-11-06 LAB — SURGICAL PATHOLOGY

## 2021-12-04 DIAGNOSIS — H353132 Nonexudative age-related macular degeneration, bilateral, intermediate dry stage: Secondary | ICD-10-CM | POA: Diagnosis not present

## 2021-12-10 ENCOUNTER — Encounter: Payer: Self-pay | Admitting: Family Medicine

## 2021-12-10 ENCOUNTER — Ambulatory Visit
Admission: RE | Admit: 2021-12-10 | Discharge: 2021-12-10 | Disposition: A | Discharge status: Home / Self Care | Payer: Medicare Other | Source: Ambulatory Visit | Attending: Family Medicine | Admitting: Family Medicine

## 2021-12-10 ENCOUNTER — Other Ambulatory Visit: Payer: Self-pay

## 2021-12-10 ENCOUNTER — Ambulatory Visit (INDEPENDENT_AMBULATORY_CARE_PROVIDER_SITE_OTHER): Payer: Medicare Other | Admitting: Family Medicine

## 2021-12-10 VITALS — BP 131/73 | HR 73 | Temp 98.1°F | Ht 64.02 in | Wt 149.8 lb

## 2021-12-10 DIAGNOSIS — K760 Fatty (change of) liver, not elsewhere classified: Secondary | ICD-10-CM

## 2021-12-10 DIAGNOSIS — Z1231 Encounter for screening mammogram for malignant neoplasm of breast: Secondary | ICD-10-CM | POA: Diagnosis not present

## 2021-12-10 DIAGNOSIS — E782 Mixed hyperlipidemia: Secondary | ICD-10-CM

## 2021-12-10 DIAGNOSIS — Z13820 Encounter for screening for osteoporosis: Secondary | ICD-10-CM

## 2021-12-10 DIAGNOSIS — I10 Essential (primary) hypertension: Secondary | ICD-10-CM

## 2021-12-10 DIAGNOSIS — M85852 Other specified disorders of bone density and structure, left thigh: Secondary | ICD-10-CM | POA: Insufficient documentation

## 2021-12-10 DIAGNOSIS — Z780 Asymptomatic menopausal state: Secondary | ICD-10-CM | POA: Diagnosis not present

## 2021-12-10 NOTE — Assessment & Plan Note (Signed)
Doing great. Will recheck labs today. Await results.

## 2021-12-10 NOTE — Progress Notes (Signed)
BP 131/73    Pulse 73    Temp 98.1 F (36.7 C) (Oral)    Ht 5' 4.02" (1.626 m)    Wt 149 lb 12.8 oz (67.9 kg)    SpO2 98%    BMI 25.70 kg/m    Subjective:    Patient ID: [name], female    DOB: 06/28/1951, 71 y.o.   MRN: [id number]  HPI: KRIZIA FLIGHT is a 71 y.o. female  Chief Complaint  Patient presents with   Hypertension   HYPERTENSION / [place] Satisfied with current treatment? yes Duration of hypertension: chronic BP monitoring frequency: not checking BP medication side effects: no Past BP meds: hctz, none now Duration of hyperlipidemia: chronic Cholesterol medication side effects: not on anything Cholesterol supplements: none Medication compliance: not on anything Aspirin: no Recent stressors: no Recurrent headaches: no Visual changes: no Palpitations: no Dyspnea: no Chest pain: no Lower extremity edema: no Dizzy/lightheaded: no  Relevant past medical, surgical, family and social history reviewed and updated as indicated. Interim medical history since our last visit reviewed. Allergies and medications reviewed and updated.  Review of Systems  Constitutional: Negative.   Respiratory: Negative.    Cardiovascular: Negative.   Musculoskeletal: Negative.   Neurological: Negative.   Psychiatric/Behavioral: Negative.     Per HPI unless specifically indicated above     Objective:    BP 131/73    Pulse 73    Temp 98.1 F (36.7 C) (Oral)    Ht 5' 4.02" (1.626 m)    Wt 149 lb 12.8 oz (67.9 kg)    SpO2 98%    BMI 25.70 kg/m   Wt Readings from Last 3 Encounters:  12/10/21 149 lb 12.8 oz (67.9 kg)  11/05/21 146 lb (66.2 kg)  09/10/21 141 lb 12.8 oz (64.3 kg)    Physical Exam Vitals and nursing note reviewed.  Constitutional:      General: She is not in acute distress.    Appearance: Normal appearance. She is not ill-appearing, toxic-appearing or diaphoretic.  HENT:     Head: Normocephalic and atraumatic.     Right Ear: External ear  normal.     Left Ear: External ear normal.     Nose: Nose normal.     Mouth/Throat:     Mouth: Mucous membranes are moist.     Pharynx: Oropharynx is clear.  Eyes:     General: No scleral icterus.       Right eye: No discharge.        Left eye: No discharge.     Extraocular Movements: Extraocular movements intact.     Conjunctiva/sclera: Conjunctivae normal.     Pupils: Pupils are equal, round, and reactive to light.  Cardiovascular:     Rate and Rhythm: Normal rate and regular rhythm.     Pulses: Normal pulses.     Heart sounds: Normal heart sounds. No murmur heard.   No friction rub. No gallop.  Pulmonary:     Effort: Pulmonary effort is normal. No respiratory distress.     Breath sounds: Normal breath sounds. No stridor. No wheezing, rhonchi or rales.  Chest:     Chest wall: No tenderness.  Musculoskeletal:        General: Normal range of motion.     Cervical back: Normal range of motion and neck supple.  Skin:    General: Skin is warm and dry.     Capillary Refill: Capillary refill takes less than 2 seconds.  Coloration: Skin is not jaundiced or pale.     Findings: No bruising, erythema, lesion or rash.  Neurological:     General: No focal deficit present.     Mental Status: She is alert and oriented to person, place, and time. Mental status is at baseline.  Psychiatric:        Mood and Affect: Mood normal.        Behavior: Behavior normal.        Thought Content: Thought content normal.        Judgment: Judgment normal.    Results for orders placed or performed during the hospital encounter of 11/05/21  Surgical pathology  Result Value Ref Range   SURGICAL PATHOLOGY      SURGICAL PATHOLOGY CASE: [id number] PATIENT: Flo Shanks Surgical Pathology Report     Specimen Submitted: A. Colon polyp, cecum; cold snare  Clinical History: History of colonic polyps Z86.010.  Diverticulosis; polyps; hemorrhoids      DIAGNOSIS: A. COLON POLYP, CECUM;  COLD SNARE: - TUBULAR ADENOMA. - NEGATIVE FOR HIGH GRADE DYSPLASIA AND MALIGNANCY.   GROSS DESCRIPTION: A. Labeled: Cold snare cecal polyp Received: Formalin Collection time: 7:58 AM on 11/05/2021 Placed into formalin time: 7:58 AM on 11/05/2021 Tissue fragment(s): Multiple Size: Aggregate, 1 x 0.3 x 0.2 cm Description: Received is a single fragment of tan soft tissue admixed with intestinal debris.  The ratio of soft tissue to intestinal debris is 60: 40. Entirely submitted in 1 cassette.  RB 11/05/2021   Final Diagnosis performed by Betsy Pries, MD.   Electronically signed 11/06/2021 9:24:19AM The electronic signature indicates that the named Attending Pathologist has ev aluated the specimen Technical component performed at [facility], [address] [place], [place] [address] Lab: [id number] Dir: Rush Farmer, MD, MMM  Professional component performed at [facility], [facility], [place], [place], [place] [address] Lab: [phone] Dir: Kathi Simpers, MD       Assessment & Plan:   Problem List Items Addressed This Visit       Cardiovascular and Mediastinum   Hypertension - Primary    Doing great. Will recheck labs today. Await results.       Relevant Orders   Comprehensive metabolic panel     Digestive   Fatty liver disease, nonalcoholic    Doing great. Will recheck labs today. Await results.         Other   Hyperlipidemia    Doing great. Will recheck labs today. Await results.       Relevant Orders   Comprehensive metabolic panel   Lipid Panel w/o Chol/HDL Ratio     Follow up plan: Return after 09/10/22 for physical.

## 2021-12-11 LAB — LIPID PANEL W/O CHOL/HDL RATIO
Cholesterol, Total: 254 mg/dL — ABNORMAL HIGH (ref 100–199)
HDL: 69 mg/dL (ref >39)
LDL Chol Calc (NIH): 162 mg/dL — ABNORMAL HIGH (ref 0–99)
Triglycerides: 128 mg/dL (ref 0–149)
VLDL Cholesterol Cal: 23 mg/dL (ref 5–40)

## 2021-12-11 LAB — COMPREHENSIVE METABOLIC PANEL
ALT: 23 IU/L (ref 0–32)
AST: 20 IU/L (ref 0–40)
Albumin/Globulin Ratio: 1.6 (ref 1.2–2.2)
Albumin: 4.3 g/dL (ref 3.7–4.7)
Alkaline Phosphatase: 85 IU/L (ref 44–121)
BUN/Creatinine Ratio: 41 — ABNORMAL HIGH (ref 12–28)
BUN: 24 mg/dL (ref 8–27)
Bilirubin Total: 0.5 mg/dL (ref 0.0–1.2)
CO2: 26 mmol/L (ref 20–29)
Calcium: 9.8 mg/dL (ref 8.7–10.3)
Chloride: 99 mmol/L (ref 96–106)
Creatinine, Ser: 0.58 mg/dL (ref 0.57–1.00)
Globulin, Total: 2.7 g/dL (ref 1.5–4.5)
Glucose: 87 mg/dL (ref 70–99)
Potassium: 4 mmol/L (ref 3.5–5.2)
Sodium: 140 mmol/L (ref 134–144)
Total Protein: 7 g/dL (ref 6.0–8.5)
eGFR: 97 mL/min/{1.73_m2} (ref >59)

## 2021-12-12 DIAGNOSIS — L574 Cutis laxa senilis: Secondary | ICD-10-CM | POA: Diagnosis not present

## 2022-01-06 ENCOUNTER — Ambulatory Visit: Payer: Medicare Other | Admitting: Dermatology

## 2022-01-06 ENCOUNTER — Encounter: Payer: Self-pay | Admitting: Dermatology

## 2022-01-06 ENCOUNTER — Other Ambulatory Visit: Payer: Self-pay

## 2022-01-06 DIAGNOSIS — L820 Inflamed seborrheic keratosis: Secondary | ICD-10-CM | POA: Diagnosis not present

## 2022-01-06 DIAGNOSIS — L720 Epidermal cyst: Secondary | ICD-10-CM

## 2022-01-06 DIAGNOSIS — L578 Other skin changes due to chronic exposure to nonionizing radiation: Secondary | ICD-10-CM | POA: Diagnosis not present

## 2022-01-06 NOTE — Progress Notes (Signed)
? ?  Follow-Up Visit ?  ?Subjective  ?Mary Galvan is a 71 y.o. female who presents for the following: lesions (Having a procedure on face soon above eyebrows. Has recurrent scaly, dry patches). ?The patient has spots, moles and lesions to be evaluated, some may be new or changing and the patient has concerns that these could be cancer. ? ?The following portions of the chart were reviewed this encounter and updated as appropriate:  Tobacco  Allergies  Meds  Problems  Med Hx  Surg Hx  Fam Hx   ?  ?Review of Systems: No other skin or systemic complaints except as noted in HPI or Assessment and Plan. ? ?Objective  ?Well appearing patient in no apparent distress; mood and affect are within normal limits. ? ?A focused examination was performed including head, including the scalp, face, neck, nose, ears, eyelids, and lips. Relevant physical exam findings are noted in the Assessment and Plan. ? ?forehead and temples x13, cheeks x2, chest x3 (18) ?Erythematous keratotic or waxy stuck-on papule or plaque. ? ?Left Upper Back ?Subcutaneous nodule. 2.5 cm  ? ? ?Assessment & Plan  ? ?Actinic Damage ?- chronic, secondary to cumulative UV radiation exposure/sun exposure over time ?- diffuse scaly erythematous macules with underlying dyspigmentation ?- Recommend daily broad spectrum sunscreen SPF 30+ to sun-exposed areas, reapply every 2 hours as needed.  ?- Recommend staying in the shade or wearing long sleeves, sun glasses (UVA+UVB protection) and wide brim hats (4-inch brim around the entire circumference of the hat). ?- Call for new or changing lesions. ? ?Inflamed seborrheic keratosis (18) ?forehead and temples x13, cheeks x2, chest x3 ? ?Destruction of lesion - forehead and temples x13, cheeks x2, chest x3 ?Complexity: simple   ?Destruction method: cryotherapy   ?Informed consent: discussed and consent obtained   ?Timeout:  patient name, date of birth, surgical site, and procedure verified ?Lesion destroyed using  liquid nitrogen: Yes   ?Region frozen until ice ball extended beyond lesion: Yes   ?Outcome: patient tolerated procedure well with no complications   ?Post-procedure details: wound care instructions given   ? ?Epidermal inclusion cyst ?Left Upper Back ? ?Benign-appearing. Exam most consistent with an epidermal inclusion cyst. Discussed that a cyst is a benign growth that can grow over time and sometimes get irritated or inflamed. Recommend observation if it is not bothersome. Discussed option of surgical excision to remove it if it is growing, symptomatic, or other changes noted. Please call for new or changing lesions so they can be evaluated. ? ?RTC for cyst excision. ? ?Follow-up as scheduled for total-body skin exam ? ?I, Emelia Salisbury, CMA, am acting as scribe for Sarina Ser, MD. ?Documentation: I have reviewed the above documentation for accuracy and completeness, and I agree with the above. ? ?Sarina Ser, MD ? ? ?

## 2022-01-06 NOTE — Patient Instructions (Signed)
Cryotherapy Aftercare ? ?Wash gently with soap and water everyday.   ?Apply Vaseline and Band-Aid daily until healed.  ? ?Prior to procedure, discussed risks of blister formation, small wound, skin dyspigmentation, or rare scar following cryotherapy. Recommend Vaseline ointment to treated areas while healing.  ? ? ? ? ?Pre-Operative Instructions ? ?You are scheduled for a surgical procedure at [facility]. We recommend you read the following instructions. If you have any questions or concerns, please call the office at [phone]. ? ?Shower and wash the entire body with soap and water the day of your surgery paying special attention to cleansing at and around the planned surgery site. ? ?Avoid aspirin or aspirin containing products at least fourteen (14) days prior to your surgical procedure and for at least one week (7 Days) after your surgical procedure. If you take aspirin on a regular basis for heart disease or history of stroke or for any other reason, we may recommend you continue taking aspirin but please notify us if you take this on a regular basis. Aspirin can cause more bleeding to occur during surgery as well as prolonged bleeding and bruising after surgery.  ? ?Avoid other nonsteroidal pain medications at least one week prior to surgery and at least one week prior to your surgery. These include medications such as Ibuprofen (Motrin, Advil and Nuprin), Naprosyn, Voltaren, Relafen, etc. If medications are used for therapeutic reasons, please inform us as they can cause increased bleeding or prolonged bleeding during and bruising after surgical procedures.  ? ?Please advise [place] if you are taking any "blood thinner" medications such as Coumadin or Dipyridamole or Plavix or similar medications. These cause increased bleeding and prolonged bleeding during procedures and bruising after surgical procedures. We may have to consider discontinuing these medications briefly prior to and shortly after your  surgery if safe to do so.  ? ?Please inform us of all medications you are currently taking. All medications that are taken regularly should be taken the day of surgery as you always do. Nevertheless, we need to be informed of what medications you are taking prior to surgery to know whether they will affect the procedure or cause any complications.  ? ?Please inform us of any medication allergies. Also inform us of whether you have allergies to Latex or rubber products or whether you have had any adverse reaction to Lidocaine or Epinephrine. ? ?Please inform us of any prosthetic or artificial body parts such as artificial heart valve, joint replacements, etc., or similar condition that might require preoperative antibiotics.  ? ?We recommend avoidance of alcohol at least two weeks prior to surgery and continued avoidance for at least two weeks after surgery.  ? ?We recommend discontinuation of tobacco smoking at least two weeks prior to surgery and continued abstinence for at least two weeks after surgery. ? ?Do not plan strenuous exercise, strenuous work or strenuous lifting for approximately four weeks after your surgery.  ? ?We request if you are unable to make your scheduled surgical appointment, please call us at least a week in advance or as soon as you are aware of a problem so that we can cancel or reschedule the appointment.  ? ?You MAY TAKE TYLENOL (acetaminophen) for pain as it is not a blood thinner.  ? ?PLEASE PLAN TO BE IN TOWN FOR TWO WEEKS FOLLOWING SURGERY, THIS IS IMPORTANT SO YOU CAN BE CHECKED FOR DRESSING CHANGES, SUTURE REMOVAL AND TO MONITOR FOR POSSIBLE COMPLICATIONS.  ? ?If You Need Anything  After Your Visit ? ?If you have any questions or concerns for your doctor, please call our main line at [phone] and press option 4 to reach your doctor's medical assistant. If no one answers, please leave a voicemail as directed and we will return your call as soon as possible. Messages left after 4  pm will be answered the following business day.  ? ?You may also send [place] a message via MyChart. We typically respond to MyChart messages within 1-2 business days. ? ?For prescription refills, please ask your pharmacy to contact our office. Our fax number is [phone]. ? ?If you have an urgent issue when the clinic is closed that cannot wait until the next business day, you can page your doctor at the number below.   ? ?Please note that while we do our best to be available for urgent issues outside of office hours, we are not available 24/7.  ? ?If you have an urgent issue and are unable to reach [place], you may choose to seek medical care at your doctor's office, retail clinic, urgent care center, or emergency room. ? ?If you have a medical emergency, please immediately call 911 or go to the emergency department. ? ?Pager Numbers ? ?- Dr. Nehemiah Massed: [phone] ? ?- Dr. Laurence Ferrari: [phone] ? ?- Dr. Nicole Kindred: [phone] ? ?In the event of inclement weather, please call our main line at [phone] for an update on the status of any delays or closures. ? ?Dermatology Medication Tips: ?Please keep the boxes that topical medications come in in order to help keep track of the instructions about where and how to use these. Pharmacies typically print the medication instructions only on the boxes and not directly on the medication tubes.  ? ?If your medication is too expensive, please contact our office at [phone] option 4 or send [place] a message through [place].  ? ?We are unable to tell what your co-pay for medications will be in advance as this is different depending on your insurance coverage. However, we may be able to find a substitute medication at lower cost or fill out paperwork to get insurance to cover a needed medication.  ? ?If a prior authorization is required to get your medication covered by your insurance company, please allow [place] 1-2 business days to complete this process. ? ?Drug prices often vary  depending on where the prescription is filled and some pharmacies may offer cheaper prices. ? ?The website www.goodrx.com contains coupons for medications through different pharmacies. The prices here do not account for what the cost may be with help from insurance (it may be cheaper with your insurance), but the website can give you the price if you did not use any insurance.  ?- You can print the associated coupon and take it with your prescription to the pharmacy.  ?- You may also stop by our office during regular business hours and pick up a GoodRx coupon card.  ?- If you need your prescription sent electronically to a different pharmacy, notify our office through [facility] or by phone at [phone] option 4. ? ? ? ? ?Si Usted Necesita Algo Despu?s de Su Visita ? ?Tambi?n puede enviarnos un mensaje a trav?s de MyChart. Por lo general respondemos a los mensajes de MyChart en el transcurso de 1 a 2 d?as h?biles. ? ?Para renovar recetas, por favor pida a su farmacia que se ponga en contacto con nuestra oficina. Nuestro n?mero de fax es el [phone]. ? ?Si tiene un asunto urgente  cuando la cl?nica est? cerrada y que no puede esperar hasta el siguiente d?a h?bil, puede llamar/localizar a su doctor(a) al n?mero que aparece a continuaci?n.  ? ?Por favor, tenga en cuenta que aunque hacemos todo lo posible para estar disponibles para asuntos urgentes fuera del horario de oficina, no estamos disponibles las 24 horas del d?a, los 7 d?as de la semana.  ? ?Si tiene un problema urgente y no puede comunicarse con nosotros, puede optar por buscar atenci?n m?dica  en el consultorio de su doctor(a), en una cl?nica privada, en un centro de atenci?n urgente o en una sala de emergencias. ? ?Si tiene Engineer, maintenance (IT) m?dica, por favor llame inmediatamente al 911 o vaya a la sala de emergencias. ? ?N?meros de b?per ? ?- Dr. Nehemiah Massed: [phone] ? ?- Dra. Moye: [phone] ? ?- Dra. Nicole Kindred: [phone] ? ?En caso de  inclemencias del tiempo, por favor llame a nuestra l?nea principal al [phone] para una actualizaci?n sobre el estado de cualquier retraso o cierre. ? ?Consejos para la medicaci?n en dermatolog?a: ?Por

## 2022-01-07 ENCOUNTER — Encounter: Payer: Self-pay | Admitting: Dermatology

## 2022-02-25 ENCOUNTER — Ambulatory Visit: Payer: Medicare Other | Admitting: Dermatology

## 2022-02-25 DIAGNOSIS — D485 Neoplasm of uncertain behavior of skin: Secondary | ICD-10-CM

## 2022-02-25 DIAGNOSIS — L720 Epidermal cyst: Secondary | ICD-10-CM | POA: Diagnosis not present

## 2022-02-25 MED ORDER — MUPIROCIN 2 % EX OINT: 1.0000 "application " | TOPICAL_OINTMENT | Freq: Every day | CUTANEOUS | 0 refills | Status: DC

## 2022-02-25 NOTE — Progress Notes (Signed)
Follow-Up Visit   Subjective  Mary Galvan is a 71 y.o. female who presents for the following: Cyst (Vs other of left upper back - excise today).  The following portions of the chart were reviewed this encounter and updated as appropriate:   Tobacco  Allergies  Meds  Problems  Med Hx  Surg Hx  Fam Hx     Review of Systems:  No other skin or systemic complaints except as noted in HPI or Assessment and Plan.  Objective  Well appearing patient in no apparent distress; mood and affect are within normal limits.  A focused examination was performed including back. Relevant physical exam findings are noted in the Assessment and Plan.  Left Upper Back 1.1 cm cystic papule    Left Upper Back 0.8cm cystic pap   Assessment & Plan  Neoplasm of uncertain behavior of skin (2) Left Upper Back  Skin excision  Lesion length (cm):  1.1 Lesion width (cm):  1.1 Margin per side (cm):  0 Total excision diameter (cm):  1.1 Informed consent: discussed and consent obtained   Timeout: patient name, date of birth, surgical site, and procedure verified   Procedure prep:  Patient was prepped and draped in usual sterile fashion Prep type:  Isopropyl alcohol and povidone-iodine Anesthesia: the lesion was anesthetized in a standard fashion   Anesthetic:  1% lidocaine w/ epinephrine 1-100,000 buffered w/ 8.4% NaHCO3 (3cc of lido w/ epi, 3cc of bupivicaine, Total of 6cc) Instrument used: #15 blade   Hemostasis achieved with: pressure   Hemostasis achieved with comment:  Electrocautery Outcome: patient tolerated procedure well with no complications   Post-procedure details: sterile dressing applied and wound care instructions given   Dressing type: bandage and pressure dressing (mupirocin)    mupirocin ointment (BACTROBAN) 2 % Apply 1 application. topically daily. With dressing changes  Specimen 1 - Surgical pathology Differential Diagnosis: Cyst vs other  Check Margins: No 2  specimens in this bottle  Left Upper Back  Skin excision  Lesion length (cm):  0.8 Lesion width (cm):  0.8 Margin per side (cm):  0 Total excision diameter (cm):  0.8 Informed consent: discussed and consent obtained   Timeout: patient name, date of birth, surgical site, and procedure verified   Procedure prep:  Patient was prepped and draped in usual sterile fashion Prep type:  Isopropyl alcohol and povidone-iodine Anesthesia: the lesion was anesthetized in a standard fashion   Anesthetic:  1% lidocaine w/ epinephrine 1-100,000 buffered w/ 8.4% NaHCO3 (3cc of lido w/ epi, 3cc of bupivicaine, Total of 6cc) Instrument used: #15 blade   Hemostasis achieved with: pressure   Hemostasis achieved with comment:  Electrocautery Outcome: patient tolerated procedure well with no complications   Post-procedure details: sterile dressing applied and wound care instructions given   Dressing type: bandage and bacitracin (mupirocin)    Skin repair Complexity:  Complex Final length (cm):  4 Reason for type of repair: reduce tension to allow closure, reduce the risk of dehiscence, infection, and necrosis, reduce subcutaneous dead space and avoid a hematoma, allow closure of the large defect, preserve normal anatomy, preserve normal anatomical and functional relationships and enhance both functionality and cosmetic results   Undermining: area extensively undermined   Undermining comment:  Undermining defect 1.1cm Subcutaneous layers (deep stitches):  Suture size:  2-0 Suture type: Vicryl (polyglactin 910)   Subcutaneous suture technique: inverted dermal. Fine/surface layer approximation (top stitches):  Suture size:  3-0 Suture type: nylon   Stitches: simple  running   Suture removal (days):  7 Hemostasis achieved with: suture and pressure Outcome: patient tolerated procedure well with no complications   Post-procedure details: sterile dressing applied and wound care instructions given   Dressing  type: bandage, pressure dressing and bacitracin (mupirocin)     Return in about 1 week (around 03/04/2022) for suture removal and TBSE.  I, Ashok Cordia, CMA, am acting as scribe for Sarina Ser, MD . Documentation: I have reviewed the above documentation for accuracy and completeness, and I agree with the above.  Sarina Ser, MD

## 2022-02-25 NOTE — Patient Instructions (Signed)
Wound Care Instructions ? ?On the day following your surgery, you should begin doing daily dressing changes: ?Remove the old dressing and discard it. ?Cleanse the wound gently with tap water. This may be done in the shower or by placing a wet gauze pad directly on the wound and letting it soak for several minutes. ?It is important to gently remove any dried blood from the wound in order to encourage healing. This may be done by gently rolling a moistened Q-tip on the dried blood. Do not pick at the wound. ?If the wound should start to bleed, continue cleaning the wound, then place a moist gauze pad on the wound and hold pressure for a few minutes.  ?Make sure you then dry the skin surrounding the wound completely or the tape will not stick to the skin. Do not use cotton balls on the wound. ?After the wound is clean and dry, apply the ointment gently with a Q-tip. ?Cut a non-stick pad to fit the size of the wound. Lay the pad flush to the wound. If the wound is draining, you may want to reinforce it with a small amount of gauze on top of the non-stick pad for a little added compression to the area. ?Use the tape to seal the area completely. ?Select from the following with respect to your individual situation: ?If your wound has been stitched closed: continue the above steps 1-8 at least daily until your sutures are removed. ?If your wound has been left open to heal: continue steps 1-8 at least daily for the first 3-4 weeks. ?We would like for you to take a few extra precautions for at least the next week. ?Sleep with your head elevated on pillows if our wound is on your head. ?Do not bend over or lift heavy items to reduce the chance of elevated blood pressure to the wound ?Do not participate in particularly strenuous activities. ? ? ?Below is a list of dressing supplies you might need.  ?Cotton-tipped applicators - Q-tips ?Gauze pads (2x2 and/or 4x4) - All-Purpose Sponges ?Non-stick dressing material - Telfa ?Tape -  Paper or Hypafix ?New and clean tube of petroleum jelly - Vaseline  ? ? ?Comments on Post-Operative Period ?Slight swelling and redness often appear around the wound. This is normal and will disappear within several days following the surgery. ?The healing wound will drain a brownish-red-yellow discharge during healing. This is a normal phase of wound healing. As the wound begins to heal, the drainage may increase in amount. Again, this drainage is normal. ?Notify us if the drainage becomes persistently bloody, excessively swollen, or intensely painful or develops a foul odor or red streaks.  ?If you should experience mild discomfort during the healing phase, you may take an aspirin-free medication such as Tylenol (acetaminophen). Notify us if the discomfort is severe or persistent. Avoid alcoholic beverages when taking pain medicine. ? ?In Case of Wound Hemorrhage ?A wound hemorrhage is when the bandage suddenly becomes soaked with bright red blood and flows profusely. If this happens, sit down or lie down with your head elevated. If the wound has a dressing on it, do not remove the dressing. Apply pressure to the existing gauze. If the wound is not covered, use a gauze pad to apply pressure and continue applying the pressure for 20 minutes without peeking. DO NOT COVER THE WOUND WITH A LARGE TOWEL OR WASH CLOTH. Release your hand from the wound site but do not remove the dressing. If the bleeding has stopped,   gently clean around the wound. Leave the dressing in place for 24 hours if possible. This wait time allows the blood vessels to close off so that you do not spark a new round of bleeding by disrupting the newly clotted blood vessels with an immediate dressing change. If the bleeding does not subside, continue to hold pressure. If matters are out of your control, contact an After Hours clinic or go to the Emergency Room. ? ? ? ?If You Need Anything After Your Visit ? ?If you have any questions or concerns for  your doctor, please call our main line at [phone] and press option 4 to reach your doctor's medical assistant. If no one answers, please leave a voicemail as directed and we will return your call as soon as possible. Messages left after 4 pm will be answered the following business day.  ? ?You may also send [place] a message via MyChart. We typically respond to MyChart messages within 1-2 business days. ? ?For prescription refills, please ask your pharmacy to contact our office. Our fax number is [phone]. ? ?If you have an urgent issue when the clinic is closed that cannot wait until the next business day, you can page your doctor at the number below.   ? ?Please note that while we do our best to be available for urgent issues outside of office hours, we are not available 24/7.  ? ?If you have an urgent issue and are unable to reach [place], you may choose to seek medical care at your doctor's office, retail clinic, urgent care center, or emergency room. ? ?If you have a medical emergency, please immediately call 911 or go to the emergency department. ? ?Pager Numbers ? ?- Dr. Nehemiah Massed: [phone] ? ?- Dr. Laurence Ferrari: [phone] ? ?- Dr. Nicole Kindred: [phone] ? ?In the event of inclement weather, please call our main line at [phone] for an update on the status of any delays or closures. ? ?Dermatology Medication Tips: ?Please keep the boxes that topical medications come in in order to help keep track of the instructions about where and how to use these. Pharmacies typically print the medication instructions only on the boxes and not directly on the medication tubes.  ? ?If your medication is too expensive, please contact our office at [phone] option 4 or send [place] a message through [place].  ? ?We are unable to tell what your co-pay for medications will be in advance as this is different depending on your insurance coverage. However, we may be able to find a substitute medication at lower cost or fill out  paperwork to get insurance to cover a needed medication.  ? ?If a prior authorization is required to get your medication covered by your insurance company, please allow [place] 1-2 business days to complete this process. ? ?Drug prices often vary depending on where the prescription is filled and some pharmacies may offer cheaper prices. ? ?The website www.goodrx.com contains coupons for medications through different pharmacies. The prices here do not account for what the cost may be with help from insurance (it may be cheaper with your insurance), but the website can give you the price if you did not use any insurance.  ?- You can print the associated coupon and take it with your prescription to the pharmacy.  ?- You may also stop by our office during regular business hours and pick up a GoodRx coupon card.  ?- If you need your prescription sent electronically to a different pharmacy, notify our  office through [facility] or by phone at [phone] option 4. ? ? ? ? ?Si Usted Necesita Algo Despu?s de Su Visita ? ?Tambi?n puede enviarnos un mensaje a trav?s de MyChart. Por lo general respondemos a los mensajes de MyChart en el transcurso de 1 a 2 d?as h?biles. ? ?Para renovar recetas, por favor pida a su farmacia que se ponga en contacto con nuestra oficina. Nuestro n?mero de fax es el [phone]. ? ?Si tiene un asunto urgente cuando la cl?nica est? cerrada y que no puede esperar hasta el siguiente d?a h?bil, puede llamar/localizar a su doctor(a) al n?mero que aparece a continuaci?n.  ? ?Por favor, tenga en cuenta que aunque hacemos todo lo posible para estar disponibles para asuntos urgentes fuera del horario de oficina, no estamos disponibles las 24 horas del d?a, los 7 d?as de la semana.  ? ?Si tiene un problema urgente y no puede comunicarse con nosotros, puede optar por buscar atenci?n m?dica  en el consultorio de su doctor(a), en una cl?nica privada, en un centro de atenci?n urgente o en una sala de  emergencias. ? ?Si tiene Engineer, maintenance (IT) m?dica, por favor llame inmediatamente al 911 o vaya a la sala de emergencias. ? ?N?meros de b?per ? ?- Dr. Nehemiah Massed: [phone] ? ?- Dra. Moye: [phone] ? ?- Dra

## 2022-03-04 ENCOUNTER — Ambulatory Visit (INDEPENDENT_AMBULATORY_CARE_PROVIDER_SITE_OTHER): Payer: Medicare Other | Admitting: Dermatology

## 2022-03-04 DIAGNOSIS — I8393 Asymptomatic varicose veins of bilateral lower extremities: Secondary | ICD-10-CM | POA: Diagnosis not present

## 2022-03-04 DIAGNOSIS — Z1283 Encounter for screening for malignant neoplasm of skin: Secondary | ICD-10-CM

## 2022-03-04 DIAGNOSIS — L821 Other seborrheic keratosis: Secondary | ICD-10-CM | POA: Diagnosis not present

## 2022-03-04 DIAGNOSIS — D229 Melanocytic nevi, unspecified: Secondary | ICD-10-CM

## 2022-03-04 DIAGNOSIS — L578 Other skin changes due to chronic exposure to nonionizing radiation: Secondary | ICD-10-CM

## 2022-03-04 DIAGNOSIS — L720 Epidermal cyst: Secondary | ICD-10-CM

## 2022-03-04 DIAGNOSIS — L719 Rosacea, unspecified: Secondary | ICD-10-CM

## 2022-03-04 DIAGNOSIS — D1800 Hemangioma unspecified site: Secondary | ICD-10-CM

## 2022-03-04 DIAGNOSIS — L814 Other melanin hyperpigmentation: Secondary | ICD-10-CM | POA: Diagnosis not present

## 2022-03-04 DIAGNOSIS — L820 Inflamed seborrheic keratosis: Secondary | ICD-10-CM

## 2022-03-04 DIAGNOSIS — Z85828 Personal history of other malignant neoplasm of skin: Secondary | ICD-10-CM | POA: Diagnosis not present

## 2022-03-04 DIAGNOSIS — L570 Actinic keratosis: Secondary | ICD-10-CM

## 2022-03-04 DIAGNOSIS — Z4802 Encounter for removal of sutures: Secondary | ICD-10-CM

## 2022-03-04 NOTE — Progress Notes (Signed)
Follow-Up Visit   Subjective  Mary Galvan is a 71 y.o. female who presents for the following: Suture / Staple Removal and Annual Exam. The patient presents for Total-Body Skin Exam (TBSE) for skin cancer screening and mole check.  The patient has spots, moles and lesions to be evaluated, some may be new or changing and the patient has concerns that these could be cancer.   The following portions of the chart were reviewed this encounter and updated as appropriate:   Tobacco  Allergies  Meds  Problems  Med Hx  Surg Hx  Fam Hx     Review of Systems:  No other skin or systemic complaints except as noted in HPI or Assessment and Plan.  Objective  Well appearing patient in no apparent distress; mood and affect are within normal limits.  A full examination was performed including scalp, head, eyes, ears, nose, lips, neck, chest, axillae, abdomen, back, buttocks, bilateral upper extremities, bilateral lower extremities, hands, feet, fingers, toes, fingernails, and toenails. All findings within normal limits unless otherwise noted below.  chest x 3, left temple x 1  (4) (4) Erythematous thin papules/macules with gritty scale.   left inframammary x 1 Stuck-on, waxy, tan-brown papule--Discussed benign etiology and prognosis.   Head - Anterior (Face) Mid face erythema with telangiectasias    Assessment & Plan  AK (actinic keratosis) (4) chest x 3, left temple x 1  (4)  Actinic keratoses are precancerous spots that appear secondary to cumulative UV radiation exposure/sun exposure over time. They are chronic with expected duration over 1 year. A portion of actinic keratoses will progress to squamous cell carcinoma of the skin. It is not possible to reliably predict which spots will progress to skin cancer and so treatment is recommended to prevent development of skin cancer.  Recommend daily broad spectrum sunscreen SPF 30+ to sun-exposed areas, reapply every 2 hours as needed.   Recommend staying in the shade or wearing long sleeves, sun glasses (UVA+UVB protection) and wide brim hats (4-inch brim around the entire circumference of the hat). Call for new or changing lesions.   Destruction of lesion - chest x 3, left temple x 1  (4) Complexity: simple   Destruction method: cryotherapy   Informed consent: discussed and consent obtained   Timeout:  patient name, date of birth, surgical site, and procedure verified Lesion destroyed using liquid nitrogen: Yes   Region frozen until ice ball extended beyond lesion: Yes   Outcome: patient tolerated procedure well with no complications   Post-procedure details: wound care instructions given    Inflamed seborrheic keratosis left inframammary x 1  Destruction of lesion - left inframammary x 1 Complexity: simple   Destruction method: cryotherapy   Informed consent: discussed and consent obtained   Timeout:  patient name, date of birth, surgical site, and procedure verified Lesion destroyed using liquid nitrogen: Yes   Region frozen until ice ball extended beyond lesion: Yes   Outcome: patient tolerated procedure well with no complications   Post-procedure details: wound care instructions given    Epidermal inclusion cyst -status post excision left upper back Biopsy results discussed EPIDERMOID CYST, INFLAMED  Encounter for Removal of Sutures - Incision site at the left upper back is clean, dry and intact - Wound cleansed, sutures removed, wound cleansed and steri strips applied.  - Discussed pathology results showing EPIDERMOID CYST, INFLAMED  - Patient advised to keep steri-strips dry until they fall off. - Scars remodel for a full year. -  Once steri-strips fall off, patient can apply over-the-counter silicone scar cream each night to help with scar remodeling if desired. - Patient advised to call with any concerns or if they notice any new or changing lesions.   Rosacea Head - Anterior (Face) Discussed the  treatment option of BBL/laser.  Typically we recommend 1-3 treatment sessions about 5-8 weeks apart for best results.  The patient's condition may require "maintenance treatments" in the future.  The fee for BBL / laser treatments is $350 per treatment session for the whole face.  A fee can be quoted for other parts of the body. Insurance typically does not pay for BBL/laser treatments and therefore the fee is an out-of-pocket cost.   Lentigines - Scattered tan macules - Due to sun exposure - Benign-appearing, observe - Recommend daily broad spectrum sunscreen SPF 30+ to sun-exposed areas, reapply every 2 hours as needed. - Call for any changes  Seborrheic Keratoses - Stuck-on, waxy, tan-brown papules and/or plaques  - Benign-appearing - Discussed benign etiology and prognosis. - Observe - Call for any changes  Melanocytic Nevi - Tan-brown and/or pink-flesh-colored symmetric macules and papules - Benign appearing on exam today - Observation - Call clinic for new or changing moles - Recommend daily use of broad spectrum spf 30+ sunscreen to sun-exposed areas.   Hemangiomas - Red papules - Discussed benign nature - Observe - Call for any changes  Actinic Damage - Chronic condition, secondary to cumulative UV/sun exposure - diffuse scaly erythematous macules with underlying dyspigmentation - Recommend daily broad spectrum sunscreen SPF 30+ to sun-exposed areas, reapply every 2 hours as needed.  - Staying in the shade or wearing long sleeves, sun glasses (UVA+UVB protection) and wide brim hats (4-inch brim around the entire circumference of the hat) are also recommended for sun protection.  - Call for new or changing lesions.  Varicose Veins/Spider Veins - Dilated blue, purple or red veins at the lower extremities - Reassured - Smaller vessels can be treated by sclerotherapy (a procedure to inject a medicine into the veins to make them disappear) if desired, but the treatment is  not covered by insurance. Larger vessels may be covered if symptomatic and we would refer to vascular surgeon if treatment desired.   History of Basal Cell Carcinoma of the Skin Below left brow 2019 - No evidence of recurrence today - Recommend regular full body skin exams - Recommend daily broad spectrum sunscreen SPF 30+ to sun-exposed areas, reapply every 2 hours as needed.  - Call if any new or changing lesions are noted between office visits   Skin cancer screening performed today.   Return in about 1 year (around 03/05/2023) for TBSE, hx of BCC, schedule  BBL for Rosacea .  IMarye Round, CMA, am acting as scribe for Sarina Ser, MD .  Documentation: I have reviewed the above documentation for accuracy and completeness, and I agree with the above.  Sarina Ser, MD

## 2022-03-04 NOTE — Patient Instructions (Signed)

## 2022-03-08 ENCOUNTER — Encounter: Payer: Self-pay | Admitting: Dermatology

## 2022-03-10 ENCOUNTER — Encounter: Payer: Self-pay | Admitting: Dermatology

## 2022-03-11 ENCOUNTER — Ambulatory Visit (INDEPENDENT_AMBULATORY_CARE_PROVIDER_SITE_OTHER): Payer: Medicare Other | Admitting: Unknown Physician Specialty

## 2022-03-11 ENCOUNTER — Encounter: Payer: Self-pay | Admitting: Unknown Physician Specialty

## 2022-03-11 ENCOUNTER — Ambulatory Visit: Payer: Self-pay | Admitting: *Deleted

## 2022-03-11 VITALS — BP 137/76 | HR 70 | Temp 98.3°F | Wt 157.8 lb

## 2022-03-11 DIAGNOSIS — R1030 Lower abdominal pain, unspecified: Secondary | ICD-10-CM

## 2022-03-11 NOTE — Telephone Encounter (Signed)
Chief Complaint: abdominal pain , pressure urinating Symptoms: dull ache bloated, bad gas, low abdominal pain comes and goes can eat and drink  Frequency: started Sunday night  Pertinent Negatives: Patient denies fever, no N/V.  Disposition: '[]'$ ED /'[]'$ Urgent Care (no appt availability in office) / '[x]'$ Appointment(In office/virtual)/ '[]'$  [facility]/ '[]'$ Home Care/ '[]'$ Refused Recommended Disposition /'[]'$ [place] [facility]/ '[]'$  Follow-up with PCP Additional Notes:   Appt today    Reason for Disposition  [1] MILD pain (e.g., does not interfere with normal activities) AND [2] pain comes and goes (cramps) AND [3] present > 48 hours  (Exception: this same abdominal pain is a chronic symptom recurrent or ongoing AND present > 4 weeks)  Answer Assessment - Initial Assessment Questions 1. LOCATION: "Where does it hurt?"      Low abdominal area 2. RADIATION: "Does the pain shoot anywhere else?" (e.g., chest, back)     Low abdomen 3. ONSET: "When did the pain begin?" (e.g., minutes, hours or days ago)      Sunday night  4. SUDDEN: "Gradual or sudden onset?"     Na  5. PATTERN "Does the pain come and go, or is it constant?"    - If constant: "Is it getting better, staying the same, or worsening?"      (Note: Constant means the pain never goes away completely; most serious pain is constant and it progresses)     - If intermittent: "How long does it last?" "Do you have pain now?"     (Note: Intermittent means the pain goes away completely between bouts)     Pressure comes and goes with urinating 6. SEVERITY: "How bad is the pain?"  (e.g., Scale 1-10; mild, moderate, or severe)   - MILD (1-3): doesn't interfere with normal activities, abdomen soft and not tender to touch    - MODERATE (4-7): interferes with normal activities or awakens from sleep, abdomen tender to touch    - SEVERE (8-10): excruciating pain, doubled over, unable to do any normal activities      Mild now  7. RECURRENT  SYMPTOM: "Have you ever had this type of stomach pain before?" If Yes, ask: "When was the last time?" and "What happened that time?"      Na  8. CAUSE: "What do you think is causing the stomach pain?"     Not sure  9. RELIEVING/AGGRAVATING FACTORS: "What makes it better or worse?" (e.g., movement, antacids, bowel movement)     Na  10. OTHER SYMPTOMS: "Do you have any other symptoms?" (e.g., back pain, diarrhea, fever, urination pain, vomiting)       Abdominal pain dull ache pressure urinating  11. PREGNANCY: "Is there any chance you are pregnant?" "When was your last menstrual period?"       na  Answer Assessment - Initial Assessment Questions 1. SEVERITY: "How bad is the pain?"  (e.g., Scale 1-10; mild, moderate, or severe)   - MILD (1-3): complains slightly about urination hurting   - MODERATE (4-7): interferes with normal activities     - SEVERE (8-10): excruciating, unwilling or unable to urinate because of the pain      Mild  2. FREQUENCY: "How many times have you had painful urination today?"      *No Answer* 3. PATTERN: "Is pain present every time you urinate or just sometimes?"      *No Answer* 4. ONSET: "When did the painful urination start?"      *No Answer* 5. FEVER: "Do  you have a fever?" If Yes, ask: "What is your temperature, how was it measured, and when did it start?"     *No Answer* 6. PAST UTI: "Have you had a urine infection before?" If Yes, ask: "When was the last time?" and "What happened that time?"      *No Answer* 7. CAUSE: "What do you think is causing the painful urination?"  (e.g., UTI, scratch, Herpes sore)     na 8. OTHER SYMPTOMS: "Do you have any other symptoms?" (e.g., flank pain, vaginal discharge, genital sores, urgency, blood in urine)     Low abdomen urine concentrated and increased fluid intake  9. PREGNANCY: "Is there any chance you are pregnant?" "When was your last menstrual period?"     na  Protocols used: Urination Pain - Female-A-AH,  Abdominal Pain - [facility]

## 2022-03-11 NOTE — Progress Notes (Signed)
BP 137/76   Pulse 70   Temp 98.3 F (36.8 C) (Oral)   Wt 157 lb 12.8 oz (71.6 kg)   SpO2 97%   BMI 27.07 kg/m    Subjective:    Patient ID: Mary Galvan, female    DOB: May 18, 1951, 71 y.o.   MRN: [id number]  HPI: Mary Galvan is a 71 y.o. female  Chief Complaint  Patient presents with   Abdominal Pain    Lower abdomen and low back. Onset Sunday night. Denies  vomiting, diarrhea. Some nausea d/t pain. Pt reports having pressure when urinating. Denies dysuria.    Abdominal Pain This is a new (Chills and low grade fever 99.6) problem. Episode onset: 2 days ago after eating chili. The onset quality is sudden. The most recent episode lasted 2 days. The problem has been gradually improving. The pain is located in the suprapubic region. The quality of the pain is aching and sharp. The abdominal pain does not radiate. Associated symptoms include constipation, a fever, flatus and nausea. Pertinent negatives include no arthralgias, belching, diarrhea, dysuria, headaches, hematuria, myalgias, vomiting or weight loss. The pain is aggravated by movement. The pain is relieved by Nothing. She has tried nothing for the symptoms.   Relevant past medical, surgical, family and social history reviewed and updated as indicated. Interim medical history since our last visit reviewed. Allergies and medications reviewed and updated.  Review of Systems  Constitutional:  Positive for fever. Negative for weight loss.  Gastrointestinal:  Positive for abdominal pain, constipation, flatus and nausea. Negative for diarrhea and vomiting.  Genitourinary:  Negative for dysuria and hematuria.  Musculoskeletal:  Negative for arthralgias and myalgias.  Neurological:  Negative for headaches.   Per HPI unless specifically indicated above     Objective:    BP 137/76   Pulse 70   Temp 98.3 F (36.8 C) (Oral)   Wt 157 lb 12.8 oz (71.6 kg)   SpO2 97%   BMI 27.07 kg/m   Wt Readings from Last 3  Encounters:  03/11/22 157 lb 12.8 oz (71.6 kg)  12/10/21 149 lb 12.8 oz (67.9 kg)  11/05/21 146 lb (66.2 kg)    Physical Exam Constitutional:      General: She is not in acute distress.    Appearance: Normal appearance. She is well-developed.  HENT:     Head: Normocephalic and atraumatic.  Eyes:     General: Lids are normal. No scleral icterus.       Right eye: No discharge.        Left eye: No discharge.     Conjunctiva/sclera: Conjunctivae normal.  Neck:     Vascular: No carotid bruit or JVD.  Cardiovascular:     Rate and Rhythm: Normal rate and regular rhythm.     Heart sounds: Normal heart sounds.  Pulmonary:     Effort: Pulmonary effort is normal. No respiratory distress.     Breath sounds: Normal breath sounds.  Abdominal:     General: Bowel sounds are normal.     Palpations: There is no hepatomegaly or splenomegaly.     Tenderness: There is abdominal tenderness in the right lower quadrant. There is no right CVA tenderness, left CVA tenderness, guarding or rebound.  Musculoskeletal:        General: Normal range of motion.     Cervical back: Normal range of motion and neck supple.  Skin:    General: Skin is warm and dry.     Coloration:  Skin is not pale.     Findings: No rash.  Neurological:     Mental Status: She is alert and oriented to person, place, and time.  Psychiatric:        Behavior: Behavior normal.        Thought Content: Thought content normal.        Judgment: Judgment normal.    Results for orders placed or performed in visit on 12/10/21  Comprehensive metabolic panel  Result Value Ref Range   Glucose 87 70 - 99 mg/dL   BUN 24 8 - 27 mg/dL   Creatinine, Ser 0.58 0.57 - 1.00 mg/dL   eGFR 97 >59 mL/min/1.73   BUN/Creatinine Ratio 41 (H) 12 - 28   Sodium 140 134 - 144 mmol/L   Potassium 4.0 3.5 - 5.2 mmol/L   Chloride 99 96 - 106 mmol/L   CO2 26 20 - 29 mmol/L   Calcium 9.8 8.7 - 10.3 mg/dL   Total Protein 7.0 6.0 - 8.5 g/dL   Albumin 4.3 3.7  - 4.7 g/dL   Globulin, Total 2.7 1.5 - 4.5 g/dL   Albumin/Globulin Ratio 1.6 1.2 - 2.2   Bilirubin Total 0.5 0.0 - 1.2 mg/dL   Alkaline Phosphatase 85 44 - 121 IU/L   AST 20 0 - 40 IU/L   ALT 23 0 - 32 IU/L  Lipid Panel w/o Chol/HDL Ratio  Result Value Ref Range   Cholesterol, Total 254 (H) 100 - 199 mg/dL   Triglycerides 128 0 - 149 mg/dL   HDL 69 >39 mg/dL   VLDL Cholesterol Cal 23 5 - 40 mg/dL   LDL Chol Calc (NIH) 162 (H) 0 - 99 mg/dL      Assessment & Plan:   Problem List Items Addressed This Visit   None Visit Diagnoses     Lower abdominal pain    -  Primary   Unable to do check in-house labs.  Will send out a CBC and urinalysis.  Symptoms consistent with diverticulitis.Clear liquid diet with gradual increase as toler   Relevant Orders   CBC with Differential/Platelet   Urinalysis, Routine w reflex microscopic       Already improving with less pain and no chills in the last 2 days.  Discussed pt and plan of care with Dr. Wynetta Emery who agrees with supportive care  Follow up plan: Return if symptoms worsen or fail to improve.

## 2022-03-12 LAB — URINALYSIS, ROUTINE W REFLEX MICROSCOPIC
Bilirubin, UA: NEGATIVE
Glucose, UA: NEGATIVE
Ketones, UA: NEGATIVE
Leukocytes,UA: NEGATIVE
Nitrite, UA: NEGATIVE
Protein,UA: NEGATIVE
RBC, UA: NEGATIVE
Specific Gravity, UA: 1.012 (ref 1.005–1.030)
Urobilinogen, Ur: 0.2 mg/dL (ref 0.2–1.0)
pH, UA: 6 (ref 5.0–7.5)

## 2022-03-12 LAB — CBC WITH DIFFERENTIAL/PLATELET
Basophils Absolute: 0 10*3/uL (ref 0.0–0.2)
Basos: 0 %
EOS (ABSOLUTE): 0.1 10*3/uL (ref 0.0–0.4)
Eos: 1 %
Hematocrit: 44.6 % (ref 34.0–46.6)
Hemoglobin: 14.7 g/dL (ref 11.1–15.9)
Immature Grans (Abs): 0 10*3/uL (ref 0.0–0.1)
Immature Granulocytes: 0 %
Lymphocytes Absolute: 2.9 10*3/uL (ref 0.7–3.1)
Lymphs: 33 %
MCH: 30.6 pg (ref 26.6–33.0)
MCHC: 33 g/dL (ref 31.5–35.7)
MCV: 93 fL (ref 79–97)
Monocytes Absolute: 0.6 10*3/uL (ref 0.1–0.9)
Monocytes: 7 %
Neutrophils Absolute: 5.1 10*3/uL (ref 1.4–7.0)
Neutrophils: 59 %
Platelets: 221 10*3/uL (ref 150–450)
RBC: 4.8 x10E6/uL (ref 3.77–5.28)
RDW: 12.1 % (ref 11.7–15.4)
WBC: 8.7 10*3/uL (ref 3.4–10.8)

## 2022-03-31 ENCOUNTER — Encounter: Payer: Medicare Other | Admitting: Dermatology

## 2022-04-07 ENCOUNTER — Encounter: Payer: Medicare Other | Admitting: Dermatology

## 2022-04-21 ENCOUNTER — Encounter: Payer: Self-pay | Admitting: Ophthalmology

## 2022-04-30 NOTE — Discharge Instructions (Signed)

## 2022-05-02 ENCOUNTER — Encounter
Admission: RE | Disposition: A | Discharge status: Home / Self Care | Payer: Self-pay | Source: Home / Self Care | Attending: Ophthalmology

## 2022-05-02 ENCOUNTER — Other Ambulatory Visit: Payer: Self-pay

## 2022-05-02 ENCOUNTER — Ambulatory Visit
Admission: RE | Admit: 2022-05-02 | Discharge: 2022-05-02 | Disposition: A | Discharge status: Home / Self Care | Payer: Medicare Other | Attending: Ophthalmology | Admitting: Ophthalmology

## 2022-05-02 ENCOUNTER — Ambulatory Visit: Payer: Medicare Other | Admitting: Anesthesiology

## 2022-05-02 ENCOUNTER — Encounter: Payer: Self-pay | Admitting: Ophthalmology

## 2022-05-02 DIAGNOSIS — H02834 Dermatochalasis of left upper eyelid: Secondary | ICD-10-CM | POA: Diagnosis not present

## 2022-05-02 DIAGNOSIS — H02831 Dermatochalasis of right upper eyelid: Secondary | ICD-10-CM | POA: Insufficient documentation

## 2022-05-02 DIAGNOSIS — Z87891 Personal history of nicotine dependence: Secondary | ICD-10-CM | POA: Insufficient documentation

## 2022-05-02 DIAGNOSIS — H57813 Brow ptosis, bilateral: Secondary | ICD-10-CM | POA: Insufficient documentation

## 2022-05-02 DIAGNOSIS — L574 Cutis laxa senilis: Secondary | ICD-10-CM | POA: Diagnosis not present

## 2022-05-02 HISTORY — PX: BROW LIFT: SHX178

## 2022-05-02 SURGERY — BLEPHAROPLASTY
Anesthesia: General | Site: Eye | Laterality: Bilateral

## 2022-05-02 MED ORDER — OXYCODONE HCL 5 MG PO TABS: 5.0000 mg | ORAL_TABLET | Freq: Once | ORAL | Status: DC | PRN

## 2022-05-02 MED ORDER — PROPOFOL 500 MG/50ML IV EMUL
INTRAVENOUS | Status: DC | PRN
  Administered 2022-05-02: 20 mg via INTRAVENOUS
  Administered 2022-05-02: 25 ug/kg/min via INTRAVENOUS

## 2022-05-02 MED ORDER — TETRACAINE HCL 0.5 % OP SOLN
OPHTHALMIC | Status: DC | PRN
  Administered 2022-05-02: 1 [drp] via OPHTHALMIC

## 2022-05-02 MED ORDER — FENTANYL CITRATE (PF) 100 MCG/2ML IJ SOLN
INTRAMUSCULAR | Status: DC | PRN
  Administered 2022-05-02 (×2): 25 ug via INTRAVENOUS
  Administered 2022-05-02: 50 ug via INTRAVENOUS

## 2022-05-02 MED ORDER — ERYTHROMYCIN 5 MG/GM OP OINT
TOPICAL_OINTMENT | OPHTHALMIC | Status: DC | PRN
  Administered 2022-05-02: 1 via OPHTHALMIC

## 2022-05-02 MED ORDER — ERYTHROMYCIN 5 MG/GM OP OINT: TOPICAL_OINTMENT | OPHTHALMIC | 2 refills | Status: DC

## 2022-05-02 MED ORDER — BSS IO SOLN
INTRAOCULAR | Status: DC | PRN
  Administered 2022-05-02: 15 mL

## 2022-05-02 MED ORDER — ONDANSETRON HCL 4 MG/2ML IJ SOLN
INTRAMUSCULAR | Status: DC | PRN
  Administered 2022-05-02: 4 mg via INTRAVENOUS

## 2022-05-02 MED ORDER — OXYCODONE HCL 5 MG/5ML PO SOLN: 5.0000 mg | Freq: Once | ORAL | Status: DC | PRN

## 2022-05-02 MED ORDER — LIDOCAINE HCL (CARDIAC) PF 100 MG/5ML IV SOSY
PREFILLED_SYRINGE | INTRAVENOUS | Status: DC | PRN
  Administered 2022-05-02: 20 mg via INTRAVENOUS

## 2022-05-02 MED ORDER — MIDAZOLAM HCL 2 MG/2ML IJ SOLN
INTRAMUSCULAR | Status: DC | PRN
  Administered 2022-05-02: 2 mg via INTRAVENOUS

## 2022-05-02 MED ORDER — FENTANYL CITRATE PF 50 MCG/ML IJ SOSY: 25.0000 ug | PREFILLED_SYRINGE | INTRAMUSCULAR | Status: DC | PRN

## 2022-05-02 MED ORDER — TRAMADOL HCL 50 MG PO TABS: ORAL_TABLET | ORAL | 0 refills | Status: DC

## 2022-05-02 MED ORDER — LIDOCAINE-EPINEPHRINE 2 %-1:100000 IJ SOLN
INTRAMUSCULAR | Status: DC | PRN
  Administered 2022-05-02: 6.5 mL via OPHTHALMIC
  Administered 2022-05-02: 2 mL via OPHTHALMIC

## 2022-05-02 MED ORDER — LACTATED RINGERS IV SOLN: INTRAVENOUS | Status: DC

## 2022-05-02 SURGICAL SUPPLY — 24 items
APPLICATOR COTTON TIP WD 3 STR (MISCELLANEOUS) ×2 IMPLANT
BLADE SURG 15 STRL LF DISP TIS (BLADE) ×1 IMPLANT
BLADE SURG 15 STRL SS (BLADE) ×2
CORD BIP STRL DISP 12FT (MISCELLANEOUS) ×2 IMPLANT
GAUZE SPONGE 4X4 12PLY STRL (GAUZE/BANDAGES/DRESSINGS) ×2 IMPLANT
GLOVE SURG UNDER POLY LF SZ7 (GLOVE) ×4 IMPLANT
GOWN STRL REUS W/ TWL LRG LVL3 (GOWN DISPOSABLE) ×1 IMPLANT
GOWN STRL REUS W/TWL LRG LVL3 (GOWN DISPOSABLE) ×2
MARKER SKIN XFINE TIP W/RULER (MISCELLANEOUS) ×2 IMPLANT
NDL FILTER BLUNT 18X1 1/2 (NEEDLE) ×1 IMPLANT
NDL HYPO 30X.5 LL (NEEDLE) ×2 IMPLANT
NEEDLE FILTER BLUNT 18X 1/2SAF (NEEDLE) ×1
NEEDLE FILTER BLUNT 18X1 1/2 (NEEDLE) ×1 IMPLANT
NEEDLE HYPO 30X.5 LL (NEEDLE) ×4 IMPLANT
PACK ENT CUSTOM (PACKS) ×2 IMPLANT
SOL PREP PVP 2OZ (MISCELLANEOUS) ×2
SOLUTION PREP PVP 2OZ (MISCELLANEOUS) ×1 IMPLANT
SPONGE GAUZE 2X2 8PLY STRL LF (GAUZE/BANDAGES/DRESSINGS) ×20 IMPLANT
SUT CHROMIC 5 0 P 3 (SUTURE) ×2 IMPLANT
SUT GUT PLAIN 6-0 1X18 ABS (SUTURE) ×3 IMPLANT
SUT PROLENE 5 0 P 3 (SUTURE) ×1 IMPLANT
SYR 10ML LL (SYRINGE) ×2 IMPLANT
SYR 3ML LL SCALE MARK (SYRINGE) ×2 IMPLANT
WATER STERILE IRR 250ML POUR (IV SOLUTION) ×2 IMPLANT

## 2022-05-02 NOTE — Op Note (Addendum)
Preoperative Diagnosis:   1.  Visually significant bilateral brow ptosis.  2.  Visually significant dermatochalasis bilateral Upper Eyelid(s)  Postoperative Diagnosis: Same.   Procedure(s) Performed:   1. bilateral  Direct brow lift to improve vision. 26378 2.  Upper eyelid blepharoplasty with excess skin excision bilateral  Upper Eyelid(s) 58850  Surgeon: Philis Pique. Vickki Muff, M.D.   Assistants: None   Anesthesia: MAC  Specimens: None.  Estimated Blood Loss: Minimal.  Complications: None.  Operative Findings: None Dictated  PROCEDURE:   Allergies were reviewed and the patient has No Known Allergies..   After the risks, benefits, complications and alternatives were discussed with the patient, appropriate informed consent was obtained. While seated in an upright position and looking in primary gaze, the amount of supra-brow skin to be removed was measured and marked in an elliptical pattern. The patient was then brought to the operating suite and reclined supine.  Timeout was conducted and the patient was sedated. Local anesthetic consisting of a 50-50 mixture of 2% lidocaine with epinephrine and 0.75% bupivacaine with added Hylenex was injected subcutaneously to the both  brow region(s) and down to the periosteum and  subcutaneously to both  upper eyelid(s). After adequate local was instilled, the patient was prepped and draped in the usual sterile fashion for eyelid surgery.   Attention was turned to the right brow region. A #15 blade was used to create a bevelled incision along the premarked incision line. A skin and subcutaneous tissue flap was then excised and hemostasis was obtained with bipolar cautery. The deep tissues were reapproximated with interrupted vertical 5-0 chromic sutures. The skin margin was reapproximated with a running locking 5-0 Prolene suture. Attention was then turned to the opposite brow region where the same procedure was performed in the same manner.     Attention was then turned to the upper eyelids. A 42m upper eyelid crease incision line was marked with calipers on both  upper eyelid(s).  A pinch test was used to estimate the amount of excess skin to remove and this was marked in standard blepharoplasty style fashion. Attention was turned to the right  upper eyelid. A #15 blade was used to open the premarked incision line. A Skin only flap was excised and hemostasis was obtained with bipolar cautery. A buttonhole was created medially in orbicularis and orbital septum to reveal the medial fat pocket. This was dissected free from fascial attachments, cauterized towards the pedicle base and excised to produce a nice flattening of the medial corner of the upper eyelid.  Attention was then turned to the opposite eyelid where the same procedure was performed in the same manner.   The skin incisions were closed with a combination of interrupted and  running 6-0 fast absorbing plain gut suture.   The patient tolerated the procedure well. Erythromycin ophthalmic ointment was applied to the incision site(s) followed by ice packs.The patient was taken to the recovery area where she recovered without difficulty.  Post-Op Plan/Instructions:  The patient was instructed to use ice packs frequently for the next 48 hours.  she was instructed to use Erythromycin ophthalmic ophthalmic ointment on the eyelid incisions and over-the-counter antibiotic ointment on the brow sutures 4 times a day for the next 12 to 14 days. she was given a prescription for Tramadol (or similar) for pain control should Tylenol not be effective. she was asked to to follow up in 10-12 days time for suture removal or sooner as needed for problems.   Mary Galvan M.  Vickki Muff, M.D. Ophthalmology

## 2022-05-02 NOTE — Transfer of Care (Signed)
Immediate Anesthesia Transfer of Care Note  Patient: Mary Galvan  Procedure(s) Performed: BLEPHAROPLASTY UPPER EYELID; W/EXCESS SKIN BROW PTOSIS REPAIR BILATERAL (Bilateral: Eye)  Patient Location: PACU  Anesthesia Type: General  Level of Consciousness: awake, alert  and patient cooperative  Airway and Oxygen Therapy: Patient Spontanous Breathing and Patient connected to supplemental oxygen  Post-op Assessment: Post-op Vital signs reviewed, Patient's Cardiovascular Status Stable, Respiratory Function Stable, Patent Airway and No signs of Nausea or vomiting  Post-op Vital Signs: Reviewed and stable  Complications: No notable events documented.

## 2022-05-02 NOTE — Anesthesia Postprocedure Evaluation (Signed)
Anesthesia Post Note  Patient: Mary Galvan  Procedure(s) Performed: BLEPHAROPLASTY UPPER EYELID; W/EXCESS SKIN BROW PTOSIS REPAIR BILATERAL (Bilateral: Eye)     Patient location during evaluation: PACU Anesthesia Type: General Level of consciousness: awake and alert Pain management: pain level controlled Vital Signs Assessment: post-procedure vital signs reviewed and stable Respiratory status: spontaneous breathing, nonlabored ventilation, respiratory function stable and patient connected to nasal cannula oxygen Cardiovascular status: blood pressure returned to baseline and stable Postop Assessment: no apparent nausea or vomiting Anesthetic complications: no   No notable events documented.  Adele Barthel Yovanni Frenette

## 2022-05-02 NOTE — Anesthesia Preprocedure Evaluation (Signed)
Anesthesia Evaluation  Patient identified by MRN, date of birth, ID band Patient awake    History of Anesthesia Complications (+) PONV and history of anesthetic complications  Airway Mallampati: II  TM Distance: >3 FB Neck ROM: Full    Dental no notable dental hx.    Pulmonary neg pulmonary ROS, former smoker,    Pulmonary exam normal        Cardiovascular Exercise Tolerance: Good negative cardio ROS Normal cardiovascular exam     Neuro/Psych negative psych ROS   GI/Hepatic negative GI ROS, Neg liver ROS,   Endo/Other  negative endocrine ROS  Renal/GU negative Renal ROS     Musculoskeletal   Abdominal   Peds  Hematology negative hematology ROS (+)   Anesthesia Other Findings   Reproductive/Obstetrics                             Anesthesia Physical Anesthesia Plan  ASA: 1  Anesthesia Plan: General   Post-op Pain Management:    Induction:   PONV Risk Score and Plan: 3 and Ondansetron, TIVA, Midazolam, Propofol infusion and Treatment may vary due to age or medical condition  Airway Management Planned: Nasal Cannula and Natural Airway  Additional Equipment: None  Intra-op Plan:   Post-operative Plan:   Informed Consent: I have reviewed the patients History and Physical, chart, labs and discussed the procedure including the risks, benefits and alternatives for the proposed anesthesia with the patient or authorized representative who has indicated his/her understanding and acceptance.       Plan Discussed with: CRNA  Anesthesia Plan Comments:         Anesthesia Quick Evaluation

## 2022-05-02 NOTE — Interval H&P Note (Signed)
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  The various methods of treatment have been discussed with the patient and family. After consideration of risks, benefits and other options for treatment, the patient has consented to  Procedure(s): BLEPHAROPLASTY UPPER EYELID; W/EXCESS SKIN BROW PTOSIS REPAIR BILATERAL (Bilateral) as a surgical intervention.  The patient's history has been reviewed, patient examined, no change in status, stable for surgery.  I have reviewed the patient's chart and labs.  Questions were answered to the patient's satisfaction.     Vickki Muff, Yovan Leeman M

## 2022-05-02 NOTE — H&P (Signed)
[place]: [facility]  Primary Care Physician:  Valerie Roys, DO Ophthalmologist: Dr. Philis Pique. Vickki Muff, M.D.  Pre-Procedure History & Physical: HPI:  Mary Galvan is a 71 y.o. female here for periocular surgery.   Past Medical History:  Diagnosis Date   Actinic keratosis    Anxiety    Arthritis    thumbs   Basal cell carcinoma 10/20/2017   just under left brow   Depression    Dysplastic nevus 01/19/2018   right distal dorsum foot   Family history of adverse reaction to anesthesia    Fatty liver disease, nonalcoholic    GERD (gastroesophageal reflux disease)    Heart murmur    Hyperlipidemia    Hypertension    Kidney cysts    Motion sickness    reading in cars   Ocular migraine    PONV (postoperative nausea and vomiting)    slow to wake   Rosacea    Seasonal affective disorder (HCC)    Seasonal allergies    Seizures (HCC)    x1 - after head trauma. 1970's   Vitreous detachment of left eye 04/2011   [facility]    Past Surgical History:  Procedure Laterality Date   BASAL CELL CARCINOMA EXCISION Left    removed from above left eye    CHOLECYSTECTOMY  2015   COLONOSCOPY WITH PROPOFOL N/A 05/10/2015   Procedure: COLONOSCOPY WITH PROPOFOL;  Surgeon: Lucilla Lame, MD;  Location: [place];  Service: Endoscopy;  Laterality: N/A;  WITH BIOPSY-- SIGMOID COLON POLYP  X  4 DESCENDING COLON POLYP   COLONOSCOPY WITH PROPOFOL N/A 11/05/2021   Procedure: COLONOSCOPY WITH PROPOFOL;  Surgeon: Lin Landsman, MD;  Location: [facility] [place] ENDOSCOPY;  Service: Gastroenterology;  Laterality: N/A;   DILATATION & CURETTAGE/HYSTEROSCOPY WITH MYOSURE N/A 03/28/2016   Procedure: DILATATION & CURETTAGE/HYSTEROSCOPY WITH MYOSURE;  Surgeon: Boykin Nearing, MD;  Location: [facility];  Service: Gynecology;  Laterality: N/A;   ESOPHAGOGASTRODUODENOSCOPY (EGD) WITH PROPOFOL N/A 09/02/2017   Procedure: ESOPHAGOGASTRODUODENOSCOPY (EGD) WITH PROPOFOL;  Surgeon:  Lin Landsman, MD;  Location: [place];  Service: Endoscopy;  Laterality: N/A;   MELANOMA EXCISION     pre melenoma excision on right foot    OTHER SURGICAL HISTORY     Uterine Polyp    Prior to Admission medications   Medication Sig Start Date End Date Taking? Authorizing Provider  Cholecalciferol (VITAMIN D3) 2000 UNITS TABS Take 2,000 Units by mouth daily.    Yes [provider]  Multiple Vitamins-Minerals (PRESERVISION AREDS 2 PO) Take 1 tablet by mouth 2 (two) times daily.   Yes [provider]  Magnesium Chloride POWD Take 325 mg by mouth daily. Patient not taking: Reported on 04/21/2022    [provider]  mupirocin ointment (BACTROBAN) 2 % Apply 1 application. topically daily. With dressing changes Patient not taking: Reported on 03/11/2022 02/25/22   Ralene Bathe, MD  Turmeric 500 MG TABS turmeric (bulk) Patient not taking: Reported on 04/21/2022    [provider]    Allergies as of 12/27/2021   (No Known Allergies)    Family History  Problem Relation Age of Onset   Cancer Mother        possible lung?   Dementia Mother    Cancer Sister        kidney   Diabetes Sister    COPD Sister    Cancer Paternal Grandfather        stomach  and prostate   Diabetes Sister    Heart disease Sister    Hypertension Sister    COPD Sister    Stroke Maternal Uncle    Breast cancer Neg Hx     Social History   Socioeconomic History   Marital status: Married    Spouse name: Not on file   Number of children: Not on file   Years of education: Not on file   Highest education level: High school graduate  Occupational History   Occupation: retired  Tobacco Use   Smoking status: Former    Packs/day: 1.00    Years: 30.00    Total pack years: 30.00    Types: Cigarettes    Quit date: 03/13/2000    Years since quitting: 22.1   Smokeless tobacco: Never  Vaping Use   Vaping Use: Never used  Substance and Sexual Activity    Alcohol use: Yes    Comment: occasional   Drug use: No   Sexual activity: Not Currently    Partners: Male  Other Topics Concern   Not on file  Social History Narrative   Not on file   Social Determinants of Health   Financial Resource Strain: Low Risk  (10/10/2021)   Overall Financial Resource Strain (CARDIA)    Difficulty of Paying Living Expenses: Not hard at all  Food Insecurity: No Food Insecurity (10/10/2021)   Hunger Vital Sign    Worried About Running Out of Food in the Last Year: Never true    [place] in the Last Year: Never true  Transportation Needs: No Transportation Needs (10/10/2021)   PRAPARE - Hydrologist (Medical): No    Lack of Transportation (Non-Medical): No  Physical Activity: Sufficiently Active (10/10/2021)   Exercise Vital Sign    Days of Exercise per Week: 4 days    Minutes of Exercise per Session: 40 min  Stress: No Stress Concern Present (10/10/2021)   [place]    Feeling of Stress : Not at all  Social Connections: Moderately Isolated (10/10/2021)   Social Connection and Isolation Panel [NHANES]    Frequency of Communication with Friends and Family: More than three times a week    Frequency of Social Gatherings with Friends and Family: More than three times a week    Attends Religious Services: Never    Marine scientist or Organizations: No    Attends Archivist Meetings: Never    Marital Status: Married  Human resources officer Violence: Not At Risk (10/10/2021)   Humiliation, Afraid, Rape, and Kick questionnaire    Fear of Current or Ex-Partner: No    Emotionally Abused: No    Physically Abused: No    Sexually Abused: No    Review of Systems: See HPI, otherwise negative ROS  Physical Exam: BP (!) 152/84   Pulse 78   Temp 97.8 F (36.6 C) (Temporal)   Resp (!) 22   Ht '5\' 4"'$  (1.626 m)   Wt 72.1 kg   SpO2 97%   BMI 27.29  kg/m  General:   Alert and cooperative in NAD Head:  Normocephalic and atraumatic. Respiratory:  Normal work of breathing.  Impression/Plan: Mary Galvan is here for periocular surgery.  Risks, benefits, limitations, and alternatives regarding surgery have been reviewed with the patient.  Questions have been answered.  All parties agreeable.   Karle Starch, MD  05/02/2022, 10:05 AM

## 2022-06-05 DIAGNOSIS — H353132 Nonexudative age-related macular degeneration, bilateral, intermediate dry stage: Secondary | ICD-10-CM | POA: Diagnosis not present

## 2022-07-03 ENCOUNTER — Ambulatory Visit (INDEPENDENT_AMBULATORY_CARE_PROVIDER_SITE_OTHER): Payer: Self-pay | Admitting: Dermatology

## 2022-07-03 DIAGNOSIS — L988 Other specified disorders of the skin and subcutaneous tissue: Secondary | ICD-10-CM

## 2022-07-03 DIAGNOSIS — I781 Nevus, non-neoplastic: Secondary | ICD-10-CM

## 2022-07-03 NOTE — Patient Instructions (Signed)
Due to recent changes in healthcare laws, you may see results of your pathology and/or laboratory studies on MyChart before the doctors have had a chance to review them. We understand that in some cases there may be results that are confusing or concerning to you. Please understand that not all results are received at the same time and often the doctors may need to interpret multiple results in order to provide you with the best plan of care or course of treatment. Therefore, we ask that you please give us 2 business days to thoroughly review all your results before contacting the office for clarification. Should we see a critical lab result, you will be contacted sooner.   If You Need Anything After Your Visit  If you have any questions or concerns for your doctor, please call our main line at [phone] and press option 4 to reach your doctor's medical assistant. If no one answers, please leave a voicemail as directed and we will return your call as soon as possible. Messages left after 4 pm will be answered the following business day.   You may also send us a message via MyChart. We typically respond to MyChart messages within 1-2 business days.  For prescription refills, please ask your pharmacy to contact our office. Our fax number is [phone].  If you have an urgent issue when the clinic is closed that cannot wait until the next business day, you can page your doctor at the number below.    Please note that while we do our best to be available for urgent issues outside of office hours, we are not available 24/7.   If you have an urgent issue and are unable to reach us, you may choose to seek medical care at your doctor's office, retail clinic, urgent care center, or emergency room.  If you have a medical emergency, please immediately call 911 or go to the emergency department.  Pager Numbers  - Dr. Kowalski: [phone]  - Dr. Moye: [phone]  - Dr. Stewart:  [phone]  In the event of inclement weather, please call our main line at [phone] for an update on the status of any delays or closures.  Dermatology Medication Tips: Please keep the boxes that topical medications come in in order to help keep track of the instructions about where and how to use these. Pharmacies typically print the medication instructions only on the boxes and not directly on the medication tubes.   If your medication is too expensive, please contact our office at [phone] option 4 or send us a message through MyChart.   We are unable to tell what your co-pay for medications will be in advance as this is different depending on your insurance coverage. However, we may be able to find a substitute medication at lower cost or fill out paperwork to get insurance to cover a needed medication.   If a prior authorization is required to get your medication covered by your insurance company, please allow us 1-2 business days to complete this process.  Drug prices often vary depending on where the prescription is filled and some pharmacies may offer cheaper prices.  The website www.goodrx.com contains coupons for medications through different pharmacies. The prices here do not account for what the cost may be with help from insurance (it may be cheaper with your insurance), but the website can give you the price if you did not use any insurance.  - You can print the associated coupon and take it with   your prescription to the pharmacy.  - You may also stop by our office during regular business hours and pick up a GoodRx coupon card.  - If you need your prescription sent electronically to a different pharmacy, notify our office through [facility] MyChart or by phone at [phone] option 4.     Si Usted Necesita Algo Despus de Su Visita  Tambin puede enviarnos un mensaje a travs de MyChart. Por lo general respondemos a los mensajes de MyChart en el transcurso de 1 a 2  das hbiles.  Para renovar recetas, por favor pida a su farmacia que se ponga en contacto con nuestra oficina. Nuestro nmero de fax es el [phone].  Si tiene un asunto urgente cuando la clnica est cerrada y que no puede esperar hasta el siguiente da hbil, puede llamar/localizar a su doctor(a) al nmero que aparece a continuacin.   Por favor, tenga en cuenta que aunque hacemos todo lo posible para estar disponibles para asuntos urgentes fuera del horario de oficina, no estamos disponibles las 24 horas del da, los 7 das de la semana.   Si tiene un problema urgente y no puede comunicarse con nosotros, puede optar por buscar atencin mdica  en el consultorio de su doctor(a), en una clnica privada, en un centro de atencin urgente o en una sala de emergencias.  Si tiene una emergencia mdica, por favor llame inmediatamente al 911 o vaya a la sala de emergencias.  Nmeros de bper  - Dr. Kowalski: [phone]  - Dra. Moye: [phone]  - Dra. Stewart: [phone]  En caso de inclemencias del tiempo, por favor llame a nuestra lnea principal al [phone] para una actualizacin sobre el estado de cualquier retraso o cierre.  Consejos para la medicacin en dermatologa: Por favor, guarde las cajas en las que vienen los medicamentos de uso tpico para ayudarle a seguir las instrucciones sobre dnde y cmo usarlos. Las farmacias generalmente imprimen las instrucciones del medicamento slo en las cajas y no directamente en los tubos del medicamento.   Si su medicamento es muy caro, por favor, pngase en contacto con nuestra oficina llamando al [phone] y presione la opcin 4 o envenos un mensaje a travs de MyChart.   No podemos decirle cul ser su copago por los medicamentos por adelantado ya que esto es diferente dependiendo de la cobertura de su seguro. Sin embargo, es posible que podamos encontrar un medicamento sustituto a menor costo o llenar un formulario para que el  seguro cubra el medicamento que se considera necesario.   Si se requiere una autorizacin previa para que su compaa de seguros cubra su medicamento, por favor permtanos de 1 a 2 das hbiles para completar este proceso.  Los precios de los medicamentos varan con frecuencia dependiendo del lugar de dnde se surte la receta y alguna farmacias pueden ofrecer precios ms baratos.  El sitio web www.goodrx.com tiene cupones para medicamentos de diferentes farmacias. Los precios aqu no tienen en cuenta lo que podra costar con la ayuda del seguro (puede ser ms barato con su seguro), pero el sitio web puede darle el precio si no utiliz ningn seguro.  - Puede imprimir el cupn correspondiente y llevarlo con su receta a la farmacia.  - Tambin puede pasar por nuestra oficina durante el horario de atencin regular y recoger una tarjeta de cupones de GoodRx.  - Si necesita que su receta se enve electrnicamente a una farmacia diferente, informe a nuestra oficina a travs de MyChart de [place]   o por telfono llamando al [phone] y presione la opcin 4.  

## 2022-07-03 NOTE — Progress Notes (Unsigned)
   Follow-Up Visit   Subjective  Mary Galvan is a 71 y.o. female who presents for the following: Rosacea (Patient here for Bbl laser for Rosacea on her face.).  The following portions of the chart were reviewed this encounter and updated as appropriate:   Tobacco  Allergies  Meds  Problems  Med Hx  Surg Hx  Fam Hx     Review of Systems:  No other skin or systemic complaints except as noted in HPI or Assessment and Plan.  Objective  Well appearing patient in no apparent distress; mood and affect are within normal limits.  A focused examination was performed including face. Relevant physical exam findings are noted in the Assessment and Plan.  Head - Anterior (Face) Mid face erythema with telangiectasias           Assessment & Plan  Elastosis of skin Head - Anterior (Face)  Rosacea is a chronic progressive skin condition usually affecting the face of adults, causing redness and/or acne bumps. It is treatable but not curable. It sometimes affects the eyes (ocular rosacea) as well. It may respond to topical and/or systemic medication and can flare with stress, sun exposure, alcohol, exercise and some foods.  Daily application of broad spectrum spf 30+ sunscreen to face is recommended to reduce flares.   Discussed the treatment option of BBL/laser.  Typically we recommend 1-3 treatment sessions about 5-8 weeks apart for best results.  The patient's condition may require "maintenance treatments" in the future.  The fee for BBL / laser treatments is $350 per treatment session for the whole face.  A fee can be quoted for other parts of the body. Insurance typically does not pay for BBL/laser treatments and therefore the fee is an out-of-pocket cost.    Photorejuvenation - Head - Anterior (Face) Prior to the procedure, the patient's past medical history, medications, allergies, and the rare but potential risks and complications were reviewed with the patient and a signed  consent was obtained.  Pre and post treatment care was discussed and instructions provided.   Patient tolerated the procedure well.   Nancy Fetter avoidance was stressed. The patient will call with any problems, questions or concerns prior to their next appointment.    [place] BBL - 07/07/22 1700      Treatment Details   Date: 07/03/22    Treatment #: 1    Area: face    Filter: 1st Pass      1st Pass   Location: F    Device:   BBL j/cm2: 26    Pulse width : 27  Cooling Temp: 20    Pulses: 85    560 filter    Return for as scheduled in January for BBL.  IMarye Round, CMA, am acting as scribe for Sarina Ser, MD .  Documentation: I have reviewed the above documentation for accuracy and completeness, and I agree with the above.  Sarina Ser, MD

## 2022-07-08 ENCOUNTER — Encounter: Payer: Self-pay | Admitting: Dermatology

## 2022-09-02 ENCOUNTER — Ambulatory Visit (LOCAL_COMMUNITY_HEALTH_CENTER): Payer: Medicare Other

## 2022-09-02 DIAGNOSIS — Z7185 Encounter for immunization safety counseling: Secondary | ICD-10-CM

## 2022-09-02 DIAGNOSIS — Z23 Encounter for immunization: Secondary | ICD-10-CM

## 2022-09-02 NOTE — Progress Notes (Signed)
  Are you feeling sick today? No   Have you ever received a dose of COVID-19 Vaccine? [employer], [place], [place], [place], Other) Yes  If yes, which vaccine and how many doses?   Pfizer and 4 doses    Did you bring the vaccination record card or other documentation?  No   Do you have a health condition or are undergoing treatment that makes you moderately or severely immunocompromised? This would include, but not be limited to: cancer, HIV, organ transplant, immunosuppressive therapy/high-dose corticosteroids, or moderate/severe primary immunodeficiency.  No  Have you received COVID-19 vaccine before or during hematopoietic cell transplant (HCT) or CAR-T-cell therapies? No  Have you ever had an allergic reaction to: (This would include a severe allergic reaction or a reaction that caused hives, swelling, or respiratory distress, including wheezing.) A component of a COVID-19 vaccine or a previous dose of COVID-19 vaccine? No   Have you ever had an allergic reaction to another vaccine (other thanCOVID-19 vaccine) or an injectable medication? (This would include a severe allergic reaction or a reaction that caused hives, swelling, or respiratory distress, including wheezing.)   No    Do you have a history of any of the following:  Myocarditis or Pericarditis No  Dermal fillers:  No  Multisystem Inflammatory Syndrome (MIS-C or MIS-A)? No  COVID-19 disease within the past 3 months? No  Vaccinated with monkeypox vaccine in the last 4 weeks? No Pfizer Comirnaty [phone] (12 yrs+) and High Dose flu given without problem. Stayed for 15 min observation without difficulty. Updated [facility] copy given. Josie Saunders, RN

## 2022-09-10 ENCOUNTER — Encounter: Payer: Medicare Other | Admitting: Family Medicine

## 2022-11-12 ENCOUNTER — Ambulatory Visit (INDEPENDENT_AMBULATORY_CARE_PROVIDER_SITE_OTHER): Payer: Self-pay | Admitting: Dermatology

## 2022-11-12 VITALS — BP 152/90

## 2022-11-12 DIAGNOSIS — L719 Rosacea, unspecified: Secondary | ICD-10-CM

## 2022-11-12 MED ORDER — DOXYCYCLINE HYCLATE 20 MG PO TABS: 20.0000 mg | ORAL_TABLET | Freq: Every day | ORAL | 3 refills | Status: AC

## 2022-11-12 NOTE — Progress Notes (Signed)
   Follow-Up Visit   Subjective  Mary Galvan is a 72 y.o. female who presents for the following: Rosacea (BBL today).  The following portions of the chart were reviewed this encounter and updated as appropriate:   Tobacco  Allergies  Meds  Problems  Med Hx  Surg Hx  Fam Hx     Review of Systems:  No other skin or systemic complaints except as noted in HPI or Assessment and Plan.  Objective  Well appearing patient in no apparent distress; mood and affect are within normal limits.  A focused examination was performed including face. Relevant physical exam findings are noted in the Assessment and Plan.  Face                Assessment & Plan  Rosacea Face Rosacea is a chronic progressive skin condition usually affecting the face of adults, causing redness and/or acne bumps. It is treatable but not curable. It sometimes affects the eyes (ocular rosacea) as well. It may respond to topical and/or systemic medication and can flare with stress, sun exposure, alcohol, exercise, topical steroids (including hydrocortisone/cortisone 10) and some foods.  Daily application of broad spectrum spf 30+ sunscreen to face is recommended to reduce flares.  Doxycycline 20 mg 1 po qd with food and plenty of fluid prn flares Continue Skin Medicinals metronidazole/ivermectin/azelaic acid twice daily as needed to affected areas on the face.   doxycycline (PERIOSTAT) 20 MG tablet - Face Take 1 tablet (20 mg total) by mouth daily. With food and plenty of fluid  Photorejuvenation - Face Prior to the procedure, the patient's past medical history, medications, allergies, and the rare but potential risks and complications were reviewed with the patient and a signed consent was obtained.  Pre and post treatment care was discussed and instructions provided.   [place] BBL - 11/12/22 1400      Patient Details   Photo Takes: Yes    Consent Signed: Yes    Improvement from Previous Treatment:  Yes      Treatment Details   Date: 11/12/22    Treatment #: 2    Area: Face    Filter: 1st Pass      1st Pass   Location: F    Device: 560    BBL j/cm2: 27/26   27 - cheeks and nasal creases, 26 - chin and nose   PW Msec Sec: 26/27   26 - cheeks and nasal creases, 27 - chin and nose   Cooling Temp: 20    Pulses: 179     Patient tolerated the procedure well.   Nancy Fetter avoidance was stressed. The patient will call with any problems, questions or concerns prior to their next appointment.  Return for Follow up as scheduled.  I, Ashok Cordia, CMA, am acting as scribe for Sarina Ser, MD . Documentation: I have reviewed the above documentation for accuracy and completeness, and I agree with the above.  Sarina Ser, MD

## 2022-11-12 NOTE — Patient Instructions (Signed)
Due to recent changes in healthcare laws, you may see results of your pathology and/or laboratory studies on MyChart before the doctors have had a chance to review them. We understand that in some cases there may be results that are confusing or concerning to you. Please understand that not all results are received at the same time and often the doctors may need to interpret multiple results in order to provide you with the best plan of care or course of treatment. Therefore, we ask that you please give us 2 business days to thoroughly review all your results before contacting the office for clarification. Should we see a critical lab result, you will be contacted sooner.   If You Need Anything After Your Visit  If you have any questions or concerns for your doctor, please call our main line at [phone] and press option 4 to reach your doctor's medical assistant. If no one answers, please leave a voicemail as directed and we will return your call as soon as possible. Messages left after 4 pm will be answered the following business day.   You may also send us a message via MyChart. We typically respond to MyChart messages within 1-2 business days.  For prescription refills, please ask your pharmacy to contact our office. Our fax number is [phone].  If you have an urgent issue when the clinic is closed that cannot wait until the next business day, you can page your doctor at the number below.    Please note that while we do our best to be available for urgent issues outside of office hours, we are not available 24/7.   If you have an urgent issue and are unable to reach us, you may choose to seek medical care at your doctor's office, retail clinic, urgent care center, or emergency room.  If you have a medical emergency, please immediately call 911 or go to the emergency department.  Pager Numbers  - Dr. Kowalski: [phone]  - Dr. Moye: [phone]  - Dr. Stewart:  [phone]  In the event of inclement weather, please call our main line at [phone] for an update on the status of any delays or closures.  Dermatology Medication Tips: Please keep the boxes that topical medications come in in order to help keep track of the instructions about where and how to use these. Pharmacies typically print the medication instructions only on the boxes and not directly on the medication tubes.   If your medication is too expensive, please contact our office at [phone] option 4 or send us a message through MyChart.   We are unable to tell what your co-pay for medications will be in advance as this is different depending on your insurance coverage. However, we may be able to find a substitute medication at lower cost or fill out paperwork to get insurance to cover a needed medication.   If a prior authorization is required to get your medication covered by your insurance company, please allow us 1-2 business days to complete this process.  Drug prices often vary depending on where the prescription is filled and some pharmacies may offer cheaper prices.  The website www.goodrx.com contains coupons for medications through different pharmacies. The prices here do not account for what the cost may be with help from insurance (it may be cheaper with your insurance), but the website can give you the price if you did not use any insurance.  - You can print the associated coupon and take it with   your prescription to the pharmacy.  - You may also stop by our office during regular business hours and pick up a GoodRx coupon card.  - If you need your prescription sent electronically to a different pharmacy, notify our office through [facility] MyChart or by phone at [phone] option 4.     Si Usted Necesita Algo Despus de Su Visita  Tambin puede enviarnos un mensaje a travs de MyChart. Por lo general respondemos a los mensajes de MyChart en el transcurso de 1 a 2  das hbiles.  Para renovar recetas, por favor pida a su farmacia que se ponga en contacto con nuestra oficina. Nuestro nmero de fax es el [phone].  Si tiene un asunto urgente cuando la clnica est cerrada y que no puede esperar hasta el siguiente da hbil, puede llamar/localizar a su doctor(a) al nmero que aparece a continuacin.   Por favor, tenga en cuenta que aunque hacemos todo lo posible para estar disponibles para asuntos urgentes fuera del horario de oficina, no estamos disponibles las 24 horas del da, los 7 das de la semana.   Si tiene un problema urgente y no puede comunicarse con nosotros, puede optar por buscar atencin mdica  en el consultorio de su doctor(a), en una clnica privada, en un centro de atencin urgente o en una sala de emergencias.  Si tiene una emergencia mdica, por favor llame inmediatamente al 911 o vaya a la sala de emergencias.  Nmeros de bper  - Dr. Kowalski: [phone]  - Dra. Moye: [phone]  - Dra. Stewart: [phone]  En caso de inclemencias del tiempo, por favor llame a nuestra lnea principal al [phone] para una actualizacin sobre el estado de cualquier retraso o cierre.  Consejos para la medicacin en dermatologa: Por favor, guarde las cajas en las que vienen los medicamentos de uso tpico para ayudarle a seguir las instrucciones sobre dnde y cmo usarlos. Las farmacias generalmente imprimen las instrucciones del medicamento slo en las cajas y no directamente en los tubos del medicamento.   Si su medicamento es muy caro, por favor, pngase en contacto con nuestra oficina llamando al [phone] y presione la opcin 4 o envenos un mensaje a travs de MyChart.   No podemos decirle cul ser su copago por los medicamentos por adelantado ya que esto es diferente dependiendo de la cobertura de su seguro. Sin embargo, es posible que podamos encontrar un medicamento sustituto a menor costo o llenar un formulario para que el  seguro cubra el medicamento que se considera necesario.   Si se requiere una autorizacin previa para que su compaa de seguros cubra su medicamento, por favor permtanos de 1 a 2 das hbiles para completar este proceso.  Los precios de los medicamentos varan con frecuencia dependiendo del lugar de dnde se surte la receta y alguna farmacias pueden ofrecer precios ms baratos.  El sitio web www.goodrx.com tiene cupones para medicamentos de diferentes farmacias. Los precios aqu no tienen en cuenta lo que podra costar con la ayuda del seguro (puede ser ms barato con su seguro), pero el sitio web puede darle el precio si no utiliz ningn seguro.  - Puede imprimir el cupn correspondiente y llevarlo con su receta a la farmacia.  - Tambin puede pasar por nuestra oficina durante el horario de atencin regular y recoger una tarjeta de cupones de GoodRx.  - Si necesita que su receta se enve electrnicamente a una farmacia diferente, informe a nuestra oficina a travs de MyChart de [facility]   o por telfono llamando al [phone] y presione la opcin 4.  

## 2022-11-15 ENCOUNTER — Encounter: Payer: Self-pay | Admitting: Dermatology

## 2022-12-05 ENCOUNTER — Encounter: Payer: Medicare Other | Admitting: Family Medicine

## 2022-12-09 ENCOUNTER — Ambulatory Visit (INDEPENDENT_AMBULATORY_CARE_PROVIDER_SITE_OTHER): Payer: Medicare Other

## 2022-12-09 VITALS — Ht 64.0 in | Wt 157.0 lb

## 2022-12-09 DIAGNOSIS — Z0000 Encounter for general adult medical examination without abnormal findings: Secondary | ICD-10-CM

## 2022-12-09 NOTE — Progress Notes (Signed)
I connected with  Mary Galvan on 12/09/22 by a audio enabled telemedicine application and verified that I am speaking with the correct person using two identifiers.  Patient Location: Home  Provider Location: Office/Clinic  I discussed the limitations of evaluation and management by telemedicine. The patient expressed understanding and agreed to proceed.  Subjective:   Mary Galvan is a 72 y.o. female who presents for Medicare Annual (Subsequent) preventive examination.  Review of Systems     Cardiac Risk Factors include: advanced age (>39mn, >>65women);hypertension     Objective:    There were no vitals filed for this visit. There is no height or weight on file to calculate BMI.     12/09/2022    3:05 PM 05/02/2022    8:43 AM 11/05/2021    7:13 AM 10/10/2021   12:06 PM 08/13/2020    1:56 PM 05/16/2019    2:01 PM 05/12/2018   10:23 AM  Advanced Directives  Does Patient Have a Medical Advance Directive? No Yes No No No Yes No  Type of ACorporate treasurerof AAlta SierraLiving will    Living will;Healthcare Power of Attorney   Does patient want to make changes to medical advance directive?  No - Patient declined       Copy of HAugustain Chart?  Yes - validated most recent copy scanned in chart (See row information)    No - copy requested   Would patient like information on creating a medical advance directive? No - Patient declined No - Patient declined  No - Patient declined   Yes (MAU/Ambulatory/Procedural Areas - Information given)    Current Medications (verified) Outpatient Encounter Medications as of 12/09/2022  Medication Sig   Cholecalciferol (VITAMIN D3) 2000 UNITS TABS Take 2,000 Units by mouth daily.    Multiple Vitamins-Minerals (PRESERVISION AREDS 2 PO) Take 1 tablet by mouth 2 (two) times daily.   doxycycline (PERIOSTAT) 20 MG tablet Take 1 tablet (20 mg total) by mouth daily. With food and plenty of fluid   [DISCONTINUED]  erythromycin ophthalmic ointment Apply to sutures 4 times a day for 10-12 days.  Discontinue if allergy develops and call our office   [DISCONTINUED] traMADol (ULTRAM) 50 MG tablet Take 1 every 4-6 hours as needed for pain not controlled by Tylenol   No facility-administered encounter medications on file as of 12/09/2022.    Allergies (verified) Insect extract   History: Past Medical History:  Diagnosis Date   Actinic keratosis    Anxiety    Arthritis    thumbs   Basal cell carcinoma 10/20/2017   just under left brow   Depression    Dysplastic nevus 01/19/2018   right distal dorsum foot   Family history of adverse reaction to anesthesia    Fatty liver disease, nonalcoholic    GERD (gastroesophageal reflux disease)    Heart murmur    Hyperlipidemia    Hypertension    Kidney cysts    Motion sickness    reading in cars   Ocular migraine    PONV (postoperative nausea and vomiting)    slow to wake   Rosacea    Seasonal affective disorder (HCC)    Seasonal allergies    Seizures (HCC)    x1 - after head trauma. 1970's   [facility]  Past Surgical History:  Procedure Laterality Date   BASAL CELL CARCINOMA EXCISION Left  removed from above left eye    BROW LIFT Bilateral 05/02/2022   Procedure: BLEPHAROPLASTY UPPER EYELID; W/EXCESS SKIN BROW PTOSIS REPAIR BILATERAL;  Surgeon: Karle Starch, MD;  Location: [place];  Service: Ophthalmology;  Laterality: Bilateral;   CHOLECYSTECTOMY  2015   COLONOSCOPY WITH PROPOFOL N/A 05/10/2015   Procedure: COLONOSCOPY WITH PROPOFOL;  Surgeon: Lucilla Lame, MD;  Location: [place];  Service: Endoscopy;  Laterality: N/A;  WITH BIOPSY-- SIGMOID COLON POLYP  X  4 DESCENDING COLON POLYP   COLONOSCOPY WITH PROPOFOL N/A 11/05/2021   Procedure: COLONOSCOPY WITH PROPOFOL;  Surgeon: Lin Landsman, MD;  Location: [facility] ENDOSCOPY;  Service: Gastroenterology;  Laterality: N/A;    DILATATION & CURETTAGE/HYSTEROSCOPY WITH MYOSURE N/A 03/28/2016   Procedure: DILATATION & CURETTAGE/HYSTEROSCOPY WITH MYOSURE;  Surgeon: Boykin Nearing, MD;  Location: [facility];  Service: Gynecology;  Laterality: N/A;   ESOPHAGOGASTRODUODENOSCOPY (EGD) WITH PROPOFOL N/A 09/02/2017   Procedure: ESOPHAGOGASTRODUODENOSCOPY (EGD) WITH PROPOFOL;  Surgeon: Lin Landsman, MD;  Location: [place];  Service: Endoscopy;  Laterality: N/A;   MELANOMA EXCISION     pre melenoma excision on right foot    OTHER SURGICAL HISTORY     Uterine Polyp   Family History  Problem Relation Age of Onset   Cancer Mother        possible lung?   Dementia Mother    Cancer Sister        kidney   Diabetes Sister    COPD Sister    Cancer Paternal Grandfather        stomach and prostate   Diabetes Sister    Heart disease Sister    Hypertension Sister    COPD Sister    Stroke Maternal Uncle    Breast cancer Neg Hx    Social History   Socioeconomic History   Marital status: Married    Spouse name: Not on file   Number of children: Not on file   Years of education: Not on file   Highest education level: High school graduate  Occupational History   Occupation: retired  Tobacco Use   Smoking status: Former    Packs/day: 1.00    Years: 30.00    Total pack years: 30.00    Types: Cigarettes    Quit date: 03/13/2000    Years since quitting: 22.7   Smokeless tobacco: Never  Vaping Use   Vaping Use: Never used  Substance and Sexual Activity   Alcohol use: Yes    Comment: occasional   Drug use: No   Sexual activity: Not Currently    Partners: Male  Other Topics Concern   Not on file  Social History Narrative   Not on file   Social Determinants of Health   Financial Resource Strain: Low Risk  (12/09/2022)   Overall Financial Resource Strain (CARDIA)    Difficulty of Paying Living Expenses: Not hard at all  Food Insecurity: No Food Insecurity (12/09/2022)   Hunger Vital Sign     Worried About Running Out of Food in the Last Year: Never true    Ran Out of Food in the Last Year: Never true  Transportation Needs: No Transportation Needs (12/09/2022)   PRAPARE - Hydrologist (Medical): No    Lack of Transportation (Non-Medical): No  Physical Activity: Insufficiently Active (12/09/2022)   Exercise Vital Sign    Days of Exercise per Week: 7 days    Minutes of Exercise per Session: 20 min  Stress: No Stress Concern Present (12/09/2022)   [place]    Feeling of Stress : Not at all  Social Connections: Moderately Isolated (12/09/2022)   Social Connection and Isolation Panel [NHANES]    Frequency of Communication with Friends and Family: More than three times a week    Frequency of Social Gatherings with Friends and Family: More than three times a week    Attends Religious Services: Never    Marine scientist or Organizations: No    Attends Music therapist: Never    Marital Status: Married    Tobacco Counseling Counseling given: Not Answered   Clinical Intake:  Pre-visit preparation completed: Yes  Pain : No/denies pain     Nutritional Risks: None Diabetes: No  How often do you need to have someone help you when you read instructions, pamphlets, or other written materials from your doctor or pharmacy?: 1 - Never  Diabetic?no  Interpreter Needed?: No  Information entered by :: Kirke Shaggy, LPN   Activities of Daily Living    12/09/2022    3:06 PM 12/09/2022    2:30 PM  In your present state of health, do you have any difficulty performing the following activities:  Hearing? 0 0  Vision? 1 1  Difficulty concentrating or making decisions? 0 0  Walking or climbing stairs? 0 0  Dressing or bathing? 0 0  Doing errands, shopping? 0 0  Preparing Food and eating ? N N  Using the Toilet? N N  In the past six months, have you accidently  leaked urine? Y Y  Do you have problems with loss of bowel control? N N  Managing your Medications? N N  Managing your Finances? N N  Housekeeping or managing your Housekeeping? N N    Patient Care Team: Valerie Roys, DO as PCP - General (Family Medicine) Lucilla Lame, MD as Consulting Physician (Gastroenterology) Schermerhorn, Gwen Her, MD as Referring Physician (Obstetrics and Gynecology) [facility], [facility]) [facility], [facility] (Dermatology) Lyla Glassing, MD as Referring Physician (Ophthalmology)  Indicate any recent Medical Services you may have received from other than [facility] providers in the past year (date may be approximate).     Assessment:   This is a routine wellness examination for Eshanti.  Hearing/Vision screen Hearing Screening - Comments:: No aids Vision Screening - Comments:: Wears glasses-Dr.King  Dietary issues and exercise activities discussed: Current Exercise Habits: Home exercise routine, Type of exercise: walking, Time (Minutes): 25, Frequency (Times/Week): 7, Weekly Exercise (Minutes/Week): 175, Intensity: Mild   Goals Addressed             This Visit's Progress    DIET - EAT MORE FRUITS AND VEGETABLES         Depression Screen    12/09/2022    3:01 PM 03/11/2022    2:12 PM 10/10/2021   12:17 PM 09/10/2021   10:19 AM 02/20/2021   10:59 AM 08/13/2020    1:57 PM 01/23/2020    1:22 PM  PHQ 2/9 Scores  PHQ - 2 Score 0 0 0 0 0 0 0  PHQ- 9 Score 0 1 0 0  0 0    Fall Risk    12/09/2022    3:05 PM 12/09/2022    2:30 PM 03/11/2022    2:12 PM 10/10/2021   12:06 PM 09/10/2021   10:18 AM  Fall Risk   Falls in the past year? 0 0 0  0 0  Number falls in past yr: 0  0 0 0  Injury with Fall? 0  0 0 0  Risk for fall due to : No Fall Risks  No Fall Risks  No Fall Risks  Follow up Falls prevention discussed;Falls evaluation completed  Falls evaluation completed Falls evaluation completed;Education provided;Falls prevention discussed  Falls evaluation completed    FALL RISK PREVENTION PERTAINING TO THE HOME:  Any stairs in or around the home? Yes  If so, are there any without handrails? No  Home free of loose throw rugs in walkways, pet beds, electrical cords, etc? Yes  Adequate lighting in your home to reduce risk of falls? Yes   ASSISTIVE DEVICES UTILIZED TO PREVENT FALLS:  Life alert? No  Use of a cane, walker or w/c? No  Grab bars in the bathroom? Yes  Shower chair or bench in shower? No - has one in case she needs it Elevated toilet seat or a handicapped toilet? No    Cognitive Function:        12/09/2022    3:15 PM 08/13/2020    2:01 PM 05/12/2018   10:24 AM 05/07/2017    9:01 AM  6CIT Screen  What Year? 0 points 0 points 0 points 0 points  What month? 0 points 0 points 0 points 0 points  What time? 0 points 0 points 0 points 0 points  Count back from 20 0 points 0 points 0 points 0 points  Months in reverse 0 points 0 points 0 points 0 points  Repeat phrase 0 points 0 points 0 points 0 points  Total Score 0 points 0 points 0 points 0 points    Immunizations Immunization History  Administered Date(s) Administered   COVID-19, mRNA, vaccine(Comirnaty)12 years and older 09/02/2022   Fluad Quad(high Dose 65+) 07/25/2019, 08/13/2020, 09/10/2021   Influenza, High Dose Seasonal PF 09/02/2022   Influenza-Unspecified 08/18/2014, 09/20/2018   PFIZER(Purple Top)SARS-COV-2 Vaccination 11/22/2019, 12/13/2019, 08/02/2020, 09/02/2022   Pfizer Covid-19 Vaccine Bivalent Booster 32yr & up 10/11/2021   Pneumococcal Conjugate-13 05/03/2015   Pneumococcal Polysaccharide-23 04/21/2016   Tdap 05/16/2013   Zoster Recombinat (Shingrix) 03/09/2018, 06/04/2018    TDAP status: Up to date  Flu Vaccine status: Due, Education has been provided regarding the importance of this vaccine. Advised may receive this vaccine at local pharmacy or Health Dept. Aware to provide a copy of the vaccination record if obtained from  local pharmacy or Health Dept. Verbalized acceptance and understanding.  Pneumococcal vaccine status: Due, Education has been provided regarding the importance of this vaccine. Advised may receive this vaccine at local pharmacy or Health Dept. Aware to provide a copy of the vaccination record if obtained from local pharmacy or Health Dept. Verbalized acceptance and understanding.  Covid-19 vaccine status: Completed vaccines  Qualifies for Shingles Vaccine? Yes   Zostavax completed No   Shingrix Completed?: Yes  Screening Tests Health Maintenance  Topic Date Due   COVID-19 Vaccine (6 - 2023-24 season) 10/28/2022   MAMMOGRAM  12/10/2022   DTaP/Tdap/Td (2 - Td or Tdap) 05/17/2023   Medicare Annual Wellness (AWV)  12/10/2023   COLONOSCOPY (Pts 45-425yrInsurance coverage will need to be confirmed)  11/05/2026   Pneumonia Vaccine 6546Years old  Completed   INFLUENZA VACCINE  Completed   DEXA SCAN  Completed   Hepatitis C Screening  Completed   Zoster Vaccines- Shingrix  Completed   HPV VACCINES  Aged Out    Health Maintenance  Health Maintenance Due  Topic Date Due   COVID-19 Vaccine (6 - 2023-24 season) 10/28/2022    Colorectal cancer screening: Type of screening: Colonoscopy. Completed 11/05/21. Repeat every 5 years  Mammogram status: Completed 12/10/21. Repeat every year- wants to wait another year  Bone Density status: Completed 12/10/21. Results reflect: Bone density results: NORMAL. Repeat every 5 years.  Lung Cancer Screening: (Low Dose CT Chest recommended if Age 77-80 years, 30 pack-year currently smoking OR have quit w/in 15years.) does not qualify.     Additional Screening:  Hepatitis C Screening: does qualify; Completed 02/11/16  Vision Screening: Recommended annual ophthalmology exams for early detection of glaucoma and other disorders of the eye. Is the patient up to date with their annual eye exam?  Yes  Who is the provider or what is the name of the office in  which the patient attends annual eye exams? Dr.King If pt is not established with a provider, would they like to be referred to a provider to establish care? No .   Dental Screening: Recommended annual dental exams for proper oral hygiene  Community Resource Referral / Chronic Care Management: CRR required this visit?  No   CCM required this visit?  No      Plan:     I have personally reviewed and noted the following in the patient's chart:   Medical and social history Use of alcohol, tobacco or illicit drugs  Current medications and supplements including opioid prescriptions. Patient is not currently taking opioid prescriptions. Functional ability and status Nutritional status Physical activity Advanced directives List of other physicians Hospitalizations, surgeries, and ER visits in previous 12 months Vitals Screenings to include cognitive, depression, and falls Referrals and appointments  In addition, I have reviewed and discussed with patient certain preventive protocols, quality metrics, and best practice recommendations. A written personalized care plan for preventive services as well as general preventive health recommendations were provided to patient.     Dionisio David, LPN   [id number]   Nurse Notes: none

## 2022-12-09 NOTE — Patient Instructions (Signed)
Mary Galvan , Thank you for taking time to come for your Medicare Wellness Visit. I appreciate your ongoing commitment to your health goals. Please review the following plan we discussed and let me know if I can assist you in the future.   These are the goals we discussed:  Goals      DIET - EAT MORE FRUITS AND VEGETABLES     DIET - INCREASE WATER INTAKE     Recommend drinking at least 6-8 glasses of water a day      Increase water intake     Recommend drinking at least 5-6 glasses of water a day.      Patient Stated     08/13/2020, wants to eat healthy with low carbs and sugar     RNCM: Sometimes my blood pressure is high     Current Barriers:  Chronic Disease Management support, education, and care coordination needs related to HTN, HLD, and Abdominal Aortic Atherosclerosis   Clinical Goal(s) related to HTN, HLD, and Abdominal Aortic Atherosclerosis :  Over the next 60 days, patient will:  Work with the care management team to address educational, disease management, and care coordination needs  Begin or continue self health monitoring activities as directed today Measure and record blood pressure 2 or more times per week Call provider office for new or worsened signs and symptoms Blood pressure findings outside established parameters and New or worsened symptom related to chronic medical conditions Call care management team with questions or concerns Verbalize basic understanding of patient centered plan of care established today  Interventions related to HTN, HLD, and Abdominal Aortic Atherosclerosis :  Evaluation of current treatment plans and patient's adherence to plan as established by provider Assessed patient understanding of disease states Assessed patient's education and care coordination needs Provided disease specific education to patient  Collaborated with appropriate clinical care team members regarding patient needs Review of Heart Healthy Diet and eating  habits Evaluation of activity levels and weight management  Patient Self Care Activities related to : HTN, HLD, and Abdominal Aortic Atherosclerosis  Patient is unable to independently self-manage chronic health conditions  Initial goal documentation      Weight (lb) < 200 lb (90.7 kg)        This is a list of the screening recommended for you and due dates:  Health Maintenance  Topic Date Due   COVID-19 Vaccine (6 - 2023-24 season) 10/28/2022   Mammogram  12/10/2022   DTaP/Tdap/Td vaccine (2 - Td or Tdap) 05/17/2023   Medicare Annual Wellness Visit  12/10/2023   Colon Cancer Screening  11/05/2026   Pneumonia Vaccine  Completed   Flu Shot  Completed   DEXA scan (bone density measurement)  Completed   Hepatitis C Screening: USPSTF Recommendation to screen - Ages 68-79 yo.  Completed   Zoster (Shingles) Vaccine  Completed   HPV Vaccine  Aged Out    Advanced directives: no  Conditions/risks identified: none  Next appointment: Follow up in one year for your annual wellness visit 12/15/23 @ 2:00 pm by phone   Preventive Care 65 Years and Older, Female Preventive care refers to lifestyle choices and visits with your health care provider that can promote health and wellness. What does preventive care include? A yearly physical exam. This is also called an annual well check. Dental exams once or twice a year. Routine eye exams. Ask your health care provider how often you should have your eyes checked. Personal lifestyle choices, including: Daily  care of your teeth and gums. Regular physical activity. Eating a healthy diet. Avoiding tobacco and drug use. Limiting alcohol use. Practicing safe sex. Taking low-dose aspirin every day. Taking vitamin and mineral supplements as recommended by your health care provider. What happens during an annual well check? The services and screenings done by your health care provider during your annual well check will depend on your age, overall  health, lifestyle risk factors, and family history of disease. Counseling  Your health care provider may ask you questions about your: Alcohol use. Tobacco use. Drug use. Emotional well-being. Home and relationship well-being. Sexual activity. Eating habits. History of falls. Memory and ability to understand (cognition). Work and work Statistician. Reproductive health. Screening  You may have the following tests or measurements: Height, weight, and BMI. Blood pressure. Lipid and cholesterol levels. These may be checked every 5 years, or more frequently if you are over 69 years old. Skin check. Lung cancer screening. You may have this screening every year starting at age 30 if you have a 30-pack-year history of smoking and currently smoke or have quit within the past 15 years. Fecal occult blood test (FOBT) of the stool. You may have this test every year starting at age 27. Flexible sigmoidoscopy or colonoscopy. You may have a sigmoidoscopy every 5 years or a colonoscopy every 10 years starting at age 57. Hepatitis C blood test. Hepatitis B blood test. Sexually transmitted disease (STD) testing. Diabetes screening. This is done by checking your blood sugar (glucose) after you have not eaten for a while (fasting). You may have this done every 1-3 years. Bone density scan. This is done to screen for osteoporosis. You may have this done starting at age 14. Mammogram. This may be done every 1-2 years. Talk to your health care provider about how often you should have regular mammograms. Talk with your health care provider about your test results, treatment options, and if necessary, the need for more tests. Vaccines  Your health care provider may recommend certain vaccines, such as: Influenza vaccine. This is recommended every year. Tetanus, diphtheria, and acellular pertussis (Tdap, Td) vaccine. You may need a Td booster every 10 years. Zoster vaccine. You may need this after age  63. Pneumococcal 13-valent conjugate (PCV13) vaccine. One dose is recommended after age 89. Pneumococcal polysaccharide (PPSV23) vaccine. One dose is recommended after age 82. Talk to your health care provider about which screenings and vaccines you need and how often you need them. This information is not intended to replace advice given to you by your health care provider. Make sure you discuss any questions you have with your health care provider. Document Released: 10/26/2015 Document Revised: 06/18/2016 Document Reviewed: 07/31/2015 Elsevier Interactive Patient Education  2017 [place] Prevention in the Home Falls can cause injuries. They can happen to people of all ages. There are many things you can do to make your home safe and to help prevent falls. What can I do on the outside of my home? Regularly fix the edges of walkways and driveways and fix any cracks. Remove anything that might make you trip as you walk through a door, such as a raised step or threshold. Trim any bushes or trees on the path to your home. Use bright outdoor lighting. Clear any walking paths of anything that might make someone trip, such as rocks or tools. Regularly check to see if handrails are loose or broken. Make sure that both sides of any steps have handrails. Any  raised decks and porches should have guardrails on the edges. Have any leaves, snow, or ice cleared regularly. Use sand or salt on walking paths during winter. Clean up any spills in your garage right away. This includes oil or grease spills. What can I do in the bathroom? Use night lights. Install grab bars by the toilet and in the tub and shower. Do not use towel bars as grab bars. Use non-skid mats or decals in the tub or shower. If you need to sit down in the shower, use a plastic, non-slip stool. Keep the floor dry. Clean up any water that spills on the floor as soon as it happens. Remove soap buildup in the tub or shower  regularly. Attach bath mats securely with double-sided non-slip rug tape. Do not have throw rugs and other things on the floor that can make you trip. What can I do in the bedroom? Use night lights. Make sure that you have a light by your bed that is easy to reach. Do not use any sheets or blankets that are too big for your bed. They should not hang down onto the floor. Have a firm chair that has side arms. You can use this for support while you get dressed. Do not have throw rugs and other things on the floor that can make you trip. What can I do in the kitchen? Clean up any spills right away. Avoid walking on wet floors. Keep items that you use a lot in easy-to-reach places. If you need to reach something above you, use a strong step stool that has a grab bar. Keep electrical cords out of the way. Do not use floor polish or wax that makes floors slippery. If you must use wax, use non-skid floor wax. Do not have throw rugs and other things on the floor that can make you trip. What can I do with my stairs? Do not leave any items on the stairs. Make sure that there are handrails on both sides of the stairs and use them. Fix handrails that are broken or loose. Make sure that handrails are as long as the stairways. Check any carpeting to make sure that it is firmly attached to the stairs. Fix any carpet that is loose or worn. Avoid having throw rugs at the top or bottom of the stairs. If you do have throw rugs, attach them to the floor with carpet tape. Make sure that you have a light switch at the top of the stairs and the bottom of the stairs. If you do not have them, ask someone to add them for you. What else can I do to help prevent falls? Wear shoes that: Do not have high heels. Have rubber bottoms. Are comfortable and fit you well. Are closed at the toe. Do not wear sandals. If you use a stepladder: Make sure that it is fully opened. Do not climb a closed stepladder. Make sure that  both sides of the stepladder are locked into place. Ask someone to hold it for you, if possible. Clearly mark and make sure that you can see: Any grab bars or handrails. First and last steps. Where the edge of each step is. Use tools that help you move around (mobility aids) if they are needed. These include: Canes. Walkers. Scooters. Crutches. Turn on the lights when you go into a dark area. Replace any light bulbs as soon as they burn out. Set up your furniture so you have a clear path. Avoid moving your  furniture around. If any of your floors are uneven, fix them. If there are any pets around you, be aware of where they are. Review your medicines with your doctor. Some medicines can make you feel dizzy. This can increase your chance of falling. Ask your doctor what other things that you can do to help prevent falls. This information is not intended to replace advice given to you by your health care provider. Make sure you discuss any questions you have with your health care provider. Document Released: 07/26/2009 Document Revised: 03/06/2016 Document Reviewed: 11/03/2014 Elsevier Interactive Patient Education  2017 [employer].

## 2022-12-16 ENCOUNTER — Encounter: Payer: Self-pay | Admitting: Family Medicine

## 2022-12-16 ENCOUNTER — Ambulatory Visit (INDEPENDENT_AMBULATORY_CARE_PROVIDER_SITE_OTHER): Payer: Medicare Other | Admitting: Family Medicine

## 2022-12-16 VITALS — BP 128/84 | HR 70 | Temp 98.5°F | Ht 64.0 in | Wt 180.0 lb

## 2022-12-16 DIAGNOSIS — Z1231 Encounter for screening mammogram for malignant neoplasm of breast: Secondary | ICD-10-CM

## 2022-12-16 DIAGNOSIS — Z8679 Personal history of other diseases of the circulatory system: Secondary | ICD-10-CM | POA: Diagnosis not present

## 2022-12-16 DIAGNOSIS — Z23 Encounter for immunization: Secondary | ICD-10-CM

## 2022-12-16 DIAGNOSIS — E559 Vitamin D deficiency, unspecified: Secondary | ICD-10-CM

## 2022-12-16 DIAGNOSIS — Z0000 Encounter for general adult medical examination without abnormal findings: Secondary | ICD-10-CM

## 2022-12-16 DIAGNOSIS — R739 Hyperglycemia, unspecified: Secondary | ICD-10-CM

## 2022-12-16 DIAGNOSIS — S41101A Unspecified open wound of right upper arm, initial encounter: Secondary | ICD-10-CM

## 2022-12-16 DIAGNOSIS — I700 Atherosclerosis of aorta: Secondary | ICD-10-CM | POA: Diagnosis not present

## 2022-12-16 DIAGNOSIS — E782 Mixed hyperlipidemia: Secondary | ICD-10-CM | POA: Diagnosis not present

## 2022-12-16 DIAGNOSIS — F3342 Major depressive disorder, recurrent, in full remission: Secondary | ICD-10-CM

## 2022-12-16 LAB — BAYER DCA HB A1C WAIVED: HB A1C (BAYER DCA - WAIVED): 5.9 % — ABNORMAL HIGH (ref 4.8–5.6)

## 2022-12-16 LAB — MICROALBUMIN, URINE WAIVED
Creatinine, Urine Waived: 10 mg/dL (ref 10–300)
Microalb, Ur Waived: 10 mg/L (ref 0–19)
Microalb/Creat Ratio: <30 mg/g (ref <30)

## 2022-12-16 LAB — URINALYSIS, ROUTINE W REFLEX MICROSCOPIC
Bilirubin, UA: NEGATIVE
Glucose, UA: NEGATIVE
Ketones, UA: NEGATIVE
Leukocytes,UA: NEGATIVE
Nitrite, UA: NEGATIVE
Protein,UA: NEGATIVE
RBC, UA: NEGATIVE
Specific Gravity, UA: 1.01 (ref 1.005–1.030)
Urobilinogen, Ur: 0.2 mg/dL (ref 0.2–1.0)
pH, UA: 5.5 (ref 5.0–7.5)

## 2022-12-16 NOTE — Assessment & Plan Note (Signed)
Doing well off medicine. Continue to monitor. Call with any concerns.  

## 2022-12-16 NOTE — Progress Notes (Signed)
BP 128/84   Pulse 70   Temp 98.5 F (36.9 C) (Oral)   Ht '5\' 4"'$  (1.626 m)   Wt 180 lb (81.6 kg)   SpO2 98%   BMI 30.90 kg/m    Subjective:    Patient ID: Mary Galvan, female    DOB: May 29, 1951, 72 y.o.   MRN: [id number]  HPI: Mary Galvan is a 72 y.o. female presenting on 12/16/2022 for comprehensive medical examination. Current medical complaints include:  Gained her weight back and she has been having a lot of trouble getting it back off.   HISTORY of HYPERTENSION / HYPERLIPIDEMIA Satisfied with current treatment? yes Duration of hypertension: not currently, but chronically previously BP monitoring frequency: rarely BP medication side effects: N/A Past BP meds: HCTZ Duration of hyperlipidemia: chronic Cholesterol medication side effects: N/A Cholesterol supplements: none Past cholesterol medications: none Medication compliance: excellent compliance Aspirin: no Recent stressors: no Recurrent headaches: no Visual changes: no Palpitations: no Dyspnea: no Chest pain: no Lower extremity edema: no Dizzy/lightheaded: no  Menopausal Symptoms: no  Depression Screen done today and results listed below:     12/16/2022   10:21 AM 12/16/2022   10:12 AM 12/09/2022    3:01 PM 03/11/2022    2:12 PM 10/10/2021   12:17 PM  Depression screen PHQ 2/9  Decreased Interest 1 1 0 0 0  Down, Depressed, Hopeless 1 1 0 0 0  PHQ - 2 Score 2 2 0 0 0  Altered sleeping 0 0 0 1 0  Tired, decreased energy 1 1 0 0 0  Change in appetite 1 1 0 0 0  Feeling bad or failure about yourself  1 1 0 0 0  Trouble concentrating 0 0 0 0 0  Moving slowly or fidgety/restless 0 0 0 0 0  Suicidal thoughts 0 0 0 0 0  PHQ-9 Score 5 5 0 1 0  Difficult doing work/chores  Not difficult at all Not difficult at all Not difficult at all Not difficult at all    Past Medical History:  Past Medical History:  Diagnosis Date   Actinic keratosis    Anxiety    Arthritis    thumbs   Basal cell carcinoma  10/20/2017   just under left brow   Depression    Dysplastic nevus 01/19/2018   right distal dorsum foot   Family history of adverse reaction to anesthesia    Fatty liver disease, nonalcoholic    GERD (gastroesophageal reflux disease)    Heart murmur    Hyperlipidemia    Hypertension    Kidney cysts    Motion sickness    reading in cars   Ocular migraine    PONV (postoperative nausea and vomiting)    slow to wake   Rosacea    Seasonal affective disorder (HCC)    Seasonal allergies    Seizures ([place])    x1 - after head trauma. 1970's   Vitreous detachment of left eye 04/2011   [facility]    Surgical History:  Past Surgical History:  Procedure Laterality Date   BASAL CELL CARCINOMA EXCISION Left    removed from above left eye    BROW LIFT Bilateral 05/02/2022   Procedure: BLEPHAROPLASTY UPPER EYELID; W/EXCESS SKIN BROW PTOSIS REPAIR BILATERAL;  Surgeon: Karle Starch, MD;  Location: [place];  Service: Ophthalmology;  Laterality: Bilateral;   CHOLECYSTECTOMY  2015   COLONOSCOPY WITH PROPOFOL N/A 05/10/2015   Procedure: COLONOSCOPY WITH PROPOFOL;  Surgeon: Lucilla Lame, MD;  Location: [place];  Service: Endoscopy;  Laterality: N/A;  WITH BIOPSY-- SIGMOID COLON POLYP  X  4 DESCENDING COLON POLYP   COLONOSCOPY WITH PROPOFOL N/A 11/05/2021   Procedure: COLONOSCOPY WITH PROPOFOL;  Surgeon: Lin Landsman, MD;  Location: [facility] ENDOSCOPY;  Service: Gastroenterology;  Laterality: N/A;   DILATATION & CURETTAGE/HYSTEROSCOPY WITH MYOSURE N/A 03/28/2016   Procedure: DILATATION & CURETTAGE/HYSTEROSCOPY WITH MYOSURE;  Surgeon: Boykin Nearing, MD;  Location: [facility];  Service: Gynecology;  Laterality: N/A;   ESOPHAGOGASTRODUODENOSCOPY (EGD) WITH PROPOFOL N/A 09/02/2017   Procedure: ESOPHAGOGASTRODUODENOSCOPY (EGD) WITH PROPOFOL;  Surgeon: Lin Landsman, MD;  Location: [place];  Service: Endoscopy;  Laterality: N/A;   MELANOMA EXCISION      pre melenoma excision on right foot    OTHER SURGICAL HISTORY     Uterine Polyp    Medications:  Current Outpatient Medications on File Prior to Visit  Medication Sig   Cholecalciferol (VITAMIN D3) 2000 UNITS TABS Take 2,000 Units by mouth daily.    doxycycline (PERIOSTAT) 20 MG tablet Take 1 tablet (20 mg total) by mouth daily. With food and plenty of fluid   Multiple Vitamins-Minerals (PRESERVISION AREDS 2 PO) Take 1 tablet by mouth 2 (two) times daily.   No current facility-administered medications on file prior to visit.    Allergies:  Allergies  Allergen Reactions   Insect Extract Itching and Swelling    "Insect bites" reaction "large" hives, itching, swelling    Social History:  Social History   Socioeconomic History   Marital status: Married    Spouse name: Not on file   Number of children: Not on file   Years of education: Not on file   Highest education level: High school graduate  Occupational History   Occupation: retired  Tobacco Use   Smoking status: Former    Packs/day: 1.00    Years: 30.00    Total pack years: 30.00    Types: Cigarettes    Quit date: 03/13/2000    Years since quitting: 22.7   Smokeless tobacco: Never  Vaping Use   Vaping Use: Never used  Substance and Sexual Activity   Alcohol use: Yes    Comment: occasional   Drug use: No   Sexual activity: Not Currently    Partners: Male  Other Topics Concern   Not on file  Social History Narrative   Not on file   Social Determinants of Health   Financial Resource Strain: Low Risk  (12/09/2022)   Overall Financial Resource Strain (CARDIA)    Difficulty of Paying Living Expenses: Not hard at all  Food Insecurity: No Food Insecurity (12/09/2022)   Hunger Vital Sign    Worried About Running Out of Food in the Last Year: Never true    [place] in the Last Year: Never true  Transportation Needs: No Transportation Needs (12/09/2022)   PRAPARE - Radiographer, therapeutic (Medical): No    Lack of Transportation (Non-Medical): No  Physical Activity: Insufficiently Active (12/09/2022)   Exercise Vital Sign    Days of Exercise per Week: 7 days    Minutes of Exercise per Session: 20 min  Stress: No Stress Concern Present (12/09/2022)   [place]    Feeling of Stress : Not at all  Social Connections: Moderately Isolated (12/09/2022)   Social Connection and Isolation Panel [NHANES]    Frequency of Communication  with Friends and Family: More than three times a week    Frequency of Social Gatherings with Friends and Family: More than three times a week    Attends Religious Services: Never    Marine scientist or Organizations: No    Attends Archivist Meetings: Never    Marital Status: Married  Human resources officer Violence: Not At Risk (12/09/2022)   Humiliation, Afraid, Rape, and Kick questionnaire    Fear of Current or Ex-Partner: No    Emotionally Abused: No    Physically Abused: No    Sexually Abused: No   Social History   Tobacco Use  Smoking Status Former   Packs/day: 1.00   Years: 30.00   Total pack years: 30.00   Types: Cigarettes   Quit date: 03/13/2000   Years since quitting: 22.7  Smokeless Tobacco Never   Social History   Substance and Sexual Activity  Alcohol Use Yes   Comment: occasional    Family History:  Family History  Problem Relation Age of Onset   Cancer Mother        possible lung?   Dementia Mother    Cancer Sister        kidney   Diabetes Sister    COPD Sister    Cancer Paternal Grandfather        stomach and prostate   Diabetes Sister    Heart disease Sister    Hypertension Sister    COPD Sister    Stroke Maternal Uncle    Breast cancer Neg Hx     Past medical history, surgical history, medications, allergies, family history and social history reviewed with patient today and changes made to appropriate areas of the  chart.   Review of Systems  Constitutional: Negative.   HENT: Negative.    Eyes:  Positive for blurred vision. Negative for double vision, photophobia, pain, discharge and redness.  Respiratory: Negative.    Cardiovascular:  Positive for palpitations. Negative for chest pain, orthopnea, claudication, leg swelling and PND.  Gastrointestinal: Negative.   Genitourinary: Negative.   Musculoskeletal:  Positive for joint pain (L knee). Negative for back pain, falls, myalgias and neck pain.  Skin: Negative.   Neurological: Negative.   Endo/Heme/Allergies:  Positive for environmental allergies. Negative for polydipsia. Does not bruise/bleed easily.  Psychiatric/Behavioral:  Negative for depression, hallucinations, memory loss, substance abuse and suicidal ideas. The patient is not nervous/anxious and does not have insomnia.        Just a little down feeling    All other ROS negative except what is listed above and in the HPI.      Objective:    BP 128/84   Pulse 70   Temp 98.5 F (36.9 C) (Oral)   Ht '5\' 4"'$  (1.626 m)   Wt 180 lb (81.6 kg)   SpO2 98%   BMI 30.90 kg/m   Wt Readings from Last 3 Encounters:  12/16/22 180 lb (81.6 kg)  12/09/22 157 lb (71.2 kg)  05/02/22 159 lb (72.1 kg)    Physical Exam Vitals and nursing note reviewed.  Constitutional:      General: She is not in acute distress.    Appearance: Normal appearance. She is not ill-appearing, toxic-appearing or diaphoretic.  HENT:     Head: Normocephalic and atraumatic.     Right Ear: Tympanic membrane, ear canal and external ear normal. There is no impacted cerumen.     Left Ear: Tympanic membrane, ear canal and external ear normal.  There is no impacted cerumen.     Nose: Nose normal. No congestion or rhinorrhea.     Mouth/Throat:     Mouth: Mucous membranes are moist.     Pharynx: Oropharynx is clear. No oropharyngeal exudate or posterior oropharyngeal erythema.  Eyes:     General: No scleral icterus.       Right  eye: No discharge.        Left eye: No discharge.     Extraocular Movements: Extraocular movements intact.     Conjunctiva/sclera: Conjunctivae normal.     Pupils: Pupils are equal, round, and reactive to light.  Neck:     Vascular: No carotid bruit.  Cardiovascular:     Rate and Rhythm: Normal rate and regular rhythm.     Pulses: Normal pulses.     Heart sounds: No murmur heard.    No friction rub. No gallop.  Pulmonary:     Effort: Pulmonary effort is normal. No respiratory distress.     Breath sounds: Normal breath sounds. No stridor. No wheezing, rhonchi or rales.  Chest:     Chest wall: No tenderness.  Abdominal:     General: Abdomen is flat. Bowel sounds are normal. There is no distension.     Palpations: Abdomen is soft. There is no mass.     Tenderness: There is no abdominal tenderness. There is no right CVA tenderness, left CVA tenderness, guarding or rebound.     Hernia: No hernia is present.  Genitourinary:    Comments: Breast and pelvic exams deferred with shared decision making Musculoskeletal:        General: No swelling, tenderness, deformity or signs of injury.     Cervical back: Normal range of motion and neck supple. No rigidity. No muscular tenderness.     Right lower leg: No edema.     Left lower leg: No edema.  Lymphadenopathy:     Cervical: No cervical adenopathy.  Skin:    General: Skin is warm and dry.     Capillary Refill: Capillary refill takes less than 2 seconds.     Coloration: Skin is not jaundiced or pale.     Findings: No bruising, erythema, lesion or rash.  Neurological:     General: No focal deficit present.     Mental Status: She is alert and oriented to person, place, and time. Mental status is at baseline.     Cranial Nerves: No cranial nerve deficit.     Sensory: No sensory deficit.     Motor: No weakness.     Coordination: Coordination normal.     Gait: Gait normal.     Deep Tendon Reflexes: Reflexes normal.  Psychiatric:         Mood and Affect: Mood normal.        Behavior: Behavior normal.        Thought Content: Thought content normal.        Judgment: Judgment normal.     Results for orders placed or performed in visit on 12/16/22  Urinalysis, Routine w reflex microscopic  Result Value Ref Range   Specific Gravity, UA 1.010 1.005 - 1.030   pH, UA 5.5 5.0 - 7.5   Color, UA Yellow Yellow   Appearance Ur Clear Clear   Leukocytes,UA Negative Negative   Protein,UA Negative Negative/Trace   Glucose, UA Negative Negative   Ketones, UA Negative Negative   RBC, UA Negative Negative   Bilirubin, UA Negative Negative   Urobilinogen, Ur 0.2 0.2 -  1.0 mg/dL   Nitrite, UA Negative Negative   Microscopic Examination Comment   Microalbumin, Urine Waived  Result Value Ref Range   Microalb, Ur Waived 10 0 - 19 mg/L   Creatinine, Urine Waived 10 10 - 300 mg/dL   Microalb/Creat Ratio <30 <30 mg/g  Bayer DCA Hb A1c Waived  Result Value Ref Range   HB A1C (BAYER DCA - WAIVED) 5.9 (H) 4.8 - 5.6 %      Assessment & Plan:   Problem List Items Addressed This Visit       Cardiovascular and Mediastinum   Abdominal aortic atherosclerosis (HCC)    Will keep BP and cholesterol under good control. Continue to monitor. Call with any concerns.       Relevant Orders   CBC with Differential/Platelet   Comprehensive metabolic panel     Other   Hyperlipidemia    Rechecking labs today. Await results. Treat as needed.        Relevant Orders   CBC with Differential/Platelet   Comprehensive metabolic panel   Lipid Panel w/o Chol/HDL Ratio   History of hypertension    Doing well off medicine. Checking labs today. Await results. Treat as needed.       Relevant Orders   Comprehensive metabolic panel   Urinalysis, Routine w reflex microscopic (Completed)   TSH   Microalbumin, Urine Waived (Completed)   Depression    Doing well off medicine. Continue to monitor. Call with any concerns.       Vitamin D deficiency     Rechecking labs today. Await results. Treat as needed.       Relevant Orders   CBC with Differential/Platelet   VITAMIN D 25 Hydroxy (Vit-D Deficiency, Fractures)   Other Visit Diagnoses     Routine general medical examination at a health care facility    -  Primary   Vaccines up to date. Screening labs checked today. Mammo and colonoscopy up to date. Continue diet and exercise. Call with any concerns.   Hyperglycemia       Checking labs today. Await results.   Relevant Orders   Bayer [place] Hb A1c Waived (Completed)   Encounter for screening mammogram for malignant neoplasm of breast       Mammogram ordered today- will do next year.   Relevant Orders   MM DIGITAL SCREENING BILATERAL   Open wound of right upper arm, initial encounter       Healing well. Due for Td. Given today. Call with any concerns.   Relevant Orders   Td : Tetanus/diphtheria >7yo Preservative  free (Completed)        Follow up plan: Return in about 1 year (around 12/16/2023) for physical.   LABORATORY TESTING:  - Pap smear: not applicable  IMMUNIZATIONS:   - Tdap: Tetanus vaccination status reviewed: Td vaccination indicated and given today. - Influenza: Up to date - Pneumovax: Up to date - Prevnar: Up to date - COVID: Up to date - HPV: Not applicable - Shingrix vaccine: Up to date  SCREENING: -Mammogram: Ordered today  - Colonoscopy: Up to date  - Bone Density: Up to date   PATIENT COUNSELING:   Advised to take 1 mg of folate supplement per day if capable of pregnancy.   Sexuality: Discussed sexually transmitted diseases, partner selection, use of condoms, avoidance of unintended pregnancy  and contraceptive alternatives.   Advised to avoid cigarette smoking.  I discussed with the patient that most people either abstain from alcohol or  drink within safe limits (<=14/week and <=4 drinks/occasion for males, <=7/weeks and <= 3 drinks/occasion for females) and that the risk for alcohol disorders  and other health effects rises proportionally with the number of drinks per week and how often a drinker exceeds daily limits.  Discussed cessation/primary prevention of drug use and availability of treatment for abuse.   Diet: Encouraged to adjust caloric intake to maintain  or achieve ideal body weight, to reduce intake of dietary saturated fat and total fat, to limit sodium intake by avoiding high sodium foods and not adding table salt, and to maintain adequate dietary potassium and calcium preferably from fresh fruits, vegetables, and low-fat dairy products.    stressed the importance of regular exercise  Injury prevention: Discussed safety belts, safety helmets, smoke detector, smoking near bedding or upholstery.   Dental health: Discussed importance of regular tooth brushing, flossing, and dental visits.    NEXT PREVENTATIVE PHYSICAL DUE IN 1 YEAR. Return in about 1 year (around 12/16/2023) for physical.

## 2022-12-16 NOTE — Assessment & Plan Note (Signed)
Will keep BP and cholesterol under good control. Continue to monitor. Call with any concerns.

## 2022-12-16 NOTE — Assessment & Plan Note (Signed)
Doing well off medicine. Checking labs today. Await results. Treat as needed.

## 2022-12-16 NOTE — Patient Instructions (Signed)
Please call to schedule your mammogram and/or bone density: [facility]  Address: [address], , [place] [address] Phone: [phone]  [facility] [place] [address] [place],  [place]  [address] Phone: [phone]   

## 2022-12-16 NOTE — Assessment & Plan Note (Signed)
Rechecking labs today. Await results. Treat as needed.  °

## 2022-12-17 LAB — CBC WITH DIFFERENTIAL/PLATELET
Basophils Absolute: 0 10*3/uL (ref 0.0–0.2)
Basos: 1 %
EOS (ABSOLUTE): 0.1 10*3/uL (ref 0.0–0.4)
Eos: 1 %
Hematocrit: 47 % — ABNORMAL HIGH (ref 34.0–46.6)
Hemoglobin: 15.6 g/dL (ref 11.1–15.9)
Immature Grans (Abs): 0 10*3/uL (ref 0.0–0.1)
Immature Granulocytes: 0 %
Lymphocytes Absolute: 2.4 10*3/uL (ref 0.7–3.1)
Lymphs: 38 %
MCH: 30.8 pg (ref 26.6–33.0)
MCHC: 33.2 g/dL (ref 31.5–35.7)
MCV: 93 fL (ref 79–97)
Monocytes Absolute: 0.5 10*3/uL (ref 0.1–0.9)
Monocytes: 8 %
Neutrophils Absolute: 3.4 10*3/uL (ref 1.4–7.0)
Neutrophils: 52 %
Platelets: 229 10*3/uL (ref 150–450)
RBC: 5.06 x10E6/uL (ref 3.77–5.28)
RDW: 12.8 % (ref 11.7–15.4)
WBC: 6.4 10*3/uL (ref 3.4–10.8)

## 2022-12-17 LAB — COMPREHENSIVE METABOLIC PANEL
ALT: 19 IU/L (ref 0–32)
AST: 19 IU/L (ref 0–40)
Albumin/Globulin Ratio: 1.8 (ref 1.2–2.2)
Albumin: 4.3 g/dL (ref 3.8–4.8)
Alkaline Phosphatase: 88 IU/L (ref 44–121)
BUN/Creatinine Ratio: 27 (ref 12–28)
BUN: 17 mg/dL (ref 8–27)
Bilirubin Total: 0.3 mg/dL (ref 0.0–1.2)
CO2: 24 mmol/L (ref 20–29)
Calcium: 9.9 mg/dL (ref 8.7–10.3)
Chloride: 100 mmol/L (ref 96–106)
Creatinine, Ser: 0.63 mg/dL (ref 0.57–1.00)
Globulin, Total: 2.4 g/dL (ref 1.5–4.5)
Glucose: 95 mg/dL (ref 70–99)
Potassium: 4.2 mmol/L (ref 3.5–5.2)
Sodium: 142 mmol/L (ref 134–144)
Total Protein: 6.7 g/dL (ref 6.0–8.5)
eGFR: 94 mL/min/{1.73_m2} (ref >59)

## 2022-12-17 LAB — LIPID PANEL W/O CHOL/HDL RATIO
Cholesterol, Total: 268 mg/dL — ABNORMAL HIGH (ref 100–199)
HDL: 60 mg/dL (ref >39)
LDL Chol Calc (NIH): 180 mg/dL — ABNORMAL HIGH (ref 0–99)
Triglycerides: 155 mg/dL — ABNORMAL HIGH (ref 0–149)
VLDL Cholesterol Cal: 28 mg/dL (ref 5–40)

## 2022-12-17 LAB — VITAMIN D 25 HYDROXY (VIT D DEFICIENCY, FRACTURES): Vit D, 25-Hydroxy: 33.4 ng/mL (ref 30.0–100.0)

## 2022-12-17 LAB — TSH: TSH: 1.55 u[IU]/mL (ref 0.450–4.500)

## 2023-01-15 DIAGNOSIS — H2513 Age-related nuclear cataract, bilateral: Secondary | ICD-10-CM | POA: Diagnosis not present

## 2023-01-15 DIAGNOSIS — H353132 Nonexudative age-related macular degeneration, bilateral, intermediate dry stage: Secondary | ICD-10-CM | POA: Diagnosis not present

## 2023-02-04 DIAGNOSIS — H2512 Age-related nuclear cataract, left eye: Secondary | ICD-10-CM | POA: Diagnosis not present

## 2023-02-10 ENCOUNTER — Telehealth: Payer: Self-pay

## 2023-02-10 ENCOUNTER — Encounter: Payer: Self-pay | Admitting: Ophthalmology

## 2023-02-10 ENCOUNTER — Ambulatory Visit (INDEPENDENT_AMBULATORY_CARE_PROVIDER_SITE_OTHER): Payer: Medicare Other | Admitting: Dermatology

## 2023-02-10 VITALS — BP 155/90

## 2023-02-10 DIAGNOSIS — L820 Inflamed seborrheic keratosis: Secondary | ICD-10-CM

## 2023-02-10 DIAGNOSIS — L719 Rosacea, unspecified: Secondary | ICD-10-CM

## 2023-02-10 NOTE — Progress Notes (Signed)
   Follow-Up Visit   Subjective  Mary Galvan is a 72 y.o. female who presents for the following: Rosacea of face - BBL today Patient is concerned about a spot on the skin she would like treated as it is irritating.  The following portions of the chart were reviewed this encounter and updated as appropriate: medications, allergies, medical history  Review of Systems:  No other skin or systemic complaints except as noted in HPI or Assessment and Plan.  Objective  Well appearing patient in no apparent distress; mood and affect are within normal limits.  A focused examination was performed of the following areas: Face  Relevant exam findings are noted in the Assessment and Plan.  Left cheek Erythematous stuck-on, waxy papule or plaque   Assessment & Plan   ROSACEA Exam Mid face erythema with telangiectasias of face  Rosacea is a chronic progressive skin condition usually affecting the face of adults, causing redness and/or acne bumps. It is treatable but not curable. It sometimes affects the eyes (ocular rosacea) as well. It may respond to topical and/or systemic medication and can flare with stress, sun exposure, alcohol, exercise, topical steroids (including hydrocortisone/cortisone 10) and some foods.  Daily application of broad spectrum spf 30+ sunscreen to face is recommended to reduce flares.        [place] BBL - 02/10/23 1800      Patient Details   Improvement from Previous Treatment: Yes      Treatment Details   Date: 02/10/23    Treatment #: 3    Area: Face    Filter: 1st Pass      1st Pass   Location: F    Device: 560    BBL j/cm2: 27    PW Msec Sec: 26    Cooling Temp: 15    Pulses: 69      2nd Pass   Location: F    Device: 560     Inflamed seborrheic keratosis Left cheek  Destruction of lesion - Left cheek Complexity: simple   Destruction method: cryotherapy   Informed consent: discussed and consent obtained   Timeout:  patient name, date  of birth, surgical site, and procedure verified Lesion destroyed using liquid nitrogen: Yes   Region frozen until ice ball extended beyond lesion: Yes   Outcome: patient tolerated procedure well with no complications   Post-procedure details: wound care instructions given     Return for Follow up as scheduled.  I, Joanie Coddington, CMA, am acting as scribe for Armida Sans, MD .  Documentation: I have reviewed the above documentation for accuracy and completeness, and I agree with the above.  Armida Sans, MD

## 2023-02-10 NOTE — Telephone Encounter (Signed)
Mary Galvan at 4:30 is having Cataract surgery Monday afternoon and BBL today. The office doing her cataract surgery told her she needed to make Mary Galvan aware and she is questioning should she keep appt here?  Per Mary Galvan: She had Laser with me before and it will be similar.  If she had no significant swelling or side effect, she can have laser today.  Otherwise she can schedule in the Fall.  Please review with her and let us know whether she is coming today.  Called patient back and she was concerned with the lighting from the laser effecting her eyes for her surgery next week. Advised patient she is provided with goggles approved with laser. With concerns regarding the light, she should advise nurse at the eye center of the actual procedure she is having done and confirm with them if she should keep treatment.

## 2023-02-10 NOTE — Anesthesia Preprocedure Evaluation (Addendum)
Anesthesia Evaluation  Patient identified by MRN, date of birth, ID band Patient awake    Reviewed: Allergy & Precautions, H&P , NPO status , Patient's Chart, lab work & pertinent test results  History of Anesthesia Complications (+) PONV, Family history of anesthesia reaction and history of anesthetic complications  Airway Mallampati: III  TM Distance: <3 FB Neck ROM: Full    Dental no notable dental hx.  Multiple upper caps:   Pulmonary neg pulmonary ROS, former smoker   Pulmonary exam normal breath sounds clear to auscultation       Cardiovascular hypertension, negative cardio ROS Normal cardiovascular exam+ Valvular Problems/Murmurs  Rhythm:Regular Rate:Normal     Neuro/Psych  Headaches, Seizures -,  PSYCHIATRIC DISORDERS Anxiety Depression    negative neurological ROS  negative psych ROS   GI/Hepatic negative GI ROS, Neg liver ROS,GERD  ,,  Endo/Other  negative endocrine ROS    Renal/GU Renal diseasenegative Renal ROS  negative genitourinary   Musculoskeletal negative musculoskeletal ROS (+) Arthritis ,    Abdominal   Peds negative pediatric ROS (+)  Hematology negative hematology ROS (+)   Anesthesia Other Findings Rosacea  Seizures (HCC) Heart murmur  Arthritis Fatty liver disease, nonalcoholic  Motion sickness GERD (gastroesophageal reflux disease) Anxiety Hyperlipidemia  Hypertension Vitreous detachment of left eye Seasonal allergies Depression Seasonal affective disorder (HCC) Kidney cysts  PONV (postoperative nausea and vomiting) Actinic keratosis  Basal cell carcinoma Dysplastic nevus  Ocular migraine Family history of adverse reaction to anesthesia     Reproductive/Obstetrics negative OB ROS                              Anesthesia Physical Anesthesia Plan  ASA: 3  Anesthesia Plan: MAC   Post-op Pain Management:    Induction: Intravenous  PONV Risk  Score and Plan:   Airway Management Planned: Natural Airway and Nasal Cannula  Additional Equipment:   Intra-op Plan:   Post-operative Plan:   Informed Consent: I have reviewed the patients History and Physical, chart, labs and discussed the procedure including the risks, benefits and alternatives for the proposed anesthesia with the patient or authorized representative who has indicated his/her understanding and acceptance.     Dental Advisory Given  Plan Discussed with: Anesthesiologist, CRNA and Surgeon  Anesthesia Plan Comments: (Patient consented for risks of anesthesia including but not limited to:  - adverse reactions to medications - damage to eyes, teeth, lips or other oral mucosa - nerve damage due to positioning  - sore throat or hoarseness - Damage to heart, brain, nerves, lungs, other parts of body or loss of life  Patient voiced understanding.)         Anesthesia Quick Evaluation

## 2023-02-10 NOTE — Patient Instructions (Signed)
Due to recent changes in healthcare laws, you may see results of your pathology and/or laboratory studies on MyChart before the doctors have had a chance to review them. We understand that in some cases there may be results that are confusing or concerning to you. Please understand that not all results are received at the same time and often the doctors may need to interpret multiple results in order to provide you with the best plan of care or course of treatment. Therefore, we ask that you please give us 2 business days to thoroughly review all your results before contacting the office for clarification. Should we see a critical lab result, you will be contacted sooner.   If You Need Anything After Your Visit  If you have any questions or concerns for your doctor, please call our main line at [phone] and press option 4 to reach your doctor's medical assistant. If no one answers, please leave a voicemail as directed and we will return your call as soon as possible. Messages left after 4 pm will be answered the following business day.   You may also send us a message via MyChart. We typically respond to MyChart messages within 1-2 business days.  For prescription refills, please ask your pharmacy to contact our office. Our fax number is [phone].  If you have an urgent issue when the clinic is closed that cannot wait until the next business day, you can page your doctor at the number below.    Please note that while we do our best to be available for urgent issues outside of office hours, we are not available 24/7.   If you have an urgent issue and are unable to reach us, you may choose to seek medical care at your doctor's office, retail clinic, urgent care center, or emergency room.  If you have a medical emergency, please immediately call 911 or go to the emergency department.  Pager Numbers  - Dr. Kowalski: [phone]  - Dr. Moye: [phone]  - Dr. Stewart:  [phone]  In the event of inclement weather, please call our main line at [phone] for an update on the status of any delays or closures.  Dermatology Medication Tips: Please keep the boxes that topical medications come in in order to help keep track of the instructions about where and how to use these. Pharmacies typically print the medication instructions only on the boxes and not directly on the medication tubes.   If your medication is too expensive, please contact our office at [phone] option 4 or send us a message through MyChart.   We are unable to tell what your co-pay for medications will be in advance as this is different depending on your insurance coverage. However, we may be able to find a substitute medication at lower cost or fill out paperwork to get insurance to cover a needed medication.   If a prior authorization is required to get your medication covered by your insurance company, please allow us 1-2 business days to complete this process.  Drug prices often vary depending on where the prescription is filled and some pharmacies may offer cheaper prices.  The website www.goodrx.com contains coupons for medications through different pharmacies. The prices here do not account for what the cost may be with help from insurance (it may be cheaper with your insurance), but the website can give you the price if you did not use any insurance.  - You can print the associated coupon and take it with   your prescription to the pharmacy.  - You may also stop by our office during regular business hours and pick up a GoodRx coupon card.  - If you need your prescription sent electronically to a different pharmacy, notify our office through [place] MyChart or by phone at [phone] option 4.     Si Usted Necesita Algo Despus de Su Visita  Tambin puede enviarnos un mensaje a travs de MyChart. Por lo general respondemos a los mensajes de MyChart en el transcurso de 1 a 2  das hbiles.  Para renovar recetas, por favor pida a su farmacia que se ponga en contacto con nuestra oficina. Nuestro nmero de fax es el [phone].  Si tiene un asunto urgente cuando la clnica est cerrada y que no puede esperar hasta el siguiente da hbil, puede llamar/localizar a su doctor(a) al nmero que aparece a continuacin.   Por favor, tenga en cuenta que aunque hacemos todo lo posible para estar disponibles para asuntos urgentes fuera del horario de oficina, no estamos disponibles las 24 horas del da, los 7 das de la semana.   Si tiene un problema urgente y no puede comunicarse con nosotros, puede optar por buscar atencin mdica  en el consultorio de su doctor(a), en una clnica privada, en un centro de atencin urgente o en una sala de emergencias.  Si tiene una emergencia mdica, por favor llame inmediatamente al 911 o vaya a la sala de emergencias.  Nmeros de bper  - Dr. Kowalski: [phone]  - Dra. Moye: [phone]  - Dra. Stewart: [phone]  En caso de inclemencias del tiempo, por favor llame a nuestra lnea principal al [phone] para una actualizacin sobre el estado de cualquier retraso o cierre.  Consejos para la medicacin en dermatologa: Por favor, guarde las cajas en las que vienen los medicamentos de uso tpico para ayudarle a seguir las instrucciones sobre dnde y cmo usarlos. Las farmacias generalmente imprimen las instrucciones del medicamento slo en las cajas y no directamente en los tubos del medicamento.   Si su medicamento es muy caro, por favor, pngase en contacto con nuestra oficina llamando al [phone] y presione la opcin 4 o envenos un mensaje a travs de MyChart.   No podemos decirle cul ser su copago por los medicamentos por adelantado ya que esto es diferente dependiendo de la cobertura de su seguro. Sin embargo, es posible que podamos encontrar un medicamento sustituto a menor costo o llenar un formulario para que el  seguro cubra el medicamento que se considera necesario.   Si se requiere una autorizacin previa para que su compaa de seguros cubra su medicamento, por favor permtanos de 1 a 2 das hbiles para completar este proceso.  Los precios de los medicamentos varan con frecuencia dependiendo del lugar de dnde se surte la receta y alguna farmacias pueden ofrecer precios ms baratos.  El sitio web www.goodrx.com tiene cupones para medicamentos de diferentes farmacias. Los precios aqu no tienen en cuenta lo que podra costar con la ayuda del seguro (puede ser ms barato con su seguro), pero el sitio web puede darle el precio si no utiliz ningn seguro.  - Puede imprimir el cupn correspondiente y llevarlo con su receta a la farmacia.  - Tambin puede pasar por nuestra oficina durante el horario de atencin regular y recoger una tarjeta de cupones de GoodRx.  - Si necesita que su receta se enve electrnicamente a una farmacia diferente, informe a nuestra oficina a travs de MyChart de [place]   o por telfono llamando al [phone] y presione la opcin 4.  

## 2023-02-11 NOTE — Discharge Instructions (Signed)

## 2023-02-15 ENCOUNTER — Encounter: Payer: Self-pay | Admitting: Dermatology

## 2023-02-16 ENCOUNTER — Ambulatory Visit
Admission: RE | Admit: 2023-02-16 | Discharge: 2023-02-16 | Disposition: A | Discharge status: Home / Self Care | Payer: Medicare Other | Attending: Ophthalmology | Admitting: Ophthalmology

## 2023-02-16 ENCOUNTER — Other Ambulatory Visit: Payer: Self-pay

## 2023-02-16 ENCOUNTER — Ambulatory Visit: Payer: Medicare Other | Admitting: Anesthesiology

## 2023-02-16 ENCOUNTER — Encounter
Admission: RE | Disposition: A | Discharge status: Home / Self Care | Payer: Self-pay | Source: Home / Self Care | Attending: Ophthalmology

## 2023-02-16 ENCOUNTER — Encounter: Payer: Self-pay | Admitting: Ophthalmology

## 2023-02-16 DIAGNOSIS — H2512 Age-related nuclear cataract, left eye: Secondary | ICD-10-CM | POA: Insufficient documentation

## 2023-02-16 DIAGNOSIS — Z87891 Personal history of nicotine dependence: Secondary | ICD-10-CM | POA: Insufficient documentation

## 2023-02-16 DIAGNOSIS — I10 Essential (primary) hypertension: Secondary | ICD-10-CM | POA: Diagnosis not present

## 2023-02-16 HISTORY — PX: CATARACT EXTRACTION W/PHACO: SHX586

## 2023-02-16 SURGERY — PHACOEMULSIFICATION, CATARACT, WITH IOL INSERTION
Anesthesia: Monitor Anesthesia Care | Site: Eye | Laterality: Left

## 2023-02-16 MED ORDER — MIDAZOLAM HCL 2 MG/2ML IJ SOLN
INTRAMUSCULAR | Status: DC | PRN
  Administered 2023-02-16: 2 mg via INTRAVENOUS

## 2023-02-16 MED ORDER — MOXIFLOXACIN HCL 0.5 % OP SOLN
OPHTHALMIC | Status: DC | PRN
  Administered 2023-02-16: .2 mL via OPHTHALMIC

## 2023-02-16 MED ORDER — SIGHTPATH DOSE#1 NA HYALUR & NA CHOND-NA HYALUR IO KIT
PACK | INTRAOCULAR | Status: DC | PRN
  Administered 2023-02-16: 1 via OPHTHALMIC

## 2023-02-16 MED ORDER — LIDOCAINE HCL (PF) 2 % IJ SOLN
INTRAOCULAR | Status: DC | PRN
  Administered 2023-02-16: 1 mL via INTRAOCULAR

## 2023-02-16 MED ORDER — TETRACAINE HCL 0.5 % OP SOLN
1.0000 [drp] | OPHTHALMIC | Status: DC | PRN
  Administered 2023-02-16 (×3): 1 [drp] via OPHTHALMIC

## 2023-02-16 MED ORDER — LACTATED RINGERS IV SOLN: INTRAVENOUS | Status: DC

## 2023-02-16 MED ORDER — SIGHTPATH DOSE#1 BSS IO SOLN
INTRAOCULAR | Status: DC | PRN
  Administered 2023-02-16: 15 mL

## 2023-02-16 MED ORDER — FENTANYL CITRATE (PF) 100 MCG/2ML IJ SOLN
INTRAMUSCULAR | Status: DC | PRN
  Administered 2023-02-16 (×2): 50 ug via INTRAVENOUS

## 2023-02-16 MED ORDER — ARMC OPHTHALMIC DILATING DROPS
1.0000 | OPHTHALMIC | Status: DC | PRN
  Administered 2023-02-16 (×3): 1 via OPHTHALMIC

## 2023-02-16 MED ORDER — SIGHTPATH DOSE#1 BSS IO SOLN
INTRAOCULAR | Status: DC | PRN
  Administered 2023-02-16: 72 mL via OPHTHALMIC

## 2023-02-16 SURGICAL SUPPLY — 18 items
CANNULA ANT/CHMB 27G (MISCELLANEOUS) IMPLANT
CANNULA ANT/CHMB 27GA (MISCELLANEOUS) IMPLANT
CATARACT SUITE SIGHTPATH (MISCELLANEOUS) ×1 IMPLANT
DISSECTOR HYDRO NUCLEUS 50X22 (MISCELLANEOUS) ×1 IMPLANT
FEE CATARACT SUITE SIGHTPATH (MISCELLANEOUS) ×1 IMPLANT
GLOVE SURG GAMMEX PI TX LF 7.5 (GLOVE) ×1 IMPLANT
GLOVE SURG SYN 8.5  E (GLOVE) ×1
GLOVE SURG SYN 8.5 E (GLOVE) ×1 IMPLANT
GLOVE SURG SYN 8.5 PF PI (GLOVE) ×1 IMPLANT
LENS IOL TECNIS EYHANCE 14.5 (Intraocular Lens) IMPLANT
NDL FILTER BLUNT 18X1 1/2 (NEEDLE) ×1 IMPLANT
NEEDLE FILTER BLUNT 18X1 1/2 (NEEDLE) ×1 IMPLANT
PACK VIT ANT 23G (MISCELLANEOUS) IMPLANT
RING MALYGIN (MISCELLANEOUS) IMPLANT
SUT ETHILON 10-0 CS-B-6CS-B-6 (SUTURE)
SUTURE EHLN 10-0 CS-B-6CS-B-6 (SUTURE) IMPLANT
SYR 3ML LL SCALE MARK (SYRINGE) ×1 IMPLANT
SYR 5ML LL (SYRINGE) ×1 IMPLANT

## 2023-02-16 NOTE — Op Note (Signed)
OPERATIVE NOTE  JENNESIS BLAISDELL [id number] 02/16/2023   PREOPERATIVE DIAGNOSIS:  Nuclear sclerotic cataract left eye.  H25.12   POSTOPERATIVE DIAGNOSIS:    Nuclear sclerotic cataract left eye.     PROCEDURE:  Phacoemusification with posterior chamber intraocular lens placement of the left eye   LENS:   Implant Name Type Inv. Item Serial No. Manufacturer Lot No. LRB No. Used Action  LENS IOL TECNIS EYHANCE 14.5 - [id number] Intraocular Lens LENS IOL TECNIS EYHANCE 14.5 [id number] [employer]  Left 1 Implanted      Procedure(s) with comments: CATARACT EXTRACTION PHACO AND INTRAOCULAR LENS PLACEMENT (IOC) LEFT (Left) - 4.01 0:23.2  DIB00 +14.5   ULTRASOUND TIME: 0 minutes 23 seconds.  CDE 4.01   SURGEON:  Willey Blade, MD, MPH   ANESTHESIA:  Topical with tetracaine drops augmented with 1% preservative-free intracameral lidocaine.  ESTIMATED BLOOD LOSS: <1 mL   COMPLICATIONS:  None.   DESCRIPTION OF PROCEDURE:  The patient was identified in the holding room and transported to the operating room and placed in the supine position under the operating microscope.  The left eye was identified as the operative eye and it was prepped and draped in the usual sterile ophthalmic fashion.   A 1.0 millimeter clear-corneal paracentesis was made at the 5:00 position. 0.5 ml of preservative-free 1% lidocaine with epinephrine was injected into the anterior chamber.  The anterior chamber was filled with viscoelastic.  A 2.4 millimeter keratome was used to make a near-clear corneal incision at the 2:00 position.  A curvilinear capsulorrhexis was made with a cystotome and capsulorrhexis forceps.  Balanced salt solution was used to hydrodissect and hydrodelineate the nucleus.   Phacoemulsification was then used in stop and chop fashion to remove the lens nucleus and epinucleus.  The remaining cortex was then removed using the irrigation and aspiration handpiece. Viscoelastic was then placed into the  capsular bag to distend it for lens placement.  A lens was then injected into the capsular bag.  The remaining viscoelastic was aspirated.   Wounds were hydrated with balanced salt solution.  The anterior chamber was inflated to a physiologic pressure with balanced salt solution.  Intracameral vigamox 0.1 mL undiltued was injected into the eye and a drop placed onto the ocular surface.  No wound leaks were noted.  The patient was taken to the recovery room in stable condition without complications of anesthesia or surgery  Willey Blade 02/16/2023, 11:31 AM

## 2023-02-16 NOTE — Transfer of Care (Signed)
Immediate Anesthesia Transfer of Care Note  Patient: Mary Galvan  Procedure(s) Performed: CATARACT EXTRACTION PHACO AND INTRAOCULAR LENS PLACEMENT (IOC) LEFT (Left: Eye)  Patient Location: PACU  Anesthesia Type: MAC  Level of Consciousness: awake, alert  and patient cooperative  Airway and Oxygen Therapy: Patient Spontanous Breathing and Patient connected to supplemental oxygen  Post-op Assessment: Post-op Vital signs reviewed, Patient's Cardiovascular Status Stable, Respiratory Function Stable, Patent Airway and No signs of Nausea or vomiting  Post-op Vital Signs: Reviewed and stable  Complications: No notable events documented.

## 2023-02-16 NOTE — H&P (Signed)
[facility]   Primary Care Physician:  Dorcas Carrow, DO Ophthalmologist: Dr. Willey Blade  Pre-Procedure History & Physical: HPI:  Mary Galvan is a 72 y.o. female here for cataract surgery.   Past Medical History:  Diagnosis Date   Actinic keratosis    Anxiety    Arthritis    thumbs   Basal cell carcinoma 10/20/2017   just under left brow   Depression    Dysplastic nevus 01/19/2018   right distal dorsum foot   Family history of adverse reaction to anesthesia    Fatty liver disease, nonalcoholic    GERD (gastroesophageal reflux disease)    Heart murmur    Hyperlipidemia    Hypertension    Kidney cysts    Motion sickness    reading in cars   Ocular migraine    PONV (postoperative nausea and vomiting)    slow to wake   Rosacea    Seasonal affective disorder (HCC)    Seasonal allergies    Seizures (HCC)    x1 - after head trauma. 1970's   Vitreous detachment of left eye 04/2011   [facility]    Past Surgical History:  Procedure Laterality Date   BASAL CELL CARCINOMA EXCISION Left    removed from above left eye    BROW LIFT Bilateral 05/02/2022   Procedure: BLEPHAROPLASTY UPPER EYELID; W/EXCESS SKIN BROW PTOSIS REPAIR BILATERAL;  Surgeon: Imagene Riches, MD;  Location: [facility];  Service: Ophthalmology;  Laterality: Bilateral;   CHOLECYSTECTOMY  2015   COLONOSCOPY WITH PROPOFOL N/A 05/10/2015   Procedure: COLONOSCOPY WITH PROPOFOL;  Surgeon: Midge Minium, MD;  Location: [facility];  Service: Endoscopy;  Laterality: N/A;  WITH BIOPSY-- SIGMOID COLON POLYP  X  4 DESCENDING COLON POLYP   COLONOSCOPY WITH PROPOFOL N/A 11/05/2021   Procedure: COLONOSCOPY WITH PROPOFOL;  Surgeon: Toney Reil, MD;  Location: [facility];  Service: Gastroenterology;  Laterality: N/A;   DILATATION & CURETTAGE/HYSTEROSCOPY WITH MYOSURE N/A 03/28/2016   Procedure: DILATATION & CURETTAGE/HYSTEROSCOPY WITH MYOSURE;  Surgeon: Suzy Bouchard,  MD;  Location: [facility];  Service: Gynecology;  Laterality: N/A;   ESOPHAGOGASTRODUODENOSCOPY (EGD) WITH PROPOFOL N/A 09/02/2017   Procedure: ESOPHAGOGASTRODUODENOSCOPY (EGD) WITH PROPOFOL;  Surgeon: Toney Reil, MD;  Location: [facility];  Service: Endoscopy;  Laterality: N/A;   MELANOMA EXCISION     pre melenoma excision on right foot    OTHER SURGICAL HISTORY     Uterine Polyp    Prior to Admission medications   Medication Sig Start Date End Date Taking? Authorizing Provider  Cholecalciferol (VITAMIN D3) 2000 UNITS TABS Take 2,000 Units by mouth daily.    Yes [provider]  Multiple Vitamins-Minerals (PRESERVISION AREDS 2 PO) Take 1 tablet by mouth 2 (two) times daily.   Yes [provider]  NONFORMULARY OR COMPOUNDED ITEM daily. Azelaic Acid/metronidazole/ivermectin  topical   Yes [provider]  doxycycline (PERIOSTAT) 20 MG tablet Take 1 tablet (20 mg total) by mouth daily. With food and plenty of fluid 11/12/22 03/12/23  Deirdre Evener, MD    Allergies as of 02/02/2023 - Review Complete 12/16/2022  Allergen Reaction Noted   Insect extract Itching and Swelling 09/02/2022    Family History  Problem Relation Age of Onset   Cancer Mother        possible lung?   Dementia Mother    Cancer Sister        kidney   Diabetes Sister  COPD Sister    Cancer Paternal Grandfather        stomach and prostate   Diabetes Sister    Heart disease Sister    Hypertension Sister    COPD Sister    Stroke Maternal Uncle    Breast cancer Neg Hx     Social History   Socioeconomic History   Marital status: Married    Spouse name: Not on file   Number of children: Not on file   Years of education: Not on file   Highest education level: High school graduate  Occupational History   Occupation: retired  Tobacco Use   Smoking status: Former    Packs/day: 1.00    Years: 30.00    Additional pack years: 0.00    Total pack years: 30.00     Types: Cigarettes    Quit date: 03/13/2000    Years since quitting: 22.9   Smokeless tobacco: Never  Vaping Use   Vaping Use: Never used  Substance and Sexual Activity   Alcohol use: Yes    Comment: occasional   Drug use: No   Sexual activity: Not Currently    Partners: Male  Other Topics Concern   Not on file  Social History Narrative   Not on file   Social Determinants of Health   Financial Resource Strain: Low Risk  (12/09/2022)   Overall Financial Resource Strain (CARDIA)    Difficulty of Paying Living Expenses: Not hard at all  Food Insecurity: No Food Insecurity (12/09/2022)   Hunger Vital Sign    Worried About Running Out of Food in the Last Year: Never true    Ran Out of Food in the Last Year: Never true  Transportation Needs: No Transportation Needs (12/09/2022)   PRAPARE - Administrator, Civil Service (Medical): No    Lack of Transportation (Non-Medical): No  Physical Activity: Insufficiently Active (12/09/2022)   Exercise Vital Sign    Days of Exercise per Week: 7 days    Minutes of Exercise per Session: 20 min  Stress: No Stress Concern Present (12/09/2022)   [employer] of Occupational Health - Occupational Stress Questionnaire    Feeling of Stress : Not at all  Social Connections: Moderately Isolated (12/09/2022)   Social Connection and Isolation Panel [NHANES]    Frequency of Communication with Friends and Family: More than three times a week    Frequency of Social Gatherings with Friends and Family: More than three times a week    Attends Religious Services: Never    Database administrator or Organizations: No    Attends Banker Meetings: Never    Marital Status: Married  Catering manager Violence: Not At Risk (12/09/2022)   Humiliation, Afraid, Rape, and Kick questionnaire    Fear of Current or Ex-Partner: No    Emotionally Abused: No    Physically Abused: No    Sexually Abused: No    Review of Systems: See HPI, otherwise  negative ROS  Physical Exam: BP (!) 149/80   Temp 98.1 F (36.7 C) (Temporal)   Resp 13   Ht 5' 3.5" (1.613 m)   Wt 82.7 kg   SpO2 95%   BMI 31.80 kg/m  General:   Alert, cooperative in NAD Head:  Normocephalic and atraumatic. Respiratory:  Normal work of breathing. Cardiovascular:  RRR  Impression/Plan: Mary Galvan is here for cataract surgery.  Risks, benefits, limitations, and alternatives regarding cataract surgery have been reviewed with the patient.  Questions have been answered.  All parties agreeable.   Willey Blade, MD  02/16/2023, 10:59 AM

## 2023-02-16 NOTE — Anesthesia Postprocedure Evaluation (Signed)
Anesthesia Post Note  Patient: Mary Galvan  Procedure(s) Performed: CATARACT EXTRACTION PHACO AND INTRAOCULAR LENS PLACEMENT (IOC) LEFT (Left: Eye)  Patient location during evaluation: PACU Anesthesia Type: MAC Level of consciousness: awake and alert Pain management: pain level controlled Vital Signs Assessment: post-procedure vital signs reviewed and stable Respiratory status: spontaneous breathing, nonlabored ventilation, respiratory function stable and patient connected to nasal cannula oxygen Cardiovascular status: stable and blood pressure returned to baseline Postop Assessment: no apparent nausea or vomiting Anesthetic complications: no   No notable events documented.   Last Vitals:  Vitals:   02/16/23 1132 02/16/23 1137  BP: 132/76 (!) 141/75  Pulse: 65 65  Resp: 13 16  Temp: (!) 36.3 C (!) 36.3 C  SpO2: 93% 94%    Last Pain:  Vitals:   02/16/23 1137  TempSrc:   PainSc: 0-No pain                 Braxden Lovering C Amanii Snethen

## 2023-02-17 ENCOUNTER — Encounter: Payer: Self-pay | Admitting: Ophthalmology

## 2023-02-17 DIAGNOSIS — H2511 Age-related nuclear cataract, right eye: Secondary | ICD-10-CM | POA: Diagnosis not present

## 2023-02-27 NOTE — Discharge Instructions (Signed)

## 2023-03-02 ENCOUNTER — Encounter: Payer: Self-pay | Admitting: Ophthalmology

## 2023-03-02 ENCOUNTER — Encounter
Admission: RE | Disposition: A | Discharge status: Home / Self Care | Payer: Self-pay | Source: Home / Self Care | Attending: Ophthalmology

## 2023-03-02 ENCOUNTER — Ambulatory Visit: Payer: Medicare Other | Admitting: Anesthesiology

## 2023-03-02 ENCOUNTER — Ambulatory Visit
Admission: RE | Admit: 2023-03-02 | Discharge: 2023-03-02 | Disposition: A | Discharge status: Home / Self Care | Payer: Medicare Other | Attending: Ophthalmology | Admitting: Ophthalmology

## 2023-03-02 ENCOUNTER — Other Ambulatory Visit: Payer: Self-pay

## 2023-03-02 DIAGNOSIS — I10 Essential (primary) hypertension: Secondary | ICD-10-CM | POA: Diagnosis not present

## 2023-03-02 DIAGNOSIS — K219 Gastro-esophageal reflux disease without esophagitis: Secondary | ICD-10-CM | POA: Insufficient documentation

## 2023-03-02 DIAGNOSIS — L719 Rosacea, unspecified: Secondary | ICD-10-CM | POA: Insufficient documentation

## 2023-03-02 DIAGNOSIS — Z09 Encounter for follow-up examination after completed treatment for conditions other than malignant neoplasm: Secondary | ICD-10-CM | POA: Diagnosis not present

## 2023-03-02 DIAGNOSIS — H2511 Age-related nuclear cataract, right eye: Secondary | ICD-10-CM | POA: Diagnosis not present

## 2023-03-02 DIAGNOSIS — H269 Unspecified cataract: Secondary | ICD-10-CM | POA: Diagnosis not present

## 2023-03-02 DIAGNOSIS — Z87891 Personal history of nicotine dependence: Secondary | ICD-10-CM | POA: Insufficient documentation

## 2023-03-02 HISTORY — PX: CATARACT EXTRACTION W/PHACO: SHX586

## 2023-03-02 SURGERY — PHACOEMULSIFICATION, CATARACT, WITH IOL INSERTION
Anesthesia: Monitor Anesthesia Care | Laterality: Right

## 2023-03-02 MED ORDER — LACTATED RINGERS IV SOLN: INTRAVENOUS | Status: DC

## 2023-03-02 MED ORDER — TETRACAINE HCL 0.5 % OP SOLN
1.0000 [drp] | OPHTHALMIC | Status: DC | PRN
  Administered 2023-03-02 (×3): 1 [drp] via OPHTHALMIC

## 2023-03-02 MED ORDER — MIDAZOLAM HCL 2 MG/2ML IJ SOLN
INTRAMUSCULAR | Status: DC | PRN
  Administered 2023-03-02 (×2): 1 mg via INTRAVENOUS

## 2023-03-02 MED ORDER — LIDOCAINE HCL (PF) 2 % IJ SOLN
INTRAOCULAR | Status: DC | PRN
  Administered 2023-03-02: 4 mL via INTRAOCULAR

## 2023-03-02 MED ORDER — SIGHTPATH DOSE#1 BSS IO SOLN
INTRAOCULAR | Status: DC | PRN
  Administered 2023-03-02: 71 mL via OPHTHALMIC

## 2023-03-02 MED ORDER — ARMC OPHTHALMIC DILATING DROPS
1.0000 | OPHTHALMIC | Status: DC | PRN
  Administered 2023-03-02 (×3): 1 via OPHTHALMIC

## 2023-03-02 MED ORDER — FENTANYL CITRATE (PF) 100 MCG/2ML IJ SOLN
INTRAMUSCULAR | Status: DC | PRN
  Administered 2023-03-02: 25 ug via INTRAVENOUS

## 2023-03-02 MED ORDER — SIGHTPATH DOSE#1 BSS IO SOLN
INTRAOCULAR | Status: DC | PRN
  Administered 2023-03-02: 15 mL via INTRAOCULAR

## 2023-03-02 MED ORDER — MOXIFLOXACIN HCL 0.5 % OP SOLN
OPHTHALMIC | Status: DC | PRN
  Administered 2023-03-02: .2 mL

## 2023-03-02 MED ORDER — SIGHTPATH DOSE#1 NA HYALUR & NA CHOND-NA HYALUR IO KIT
PACK | INTRAOCULAR | Status: DC | PRN
  Administered 2023-03-02: 1

## 2023-03-02 SURGICAL SUPPLY — 14 items
CATARACT SUITE SIGHTPATH (MISCELLANEOUS) ×1 IMPLANT
DISSECTOR HYDRO NUCLEUS 50X22 (MISCELLANEOUS) ×1 IMPLANT
FEE CATARACT SUITE SIGHTPATH (MISCELLANEOUS) ×1 IMPLANT
GLOVE SURG GAMMEX PI TX LF 7.5 (GLOVE) ×1 IMPLANT
GLOVE SURG SYN 8.5  E (GLOVE) ×1
GLOVE SURG SYN 8.5 E (GLOVE) ×1 IMPLANT
GLOVE SURG SYN 8.5 PF PI (GLOVE) ×1 IMPLANT
LENS IOL EYHANCE TORIC II 15.0 ×1 IMPLANT
LENS IOL EYHANCE TRC 300 15.0 IMPLANT
LENS IOL EYHNC TORIC 300 15.0 ×1 IMPLANT
NDL FILTER BLUNT 18X1 1/2 (NEEDLE) ×1 IMPLANT
NEEDLE FILTER BLUNT 18X1 1/2 (NEEDLE) ×1 IMPLANT
SYR 3ML LL SCALE MARK (SYRINGE) ×1 IMPLANT
SYR 5ML LL (SYRINGE) ×1 IMPLANT

## 2023-03-02 NOTE — Anesthesia Preprocedure Evaluation (Signed)
Anesthesia Evaluation  Patient identified by MRN, date of birth, ID band Patient awake    Reviewed: Allergy & Precautions, H&P , NPO status , Patient's Chart, lab work & pertinent test results  History of Anesthesia Complications (+) PONV, Family history of anesthesia reaction and history of anesthetic complications  Airway Mallampati: III  TM Distance: >3 FB Neck ROM: Full    Dental no notable dental hx.    Pulmonary neg pulmonary ROS, former smoker   Pulmonary exam normal breath sounds clear to auscultation       Cardiovascular hypertension, negative cardio ROS Normal cardiovascular exam+ Valvular Problems/Murmurs  Rhythm:Regular Rate:Normal     Neuro/Psych  Headaches, Seizures -,  PSYCHIATRIC DISORDERS Anxiety Depression       GI/Hepatic negative GI ROS, Neg liver ROS,GERD  ,,  Endo/Other  negative endocrine ROS    Renal/GU Renal diseasenegative Renal ROS  negative genitourinary   Musculoskeletal  (+) Arthritis ,    Abdominal   Peds negative pediatric ROS (+)  Hematology negative hematology ROS (+)   Anesthesia Other Findings Rosacea  Seizures (HCC) Heart murmur  Arthritis Fatty liver disease, nonalcoholic  Motion sickness GERD (gastroesophageal reflux disease) Anxiety Hyperlipidemia  Hypertension Vitreous detachment of left eye Seasonal allergies Depression  Seasonal affective disorder (HCC) Kidney cysts  PONV (postoperative nausea and vomiting) Actinic keratosis  Basal cell carcinoma Dysplastic nevus  Ocular migraine    Reproductive/Obstetrics negative OB ROS                              Anesthesia Physical Anesthesia Plan  ASA: 3  Anesthesia Plan: MAC   Post-op Pain Management:    Induction: Intravenous  PONV Risk Score and Plan:   Airway Management Planned: Natural Airway and Nasal Cannula  Additional Equipment:   Intra-op Plan:   Post-operative  Plan:   Informed Consent: I have reviewed the patients History and Physical, chart, labs and discussed the procedure including the risks, benefits and alternatives for the proposed anesthesia with the patient or authorized representative who has indicated his/her understanding and acceptance.     Dental Advisory Given  Plan Discussed with: Anesthesiologist, CRNA and Surgeon  Anesthesia Plan Comments: (Patient consented for risks of anesthesia including but not limited to:  - adverse reactions to medications - damage to eyes, teeth, lips or other oral mucosa - nerve damage due to positioning  - sore throat or hoarseness - Damage to heart, brain, nerves, lungs, other parts of body or loss of life  Patient voiced understanding.)         Anesthesia Quick Evaluation

## 2023-03-02 NOTE — H&P (Signed)
[facility]   Primary Care Physician:  Dorcas Carrow, DO Ophthalmologist: Dr. Willey Blade  Pre-Procedure History & Physical: HPI:  Mary Galvan is a 72 y.o. female here for cataract surgery.   Past Medical History:  Diagnosis Date   Actinic keratosis    Anxiety    Arthritis    thumbs   Basal cell carcinoma 10/20/2017   just under left brow   Depression    Dysplastic nevus 01/19/2018   right distal dorsum foot   Family history of adverse reaction to anesthesia    Fatty liver disease, nonalcoholic    GERD (gastroesophageal reflux disease)    Heart murmur    Hyperlipidemia    Hypertension    Kidney cysts    Motion sickness    reading in cars   Ocular migraine    PONV (postoperative nausea and vomiting)    slow to wake   Rosacea    Seasonal affective disorder (HCC)    Seasonal allergies    Seizures (HCC)    x1 - after head trauma. 1970's   Vitreous detachment of left eye 04/2011   [facility]    Past Surgical History:  Procedure Laterality Date   BASAL CELL CARCINOMA EXCISION Left    removed from above left eye    BROW LIFT Bilateral 05/02/2022   Procedure: BLEPHAROPLASTY UPPER EYELID; W/EXCESS SKIN BROW PTOSIS REPAIR BILATERAL;  Surgeon: Imagene Riches, MD;  Location: [facility];  Service: Ophthalmology;  Laterality: Bilateral;   CATARACT EXTRACTION W/PHACO Left 02/16/2023   Procedure: CATARACT EXTRACTION PHACO AND INTRAOCULAR LENS PLACEMENT (IOC) LEFT;  Surgeon: Nevada Crane, MD;  Location: [facility];  Service: Ophthalmology;  Laterality: Left;  4.01 0:23.2   CHOLECYSTECTOMY  2015   COLONOSCOPY WITH PROPOFOL N/A 05/10/2015   Procedure: COLONOSCOPY WITH PROPOFOL;  Surgeon: Midge Minium, MD;  Location: [facility];  Service: Endoscopy;  Laterality: N/A;  WITH BIOPSY-- SIGMOID COLON POLYP  X  4 DESCENDING COLON POLYP   COLONOSCOPY WITH PROPOFOL N/A 11/05/2021   Procedure: COLONOSCOPY WITH PROPOFOL;  Surgeon: Toney Reil, MD;  Location: [facility];  Service: Gastroenterology;  Laterality: N/A;   DILATATION & CURETTAGE/HYSTEROSCOPY WITH MYOSURE N/A 03/28/2016   Procedure: DILATATION & CURETTAGE/HYSTEROSCOPY WITH MYOSURE;  Surgeon: Suzy Bouchard, MD;  Location: [facility] OR[facility];  Service: Gynecology;  Laterality: N/A;   ESOPHAGOGASTRODUODENOSCOPY (EGD) WITH PROPOFOL N/A 09/02/2017   Procedure: ESOPHAGOGASTRODUODENOSCOPY (EGD) WITH PROPOFOL;  Surgeon: Toney Reil, MD;  Location: [facility];  Service: Endoscopy;  Laterality: N/A;   MELANOMA EXCISION     pre melenoma excision on right foot    OTHER SURGICAL HISTORY     Uterine Polyp    Prior to Admission medications   Medication Sig Start Date End Date Taking? Authorizing Provider  Cholecalciferol (VITAMIN D3) 2000 UNITS TABS Take 2,000 Units by mouth daily.    Yes [provider]  doxycycline (PERIOSTAT) 20 MG tablet Take 1 tablet (20 mg total) by mouth daily. With food and plenty of fluid 11/12/22 03/12/23 Yes Deirdre Evener, MD  hydrochlorothiazide (HYDRODIURIL) 25 MG tablet Take 25 mg by mouth daily.   Yes [provider]  Multiple Vitamins-Minerals (PRESERVISION AREDS 2 PO) Take 1 tablet by mouth 2 (two) times daily.   Yes [provider]  NONFORMULARY OR COMPOUNDED ITEM daily. Azelaic Acid/metronidazole/ivermectin  topical   Yes [provider]    Allergies as of 02/02/2023 - Review Complete 12/16/2022  Allergen Reaction Noted   Insect extract Itching and Swelling 09/02/2022    Family History  Problem Relation Age of Onset   Cancer Mother        possible lung?   Dementia Mother    Cancer Sister        kidney   Diabetes Sister    COPD Sister    Cancer Paternal Grandfather        stomach and prostate   Diabetes Sister    Heart disease Sister    Hypertension Sister    COPD Sister    Stroke Maternal Uncle    Breast cancer Neg Hx     Social History   Socioeconomic History    Marital status: Married    Spouse name: Not on file   Number of children: Not on file   Years of education: Not on file   Highest education level: High school graduate  Occupational History   Occupation: retired  Tobacco Use   Smoking status: Former    Packs/day: 1.00    Years: 30.00    Additional pack years: 0.00    Total pack years: 30.00    Types: Cigarettes    Quit date: 03/13/2000    Years since quitting: 22.9   Smokeless tobacco: Never  Vaping Use   Vaping Use: Never used  Substance and Sexual Activity   Alcohol use: Yes    Comment: occasional   Drug use: No   Sexual activity: Not Currently    Partners: Male  Other Topics Concern   Not on file  Social History Narrative   Not on file   Social Determinants of Health   Financial Resource Strain: Low Risk  (12/09/2022)   Overall Financial Resource Strain (CARDIA)    Difficulty of Paying Living Expenses: Not hard at all  Food Insecurity: No Food Insecurity (12/09/2022)   Hunger Vital Sign    Worried About Running Out of Food in the Last Year: Never true    Ran Out of Food in the Last Year: Never true  Transportation Needs: No Transportation Needs (12/09/2022)   PRAPARE - Administrator, Civil Service (Medical): No    Lack of Transportation (Non-Medical): No  Physical Activity: Insufficiently Active (12/09/2022)   Exercise Vital Sign    Days of Exercise per Week: 7 days    Minutes of Exercise per Session: 20 min  Stress: No Stress Concern Present (12/09/2022)   [employer] of Occupational Health - Occupational Stress Questionnaire    Feeling of Stress : Not at all  Social Connections: Moderately Isolated (12/09/2022)   Social Connection and Isolation Panel [NHANES]    Frequency of Communication with Friends and Family: More than three times a week    Frequency of Social Gatherings with Friends and Family: More than three times a week    Attends Religious Services: Never    Database administrator or  Organizations: No    Attends Banker Meetings: Never    Marital Status: Married  Catering manager Violence: Not At Risk (12/09/2022)   Humiliation, Afraid, Rape, and Kick questionnaire    Fear of Current or Ex-Partner: No    Emotionally Abused: No    Physically Abused: No    Sexually Abused: No    Review of Systems: See HPI, otherwise negative ROS  Physical Exam: BP (!) 158/95   Temp 97.8 F (36.6 C) (Temporal)   Resp 18   Ht 5' 3.5" (1.613 m)  Wt 81.7 kg   SpO2 95%   BMI 31.42 kg/m  General:   Alert, cooperative in NAD Head:  Normocephalic and atraumatic. Respiratory:  Normal work of breathing. Cardiovascular:  RRR  Impression/Plan: AIDALY ESCATEL is here for cataract surgery.  Risks, benefits, limitations, and alternatives regarding cataract surgery have been reviewed with the patient.  Questions have been answered.  All parties agreeable.   Willey Blade, MD  03/02/2023, 11:10 AM

## 2023-03-02 NOTE — Transfer of Care (Signed)
Immediate Anesthesia Transfer of Care Note  Patient: Mary Galvan  Procedure(s) Performed: CATARACT EXTRACTION PHACO AND INTRAOCULAR LENS PLACEMENT (IOC) RIGHT eyhance toric 2.46 00:25.3 (Right)  Patient Location: PACU  Anesthesia Type: MAC  Level of Consciousness: awake, alert  and patient cooperative  Airway and Oxygen Therapy: Patient Spontanous Breathing and Patient connected to supplemental oxygen  Post-op Assessment: Post-op Vital signs reviewed, Patient's Cardiovascular Status Stable, Respiratory Function Stable, Patent Airway and No signs of Nausea or vomiting  Post-op Vital Signs: Reviewed and stable  Complications: No notable events documented.

## 2023-03-02 NOTE — Op Note (Signed)
OPERATIVE NOTE  Mary Galvan [id number] 03/02/2023   PREOPERATIVE DIAGNOSIS:  Nuclear sclerotic cataract right eye.  H25.11   POSTOPERATIVE DIAGNOSIS:    Nuclear sclerotic cataract right eye.     PROCEDURE:  Phacoemusification with posterior chamber intraocular lens placement of the right eye   LENS:   Implant Name Type Inv. Item Serial No. Manufacturer Lot No. LRB No. Used Action  LENS IOL EYHANCE TORIC II 15.0 - [id number]  LENS IOL Kizzie Bane II 15.0 [id number] [employer]  Right 1 Implanted       Procedure(s): CATARACT EXTRACTION PHACO AND INTRAOCULAR LENS PLACEMENT (IOC) RIGHT eyhance toric 2.46 00:25.3 (Right)  [id number] +15.0   ULTRASOUND TIME: 0 minutes 25 seconds.  CDE 2.46   SURGEON:  Willey Blade, MD, MPH  ANESTHESIOLOGIST: Anesthesiologist: Marisue Humble, MD CRNA: Omer Jack, CRNA   ANESTHESIA:  Topical with tetracaine drops augmented with 1% preservative-free intracameral lidocaine.  ESTIMATED BLOOD LOSS: less than 1 mL.   COMPLICATIONS:  None.   DESCRIPTION OF PROCEDURE:  The patient was identified in the holding room and transported to the operating room and placed in the supine position under the operating microscope.  The right eye was identified as the operative eye and it was prepped and draped in the usual sterile ophthalmic fashion.  The verion system was registered without difficulty.   A 1.0 millimeter clear-corneal paracentesis was made at the 10:30 position. 0.5 ml of preservative-free 1% lidocaine with epinephrine was injected into the anterior chamber.  The anterior chamber was filled with viscoelastic.  A 2.4 millimeter keratome was used to make a near-clear corneal incision at the 8:00 position.  A curvilinear capsulorrhexis was made with a cystotome and capsulorrhexis forceps.  Balanced salt solution was used to hydrodissect and hydrodelineate the nucleus.   Phacoemulsification was then used in stop and chop fashion to remove the  lens nucleus and epinucleus.  The remaining cortex was then removed using the irrigation and aspiration handpiece. Viscoelastic was then placed into the capsular bag to distend it for lens placement.  A lens was then injected into the capsular bag.  The remaining viscoelastic was aspirated.  The lens was rotated with guidance from the verion system.   Wounds were hydrated with balanced salt solution.  The anterior chamber was inflated to a physiologic pressure with balanced salt solution. The lens was well centered.  Intracameral vigamox 0.1 mL undiluted was injected into the eye and a drop placed onto the ocular surface.  No wound leaks were noted.  The patient was taken to the recovery room in stable condition without complications of anesthesia or surgery  Willey Blade 03/02/2023, 11:38 AM

## 2023-03-02 NOTE — Anesthesia Postprocedure Evaluation (Signed)
Anesthesia Post Note  Patient: Mary Galvan  Procedure(s) Performed: CATARACT EXTRACTION PHACO AND INTRAOCULAR LENS PLACEMENT (IOC) RIGHT eyhance toric 2.46 00:25.3 (Right)  Patient location during evaluation: PACU Anesthesia Type: MAC Level of consciousness: awake and alert Pain management: pain level controlled Vital Signs Assessment: post-procedure vital signs reviewed and stable Respiratory status: spontaneous breathing, nonlabored ventilation, respiratory function stable and patient connected to nasal cannula oxygen Cardiovascular status: stable and blood pressure returned to baseline Postop Assessment: no apparent nausea or vomiting Anesthetic complications: no   No notable events documented.   Last Vitals:  Vitals:   03/02/23 1041 03/02/23 1139  BP: (!) 158/95 (!) 150/94  Pulse:  65  Resp: 18 14  Temp: 36.6 C (!) 36.2 C  SpO2: 95% 97%    Last Pain:  Vitals:   03/02/23 1139  TempSrc:   PainSc: 0-No pain                 [name]

## 2023-03-03 ENCOUNTER — Encounter: Payer: Self-pay | Admitting: Ophthalmology

## 2023-03-11 ENCOUNTER — Ambulatory Visit: Payer: Medicare Other | Admitting: Dermatology

## 2023-03-11 VITALS — BP 152/86

## 2023-03-11 DIAGNOSIS — Z1283 Encounter for screening for malignant neoplasm of skin: Secondary | ICD-10-CM | POA: Diagnosis not present

## 2023-03-11 DIAGNOSIS — L570 Actinic keratosis: Secondary | ICD-10-CM | POA: Diagnosis not present

## 2023-03-11 DIAGNOSIS — L729 Follicular cyst of the skin and subcutaneous tissue, unspecified: Secondary | ICD-10-CM

## 2023-03-11 DIAGNOSIS — L720 Epidermal cyst: Secondary | ICD-10-CM

## 2023-03-11 DIAGNOSIS — K130 Diseases of lips: Secondary | ICD-10-CM

## 2023-03-11 DIAGNOSIS — I8393 Asymptomatic varicose veins of bilateral lower extremities: Secondary | ICD-10-CM

## 2023-03-11 DIAGNOSIS — Z79899 Other long term (current) drug therapy: Secondary | ICD-10-CM

## 2023-03-11 DIAGNOSIS — L905 Scar conditions and fibrosis of skin: Secondary | ICD-10-CM

## 2023-03-11 DIAGNOSIS — L719 Rosacea, unspecified: Secondary | ICD-10-CM

## 2023-03-11 DIAGNOSIS — Z7189 Other specified counseling: Secondary | ICD-10-CM

## 2023-03-11 DIAGNOSIS — L821 Other seborrheic keratosis: Secondary | ICD-10-CM | POA: Diagnosis not present

## 2023-03-11 DIAGNOSIS — D229 Melanocytic nevi, unspecified: Secondary | ICD-10-CM

## 2023-03-11 DIAGNOSIS — D1801 Hemangioma of skin and subcutaneous tissue: Secondary | ICD-10-CM | POA: Diagnosis not present

## 2023-03-11 DIAGNOSIS — W908XXA Exposure to other nonionizing radiation, initial encounter: Secondary | ICD-10-CM

## 2023-03-11 DIAGNOSIS — I781 Nevus, non-neoplastic: Secondary | ICD-10-CM | POA: Diagnosis not present

## 2023-03-11 DIAGNOSIS — Z85828 Personal history of other malignant neoplasm of skin: Secondary | ICD-10-CM

## 2023-03-11 DIAGNOSIS — L578 Other skin changes due to chronic exposure to nonionizing radiation: Secondary | ICD-10-CM | POA: Diagnosis not present

## 2023-03-11 DIAGNOSIS — Z872 Personal history of diseases of the skin and subcutaneous tissue: Secondary | ICD-10-CM

## 2023-03-11 DIAGNOSIS — L814 Other melanin hyperpigmentation: Secondary | ICD-10-CM

## 2023-03-11 DIAGNOSIS — X32XXXA Exposure to sunlight, initial encounter: Secondary | ICD-10-CM

## 2023-03-11 DIAGNOSIS — D492 Neoplasm of unspecified behavior of bone, soft tissue, and skin: Secondary | ICD-10-CM

## 2023-03-11 HISTORY — DX: Actinic keratosis: L57.0

## 2023-03-11 NOTE — Progress Notes (Signed)
Follow-Up Visit   Subjective  Mary Galvan is a 72 y.o. female who presents for the following: Skin Cancer Screening and Full Body Skin Exam, hx of BCC below L brow, hx of Aks, check spot forehead, txted with BBL and never cleared/healed, red rashy area, L corner of mouth x 1 month, tried HC 2.5% cr, check spot L forehead, red blotchy area, check spot L lower leg 63m, crusty and then flakes off, Rosacea face, SM Triple cream qhs  The patient presents for Total-Body Skin Exam (TBSE) for skin cancer screening and mole check. The patient has spots, moles and lesions to be evaluated, some may be new or changing and the patient has concerns that these could be cancer.    The following portions of the chart were reviewed this encounter and updated as appropriate: medications, allergies, medical history  Review of Systems:  No other skin or systemic complaints except as noted in HPI or Assessment and Plan.  Objective  Well appearing patient in no apparent distress; mood and affect are within normal limits.  A full examination was performed including scalp, head, eyes, ears, nose, lips, neck, chest, axillae, abdomen, back, buttocks, bilateral upper extremities, bilateral lower extremities, hands, feet, fingers, toes, fingernails, and toenails. All findings within normal limits unless otherwise noted below.   Relevant physical exam findings are noted in the Assessment and Plan.  L proximal lat pretibial Red patch 1.1CM  chest x 8 (8) Pink scaly macules    Assessment & Plan   LENTIGINES, SEBORRHEIC KERATOSES, HEMANGIOMAS - Benign normal skin lesions - Benign-appearing - Call for any changes  MELANOCYTIC NEVI - Tan-brown and/or pink-flesh-colored symmetric macules and papules - Benign appearing on exam today - Observation - Call clinic for new or changing moles - Recommend daily use of broad spectrum spf 30+ sunscreen to sun-exposed areas.   ACTINIC DAMAGE - Chronic condition,  secondary to cumulative UV/sun exposure - diffuse scaly erythematous macules with underlying dyspigmentation - Recommend daily broad spectrum sunscreen SPF 30+ to sun-exposed areas, reapply every 2 hours as needed.  - Staying in the shade or wearing long sleeves, sun glasses (UVA+UVB protection) and wide brim hats (4-inch brim around the entire circumference of the hat) are also recommended for sun protection.  - Call for new or changing lesions.  SKIN CANCER SCREENING PERFORMED TODAY.  HISTORY OF BASAL CELL CARCINOMA OF THE SKIN - No evidence of recurrence today - Recommend regular full body skin exams - Recommend daily broad spectrum sunscreen SPF 30+ to sun-exposed areas, reapply every 2 hours as needed.  - Call if any new or changing lesions are noted between office visits  - Below L brow  ROSACEA Exam Erythema and telangiectasias face, dilated vessel forehead  Chronic condition with duration or expected duration over one year. Currently well-controlled.   Rosacea is a chronic progressive skin condition usually affecting the face of adults, causing redness and/or acne bumps. It is treatable but not curable. It sometimes affects the eyes (ocular rosacea) as well. It may respond to topical and/or systemic medication and can flare with stress, sun exposure, alcohol, exercise, topical steroids (including hydrocortisone/cortisone 10) and some foods.  Daily application of broad spectrum spf 30+ sunscreen to face is recommended to reduce flares.    Treatment Plan Recommend another BBL  Cont SM Triple cream qhs  Counseling for BBL / IPL / Laser and Coordination of Care Discussed the treatment option of Broad Band Light (BBL) [name] Pulsed Light (IPL)/ Laser  for skin discoloration, including brown spots and redness.  Typically we recommend at least 1-3 treatment sessions about 5-8 weeks apart for best results.  Cannot have tanned skin when BBL performed, and regular use of sunscreen is  advised after the procedure to help maintain results. The patient's condition may also require "maintenance treatments" in the future.  The fee for BBL / laser treatments is $350 per treatment session for the whole face.  A fee can be quoted for other parts of the body.  Insurance typically does not pay for BBL/laser treatments and therefore the fee is an out-of-pocket cost.   Neoplasm of skin L proximal lat pretibial  Epidermal / dermal shaving  Lesion diameter (cm):  1.1 Informed consent: discussed and consent obtained   Timeout: patient name, date of birth, surgical site, and procedure verified   Procedure prep:  Patient was prepped and draped in usual sterile fashion Prep type:  Isopropyl alcohol Anesthesia: the lesion was anesthetized in a standard fashion   Anesthetic:  1% lidocaine w/ epinephrine 1-100,000 buffered w/ 8.4% NaHCO3 Instrument used: flexible razor blade   Hemostasis achieved with: pressure, aluminum chloride and electrodesiccation   Outcome: patient tolerated procedure well   Post-procedure details: sterile dressing applied and wound care instructions given   Dressing type: bandage and petrolatum    Destruction of lesion Complexity: extensive   Destruction method: electrodesiccation and curettage   Informed consent: discussed and consent obtained   Timeout:  patient name, date of birth, surgical site, and procedure verified Procedure prep:  Patient was prepped and draped in usual sterile fashion Prep type:  Isopropyl alcohol Anesthesia: the lesion was anesthetized in a standard fashion   Anesthetic:  1% lidocaine w/ epinephrine 1-100,000 buffered w/ 8.4% NaHCO3 Curettage performed in three different directions: Yes   Electrodesiccation performed over the curetted area: Yes   Lesion length (cm):  1.1 Lesion width (cm):  1.1 Margin per side (cm):  0.2 Final wound size (cm):  1.5 Hemostasis achieved with:  pressure, aluminum chloride and  electrodesiccation Outcome: patient tolerated procedure well with no complications   Post-procedure details: sterile dressing applied and wound care instructions given   Dressing type: bandage and petrolatum    Specimen 1 - Surgical pathology Differential Diagnosis: D48.5 R/O BCC  Check Margins: yes Red patch 1.1cm EDC  AK (actinic keratosis) (8) chest x 8  Destruction of lesion - chest x 8 Complexity: simple   Destruction method: cryotherapy   Informed consent: discussed and consent obtained   Timeout:  patient name, date of birth, surgical site, and procedure verified Lesion destroyed using liquid nitrogen: Yes   Region frozen until ice ball extended beyond lesion: Yes   Outcome: patient tolerated procedure well with no complications   Post-procedure details: wound care instructions given     EPIDERMAL INCLUSION CYST Exam: Subcutaneous nodule at L axilla  Benign-appearing. Exam most consistent with an epidermal inclusion cyst. Discussed that a cyst is a benign growth that can grow over time and sometimes get irritated or inflamed. Recommend observation if it is not bothersome. Discussed option of surgical excision to remove it if it is growing, symptomatic, or other changes noted. Please call for new or changing lesions so they can be evaluated.   Varicose Veins/Spider Veins - Dilated blue, purple or red veins at the lower extremities - Reassured - Smaller vessels can be treated by sclerotherapy (a procedure to inject a medicine into the veins to make them disappear) if desired, but the  treatment is not covered by insurance. Larger vessels may be covered if symptomatic and we would refer to vascular surgeon if treatment desired.   SCAR with TELANGIECTASIS Exam: scar with telangiectasis L forehead  Treatment Plan: Recommend BBL   ANGULAR CHEILITIS Exam Scaly erythematous patches at L corner of mouth  Treatment Plan Discussed Filler, $650 for 1 syringe Cont HC 2.5% cr  qd prn  Discussed adding in Ketoconazole cream, pt will stay with the [facility] cream for now   Topical steroids (such as triamcinolone, fluocinolone, fluocinonide, mometasone, clobetasol, halobetasol, betamethasone, hydrocortisone) can cause thinning and lightening of the skin if they are used for too long in the same area. Your physician has selected the right strength medicine for your problem and area affected on the body. Please use your medication only as directed by your physician to prevent side effects.    Return in about 1 year (around 03/10/2024) for TBSE, Hx of BCC, Hx of AKs.  I, Ardis Rowan, RMA, am acting as scribe for Armida Sans, MD .   Documentation: I have reviewed the above documentation for accuracy and completeness, and I agree with the above.  Armida Sans, MD

## 2023-03-11 NOTE — Patient Instructions (Addendum)
Wound Care Instructions  Cleanse wound gently with soap and water once a day then pat dry with clean gauze. Apply a thin coat of Petrolatum (petroleum jelly, "Vaseline") over the wound (unless you have an allergy to this). We recommend that you use a new, sterile tube of Vaseline. Do not pick or remove scabs. Do not remove the yellow or white "healing tissue" from the base of the wound.  Cover the wound with fresh, clean, nonstick gauze and secure with paper tape. You may use Band-Aids in place of gauze and tape if the wound is small enough, but would recommend trimming much of the tape off as there is often too much. Sometimes Band-Aids can irritate the skin.  You should call the office for your biopsy report after 1 week if you have not already been contacted.  If you experience any problems, such as abnormal amounts of bleeding, swelling, significant bruising, significant pain, or evidence of infection, please call the office immediately.  FOR ADULT SURGERY PATIENTS: If you need something for pain relief you may take 1 extra strength Tylenol (acetaminophen) AND 2 Ibuprofen (200mg each) together every 4 hours as needed for pain. (do not take these if you are allergic to them or if you have a reason you should not take them.) Typically, you may only need pain medication for 1 to 3 days.     Due to recent changes in healthcare laws, you may see results of your pathology and/or laboratory studies on MyChart before the doctors have had a chance to review them. We understand that in some cases there may be results that are confusing or concerning to you. Please understand that not all results are received at the same time and often the doctors may need to interpret multiple results in order to provide you with the best plan of care or course of treatment. Therefore, we ask that you please give us 2 business days to thoroughly review all your results before contacting the office for clarification. Should  we see a critical lab result, you will be contacted sooner.   If You Need Anything After Your Visit  If you have any questions or concerns for your doctor, please call our main line at [phone] and press option 4 to reach your doctor's medical assistant. If no one answers, please leave a voicemail as directed and we will return your call as soon as possible. Messages left after 4 pm will be answered the following business day.   You may also send us a message via MyChart. We typically respond to MyChart messages within 1-2 business days.  For prescription refills, please ask your pharmacy to contact our office. Our fax number is [phone].  If you have an urgent issue when the clinic is closed that cannot wait until the next business day, you can page your doctor at the number below.    Please note that while we do our best to be available for urgent issues outside of office hours, we are not available 24/7.   If you have an urgent issue and are unable to reach us, you may choose to seek medical care at your doctor's office, retail clinic, urgent care center, or emergency room.  If you have a medical emergency, please immediately call 911 or go to the emergency department.  Pager Numbers  - Dr. Kowalski: [phone]  - Dr. Moye: [phone]  - Dr. Stewart: [phone]  In the event of inclement weather, please call our main line at   [phone] for an update on the status of any delays or closures.  Dermatology Medication Tips: Please keep the boxes that topical medications come in in order to help keep track of the instructions about where and how to use these. Pharmacies typically print the medication instructions only on the boxes and not directly on the medication tubes.   If your medication is too expensive, please contact our office at [phone] option 4 or send us a message through MyChart.   We are unable to tell what your co-pay for medications will be in  advance as this is different depending on your insurance coverage. However, we may be able to find a substitute medication at lower cost or fill out paperwork to get insurance to cover a needed medication.   If a prior authorization is required to get your medication covered by your insurance company, please allow us 1-2 business days to complete this process.  Drug prices often vary depending on where the prescription is filled and some pharmacies may offer cheaper prices.  The website www.goodrx.com contains coupons for medications through different pharmacies. The prices here do not account for what the cost may be with help from insurance (it may be cheaper with your insurance), but the website can give you the price if you did not use any insurance.  - You can print the associated coupon and take it with your prescription to the pharmacy.  - You may also stop by our office during regular business hours and pick up a GoodRx coupon card.  - If you need your prescription sent electronically to a different pharmacy, notify our office through [facility] MyChart or by phone at [phone] option 4.     Si Usted Necesita Algo Despus de Su Visita  Tambin puede enviarnos un mensaje a travs de MyChart. Por lo general respondemos a los mensajes de MyChart en el transcurso de 1 a 2 das hbiles.  Para renovar recetas, por favor pida a su farmacia que se ponga en contacto con nuestra oficina. Nuestro nmero de fax es el [phone].  Si tiene un asunto urgente cuando la clnica est cerrada y que no puede esperar hasta el siguiente da hbil, puede llamar/localizar a su doctor(a) al nmero que aparece a continuacin.   Por favor, tenga en cuenta que aunque hacemos todo lo posible para estar disponibles para asuntos urgentes fuera del horario de oficina, no estamos disponibles las 24 horas del da, los 7 das de la semana.   Si tiene un problema urgente y no puede comunicarse con nosotros, puede  optar por buscar atencin mdica  en el consultorio de su doctor(a), en una clnica privada, en un centro de atencin urgente o en una sala de emergencias.  Si tiene una emergencia mdica, por favor llame inmediatamente al 911 o vaya a la sala de emergencias.  Nmeros de bper  - Dr. Kowalski: [phone]  - Dra. Moye: [phone]  - Dra. Stewart: [phone]  En caso de inclemencias del tiempo, por favor llame a nuestra lnea principal al [phone] para una actualizacin sobre el estado de cualquier retraso o cierre.  Consejos para la medicacin en dermatologa: Por favor, guarde las cajas en las que vienen los medicamentos de uso tpico para ayudarle a seguir las instrucciones sobre dnde y cmo usarlos. Las farmacias generalmente imprimen las instrucciones del medicamento slo en las cajas y no directamente en los tubos del medicamento.   Si su medicamento es muy caro, por favor, pngase en contacto con   nuestra oficina llamando al [phone] y presione la opcin 4 o envenos un mensaje a travs de MyChart.   No podemos decirle cul ser su copago por los medicamentos por adelantado ya que esto es diferente dependiendo de la cobertura de su seguro. Sin embargo, es posible que podamos encontrar un medicamento sustituto a menor costo o llenar un formulario para que el seguro cubra el medicamento que se considera necesario.   Si se requiere una autorizacin previa para que su compaa de seguros cubra su medicamento, por favor permtanos de 1 a 2 das hbiles para completar este proceso.  Los precios de los medicamentos varan con frecuencia dependiendo del lugar de dnde se surte la receta y alguna farmacias pueden ofrecer precios ms baratos.  El sitio web www.goodrx.com tiene cupones para medicamentos de diferentes farmacias. Los precios aqu no tienen en cuenta lo que podra costar con la ayuda del seguro (puede ser ms barato con su seguro), pero el sitio web puede darle el  precio si no utiliz ningn seguro.  - Puede imprimir el cupn correspondiente y llevarlo con su receta a la farmacia.  - Tambin puede pasar por nuestra oficina durante el horario de atencin regular y recoger una tarjeta de cupones de GoodRx.  - Si necesita que su receta se enve electrnicamente a una farmacia diferente, informe a nuestra oficina a travs de MyChart de [place] o por telfono llamando al [phone] y presione la opcin 4.  

## 2023-03-18 ENCOUNTER — Telehealth: Payer: Self-pay

## 2023-03-18 ENCOUNTER — Encounter: Payer: Self-pay | Admitting: Dermatology

## 2023-03-18 NOTE — Telephone Encounter (Signed)
Advised pt of bx results/sh ?

## 2023-03-18 NOTE — Telephone Encounter (Signed)
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

## 2023-04-01 DIAGNOSIS — Z961 Presence of intraocular lens: Secondary | ICD-10-CM | POA: Diagnosis not present

## 2023-04-08 DIAGNOSIS — Z961 Presence of intraocular lens: Secondary | ICD-10-CM | POA: Diagnosis not present

## 2023-08-06 ENCOUNTER — Ambulatory Visit (INDEPENDENT_AMBULATORY_CARE_PROVIDER_SITE_OTHER): Payer: Medicare Other

## 2023-08-06 DIAGNOSIS — Z23 Encounter for immunization: Secondary | ICD-10-CM | POA: Diagnosis not present

## 2023-08-20 ENCOUNTER — Ambulatory Visit (INDEPENDENT_AMBULATORY_CARE_PROVIDER_SITE_OTHER): Payer: Self-pay | Admitting: Dermatology

## 2023-08-20 DIAGNOSIS — I781 Nevus, non-neoplastic: Secondary | ICD-10-CM

## 2023-08-20 DIAGNOSIS — L814 Other melanin hyperpigmentation: Secondary | ICD-10-CM

## 2023-08-20 DIAGNOSIS — L719 Rosacea, unspecified: Secondary | ICD-10-CM

## 2023-08-20 NOTE — Patient Instructions (Signed)

## 2023-08-20 NOTE — Progress Notes (Signed)
Follow-Up Visit   Subjective  Mary Galvan is a 72 y.o. female who presents for the following: Rosacea and lentigines of the face (Patient presents for BBL today, 4th treatment)   The following portions of the chart were reviewed this encounter and updated as appropriate: medications, allergies, medical history  Review of Systems:  No other skin or systemic complaints except as noted in HPI or Assessment and Plan.  Objective  Well appearing patient in no apparent distress; mood and affect are within normal limits.  Areas Examined: face  Relevant physical exam findings are noted in the Assessment and Plan.    Assessment & Plan   Rosacea cheeks, chin, nose  BBL today.  Rosacea is a chronic progressive skin condition usually affecting the face of adults, causing redness and/or acne bumps. It is treatable but not curable. It sometimes affects the eyes (ocular rosacea) as well. It may respond to topical and/or systemic medication and can flare with stress, sun exposure, alcohol, exercise, topical steroids (including hydrocortisone/cortisone 10) and some foods.  Daily application of broad spectrum spf 30+ sunscreen to face is recommended to reduce flares.  Photorejuvenation - cheeks, chin, nose Prior to the procedure, the patient's past medical history, medications, allergies, and the rare but potential risks and complications were reviewed with the patient and a signed consent was obtained.  Pre and post treatment care was discussed and instructions provided.  1. Rosacea cheeks, chin, nose Mid face erythema with telangiectasias                  BBL today.  Rosacea is a chronic progressive skin condition usually affecting the face of adults, causing redness and/or acne bumps. It is treatable but not curable. It sometimes affects the eyes (ocular rosacea) as well. It may respond to topical and/or systemic medication and can flare with stress, sun exposure,  alcohol, exercise, topical steroids (including hydrocortisone/cortisone 10) and some foods.  Daily application of broad spectrum spf 30+ sunscreen to face is recommended to reduce flares.  Photorejuvenation - cheeks, chin, nose Prior to the procedure, the patient's past medical history, medications, allergies, and the rare but potential risks and complications were reviewed with the patient and a signed consent was obtained.  Pre and post treatment care was discussed and instructions provided.  [place] BBL - 08/20/23     Patient Details   Skin Type: II    Anesthestic Cream Applied: No    Photo Takes: No    Consent Signed: Yes    Improvement from Previous Treatment: Yes      Treatment Details   Date: 08/20/23    Treatment #: 4   Area: face    Filter: 1st Pass    1st Pass   Location: Face  Device: 560 Filter   BBL j/cm2: 27    PW Msec Sec: 26    Cooling Temp: 20    Pulses 96  7mm : This crystal used     Patient tolerated the procedure well.   Wynelle Link avoidance was stressed. The patient will call with any problems, questions or concerns prior to their next appointment.        2. Lentigo face Scattered tan macules.        BBL today.  Photorejuvenation - face Prior to the procedure, the patient's past medical history, medications, allergies, and the rare but potential risks and complications were reviewed with the patient and a signed consent was obtained.  Pre and post treatment care  was discussed and instructions provided.  [facility] BBL - 08/20/2023    Patient Details   Skin Type: II    Anesthestic Cream Applied: No    Photo Takes: Yes    Consent Signed: Yes    Improvement from Previous Treatment: Yes      Treatment Details   Date: 08/20/23    Treatment #: 4    Area: face   Filter: 2nd Pass      2nd Pass   Location: face  Device: 515 Filter   BBL j/cm2: 15    PW Msec Sec: 15    Cooling Temp: 15     11mm : used this crystal    Patient tolerated the  procedure well.   Wynelle Link avoidance was stressed. The patient will call with any problems, questions or concerns prior to their next appointment.     Patient tolerated the procedure well.   Wynelle Link avoidance was stressed. The patient will call with any problems, questions or concerns prior to their next appointment.    Lentigo face  BBL today.  Photorejuvenation - face Prior to the procedure, the patient's past medical history, medications, allergies, and the rare but potential risks and complications were reviewed with the patient and a signed consent was obtained.  Pre and post treatment care was discussed and instructions provided.   Patient tolerated the procedure well.   Wynelle Link avoidance was stressed. The patient will call with any problems, questions or concerns prior to their next appointment.    Return if symptoms worsen or fail to improve.  Wendee Beavers, CMA, am acting as scribe for Armida Sans, MD .   Documentation: I have reviewed the above documentation for accuracy and completeness, and I agree with the above.  Armida Sans, MD

## 2023-08-31 ENCOUNTER — Encounter: Payer: Self-pay | Admitting: Dermatology

## 2023-10-01 DIAGNOSIS — H26493 Other secondary cataract, bilateral: Secondary | ICD-10-CM | POA: Diagnosis not present

## 2023-10-01 DIAGNOSIS — H353132 Nonexudative age-related macular degeneration, bilateral, intermediate dry stage: Secondary | ICD-10-CM | POA: Diagnosis not present

## 2023-10-01 DIAGNOSIS — Z961 Presence of intraocular lens: Secondary | ICD-10-CM | POA: Diagnosis not present

## 2023-10-01 DIAGNOSIS — H43813 Vitreous degeneration, bilateral: Secondary | ICD-10-CM | POA: Diagnosis not present

## 2023-10-28 ENCOUNTER — Ambulatory Visit (INDEPENDENT_AMBULATORY_CARE_PROVIDER_SITE_OTHER): Payer: Medicare Other | Admitting: Family Medicine

## 2023-10-28 ENCOUNTER — Encounter: Payer: Self-pay | Admitting: Family Medicine

## 2023-10-28 VITALS — BP 180/90 | HR 74 | Temp 98.4°F | Wt 189.8 lb

## 2023-10-28 DIAGNOSIS — I10 Essential (primary) hypertension: Secondary | ICD-10-CM | POA: Diagnosis not present

## 2023-10-28 DIAGNOSIS — Z1231 Encounter for screening mammogram for malignant neoplasm of breast: Secondary | ICD-10-CM

## 2023-10-28 MED ORDER — CHLORTHALIDONE 25 MG PO TABS
25.0000 mg | ORAL_TABLET | Freq: Every day | ORAL | 0 refills | Status: DC
Start: 2023-10-28 — End: 2023-12-22

## 2023-10-28 NOTE — Progress Notes (Signed)
 BP (!) 180/90   Pulse 74   Temp 98.4 F (36.9 C) (Oral)   Wt 189 lb 12.8 oz (86.1 kg)   SpO2 99%   BMI 33.09 kg/m    Subjective:    Patient ID: Mary Galvan, female    DOB: [birth year], 73 y.o.   MRN: [id number]  HPI: Mary Galvan is a 73 y.o. female  Chief Complaint  Patient presents with   Leg Swelling    Patient says over the past couple of months, she has noticed ankle and feet swelling that radiates up into the knee area. Patient says she has been on HCTZ before in the past due to her signs being better at the time. Patient says she has noticed some weight gain along with swelling. Patient says she would discuss options with provider at today's visit.    Muscle Pain   Ankles have been swelling for a few weeks since it started getting cold and she wasn't as active working in the yard. She notes that it's not resolving overnight. She notes that she has been using compression hose and that has helped a lot.   HYPERTENSION  Hypertension status: uncontrolled  Satisfied with current treatment? Not on anything Duration of hypertension: chronic BP monitoring frequency:  rarely BP medication side effects:  not on anything Previous BP meds: HCTZ Aspirin: no Recurrent headaches: no Visual changes: no Palpitations: no Dyspnea: no Chest pain: no Lower extremity edema: yes Dizzy/lightheaded: no  Relevant past medical, surgical, family and social history reviewed and updated as indicated. Interim medical history since our last visit reviewed. Allergies and medications reviewed and updated.  Review of Systems  Constitutional: Negative.   Respiratory: Negative.    Cardiovascular:  Positive for leg swelling. Negative for chest pain and palpitations.  Musculoskeletal: Negative.   Neurological: Negative.   Psychiatric/Behavioral: Negative.      Per HPI unless specifically indicated above     Objective:    BP (!) 180/90   Pulse 74   Temp 98.4 F (36.9 C) (Oral)    Wt 189 lb 12.8 oz (86.1 kg)   SpO2 99%   BMI 33.09 kg/m   Wt Readings from Last 3 Encounters:  10/28/23 189 lb 12.8 oz (86.1 kg)  03/02/23 180 lb 3.2 oz (81.7 kg)  02/16/23 182 lb 6.4 oz (82.7 kg)    Physical Exam Vitals and nursing note reviewed.  Constitutional:      General: She is not in acute distress.    Appearance: Normal appearance. She is not ill-appearing, toxic-appearing or diaphoretic.  HENT:     Head: Normocephalic and atraumatic.     Right Ear: External ear normal.     Left Ear: External ear normal.     Nose: Nose normal.     Mouth/Throat:     Mouth: Mucous membranes are moist.     Pharynx: Oropharynx is clear.  Eyes:     General: No scleral icterus.       Right eye: No discharge.        Left eye: No discharge.     Extraocular Movements: Extraocular movements intact.     Conjunctiva/sclera: Conjunctivae normal.     Pupils: Pupils are equal, round, and reactive to light.  Cardiovascular:     Rate and Rhythm: Normal rate and regular rhythm.     Pulses: Normal pulses.     Heart sounds: Normal heart sounds. No murmur heard.    No friction rub. No gallop.  Pulmonary:     Effort: Pulmonary effort is normal. No respiratory distress.     Breath sounds: Normal breath sounds. No stridor. No wheezing, rhonchi or rales.  Chest:     Chest wall: No tenderness.  Musculoskeletal:        General: Normal range of motion.     Cervical back: Normal range of motion and neck supple.     Right lower leg: Edema present.     Left lower leg: Edema present.  Skin:    General: Skin is warm and dry.     Capillary Refill: Capillary refill takes less than 2 seconds.     Coloration: Skin is not jaundiced or pale.     Findings: No bruising, erythema, lesion or rash.  Neurological:     General: No focal deficit present.     Mental Status: She is alert and oriented to person, place, and time. Mental status is at baseline.  Psychiatric:        Mood and Affect: Mood normal.         Behavior: Behavior normal.        Thought Content: Thought content normal.        Judgment: Judgment normal.     Results for orders placed or performed in visit on 12/16/22  Urinalysis, Routine w reflex microscopic   Collection Time: 12/16/22 10:19 AM  Result Value Ref Range   Specific Gravity, UA 1.010 1.005 - 1.030   pH, UA 5.5 5.0 - 7.5   Color, UA Yellow Yellow   Appearance Ur Clear Clear   Leukocytes,UA Negative Negative   Protein,UA Negative Negative/Trace   Glucose, UA Negative Negative   Ketones, UA Negative Negative   RBC, UA Negative Negative   Bilirubin, UA Negative Negative   Urobilinogen, Ur 0.2 0.2 - 1.0 mg/dL   Nitrite, UA Negative Negative   Microscopic Examination Comment   Microalbumin, Urine Waived   Collection Time: 12/16/22 10:19 AM  Result Value Ref Range   Microalb, Ur Waived 10 0 - 19 mg/L   Creatinine, Urine Waived 10 10 - 300 mg/dL   Microalb/Creat Ratio <30 <30 mg/g  Bayer DCA Hb A1c Waived   Collection Time: 12/16/22 10:19 AM  Result Value Ref Range   HB A1C (BAYER DCA - WAIVED) 5.9 (H) 4.8 - 5.6 %  CBC with Differential/Platelet   Collection Time: 12/16/22 10:21 AM  Result Value Ref Range   WBC 6.4 3.4 - 10.8 x10E3/uL   RBC 5.06 3.77 - 5.28 x10E6/uL   Hemoglobin 15.6 11.1 - 15.9 g/dL   Hematocrit 16.1 (H) 09.6 - 46.6 %   MCV 93 79 - 97 fL   MCH 30.8 26.6 - 33.0 pg   MCHC 33.2 31.5 - 35.7 g/dL   RDW 04.5 40.9 - 81.1 %   Platelets 229 150 - 450 x10E3/uL   Neutrophils 52 Not Estab. %   Lymphs 38 Not Estab. %   Monocytes 8 Not Estab. %   Eos 1 Not Estab. %   Basos 1 Not Estab. %   Neutrophils Absolute 3.4 1.4 - 7.0 x10E3/uL   Lymphocytes Absolute 2.4 0.7 - 3.1 x10E3/uL   Monocytes Absolute 0.5 0.1 - 0.9 x10E3/uL   EOS (ABSOLUTE) 0.1 0.0 - 0.4 x10E3/uL   Basophils Absolute 0.0 0.0 - 0.2 x10E3/uL   Immature Granulocytes 0 Not Estab. %   Immature Grans (Abs) 0.0 0.0 - 0.1 x10E3/uL  Comprehensive metabolic panel   Collection Time:  12/16/22 10:21 AM  Result Value Ref Range   Glucose 95 70 - 99 mg/dL   BUN 17 8 - 27 mg/dL   Creatinine, Ser 4.74 0.57 - 1.00 mg/dL   eGFR 94 >25 ZD/GLO/7.56   BUN/Creatinine Ratio 27 12 - 28   Sodium 142 134 - 144 mmol/L   Potassium 4.2 3.5 - 5.2 mmol/L   Chloride 100 96 - 106 mmol/L   CO2 24 20 - 29 mmol/L   Calcium 9.9 8.7 - 10.3 mg/dL   Total Protein 6.7 6.0 - 8.5 g/dL   Albumin 4.3 3.8 - 4.8 g/dL   Globulin, Total 2.4 1.5 - 4.5 g/dL   Albumin/Globulin Ratio 1.8 1.2 - 2.2   Bilirubin Total 0.3 0.0 - 1.2 mg/dL   Alkaline Phosphatase 88 44 - 121 IU/L   AST 19 0 - 40 IU/L   ALT 19 0 - 32 IU/L  Lipid Panel w/o Chol/HDL Ratio   Collection Time: 12/16/22 10:21 AM  Result Value Ref Range   Cholesterol, Total 268 (H) 100 - 199 mg/dL   Triglycerides 433 (H) 0 - 149 mg/dL   HDL 60 >29 mg/dL   VLDL Cholesterol Cal 28 5 - 40 mg/dL   LDL Chol Calc (NIH) 518 (H) 0 - 99 mg/dL  TSH   Collection Time: 12/16/22 10:21 AM  Result Value Ref Range   TSH 1.550 0.450 - 4.500 uIU/mL  VITAMIN D  25 Hydroxy (Vit-D Deficiency, Fractures)   Collection Time: 12/16/22 10:21 AM  Result Value Ref Range   Vit D, 25-Hydroxy 33.4 30.0 - 100.0 ng/mL      Assessment & Plan:   Problem List Items Addressed This Visit       Cardiovascular and Mediastinum   HTN (hypertension) - Primary   Will start her on chlorthalidone  and recheck in 6 weeks at her physical. Call with any concerns. Continue compression socks.       Relevant Medications   chlorthalidone  (HYGROTON ) 25 MG tablet   Other Visit Diagnoses       Encounter for screening mammogram for malignant neoplasm of breast       Relevant Orders   MM 3D SCREENING MAMMOGRAM BILATERAL BREAST        Follow up plan: Return for As scheduled.

## 2023-10-28 NOTE — Patient Instructions (Signed)
 Please call to schedule your mammogram and/or bone density: [facility]  Address: [address], [place], [place] [address] Phone: [phone]  [facility] [address] [place],  [place]  [address] Phone: [phone]

## 2023-10-28 NOTE — Assessment & Plan Note (Signed)
 Will start her on chlorthalidone  and recheck in 6 weeks at her physical. Call with any concerns. Continue compression socks.

## 2023-11-11 ENCOUNTER — Ambulatory Visit: Payer: Medicare Other | Admitting: Dermatology

## 2023-11-17 ENCOUNTER — Ambulatory Visit: Payer: Medicare Other | Admitting: Dermatology

## 2023-11-17 ENCOUNTER — Encounter: Payer: Self-pay | Admitting: Dermatology

## 2023-11-17 DIAGNOSIS — D492 Neoplasm of unspecified behavior of bone, soft tissue, and skin: Secondary | ICD-10-CM

## 2023-11-17 DIAGNOSIS — D2339 Other benign neoplasm of skin of other parts of face: Secondary | ICD-10-CM

## 2023-11-17 NOTE — Patient Instructions (Signed)

## 2023-11-17 NOTE — Progress Notes (Signed)
   Follow-Up Visit   Subjective  Mary Galvan is a 73 y.o. female who presents for the following: cyst on left jaw line. Has gotten larger. Non tender. States Dr. Hester advised her that it can be removed and will be covered by insurance.   The patient has spots, moles and lesions to be evaluated, some may be new or changing and the patient may have concern these could be cancer.   The following portions of the chart were reviewed this encounter and updated as appropriate: medications, allergies, medical history  Review of Systems:  No other skin or systemic complaints except as noted in HPI or Assessment and Plan.  Objective  Well appearing patient in no apparent distress; mood and affect are within normal limits.  A focused examination was performed of the following areas: Face   Relevant physical exam findings are noted in the Assessment and Plan.  Left Posterior Mandible 6 mm violaceous vascular papule        Assessment & Plan   NEOPLASM OF SKIN Left Posterior Mandible Epidermal / dermal shaving  Lesion diameter (cm):  0.6 Informed consent: discussed and consent obtained   Timeout: patient name, date of birth, surgical site, and procedure verified   Procedure prep:  Patient was prepped and draped in usual sterile fashion Prep type:  Povidone-iodine and isopropyl alcohol Anesthesia: the lesion was anesthetized in a standard fashion   Anesthetic:  1% lidocaine  w/ epinephrine  1-100,000 buffered w/ 8.4% NaHCO3 Instrument used: DermaBlade   Hemostasis achieved with: pressure, aluminum chloride and electrodesiccation   Outcome: patient tolerated procedure well   Post-procedure details: wound care instructions given   Specimen 1 - Surgical pathology Differential Diagnosis: hidrocystoma vs venous lake vs angioma vs cyst. TWO PIECES in bottle  Check Margins: No   Return for TBSE As Scheduled, With Dr. Hester.  I, Jill Parcell, CMA, am acting as scribe for  Boneta Sharps, MD.   Documentation: I have reviewed the above documentation for accuracy and completeness, and I agree with the above.  Boneta Sharps, MD

## 2023-11-23 ENCOUNTER — Encounter: Payer: Self-pay | Admitting: Dermatology

## 2023-11-23 LAB — SURGICAL PATHOLOGY

## 2023-12-14 ENCOUNTER — Ambulatory Visit
Admission: RE | Admit: 2023-12-14 | Discharge: 2023-12-14 | Disposition: A | Discharge status: Home / Self Care | Payer: Medicare Other | Source: Ambulatory Visit | Attending: Family Medicine | Admitting: Family Medicine

## 2023-12-14 DIAGNOSIS — Z1231 Encounter for screening mammogram for malignant neoplasm of breast: Secondary | ICD-10-CM | POA: Insufficient documentation

## 2023-12-17 ENCOUNTER — Encounter: Payer: Self-pay | Admitting: Family Medicine

## 2023-12-22 ENCOUNTER — Ambulatory Visit (INDEPENDENT_AMBULATORY_CARE_PROVIDER_SITE_OTHER): Payer: Medicare Other | Admitting: Family Medicine

## 2023-12-22 ENCOUNTER — Encounter: Payer: Self-pay | Admitting: Family Medicine

## 2023-12-22 VITALS — BP 146/72 | HR 74 | Temp 98.4°F | Resp 15 | Ht 64.72 in | Wt 186.2 lb

## 2023-12-22 DIAGNOSIS — E782 Mixed hyperlipidemia: Secondary | ICD-10-CM

## 2023-12-22 DIAGNOSIS — Z0000 Encounter for general adult medical examination without abnormal findings: Secondary | ICD-10-CM

## 2023-12-22 DIAGNOSIS — E6609 Other obesity due to excess calories: Secondary | ICD-10-CM

## 2023-12-22 DIAGNOSIS — E559 Vitamin D deficiency, unspecified: Secondary | ICD-10-CM | POA: Diagnosis not present

## 2023-12-22 DIAGNOSIS — R7301 Impaired fasting glucose: Secondary | ICD-10-CM

## 2023-12-22 DIAGNOSIS — Z6831 Body mass index (BMI) 31.0-31.9, adult: Secondary | ICD-10-CM

## 2023-12-22 DIAGNOSIS — I700 Atherosclerosis of aorta: Secondary | ICD-10-CM

## 2023-12-22 DIAGNOSIS — E66811 Other obesity due to excess calories: Secondary | ICD-10-CM

## 2023-12-22 DIAGNOSIS — I10 Essential (primary) hypertension: Secondary | ICD-10-CM

## 2023-12-22 DIAGNOSIS — F3342 Major depressive disorder, recurrent, in full remission: Secondary | ICD-10-CM

## 2023-12-22 LAB — MICROALBUMIN, URINE WAIVED
Creatinine, Urine Waived: 100 mg/dL (ref 10–300)
Microalb, Ur Waived: 30 mg/L — ABNORMAL HIGH (ref 0–19)
Microalb/Creat Ratio: <30 mg/g (ref <30)

## 2023-12-22 MED ORDER — BUPROPION HCL ER (SR) 150 MG PO TB12: ORAL_TABLET | ORAL | 2 refills | Status: DC

## 2023-12-22 MED ORDER — CHLORTHALIDONE 25 MG PO TABS: 25.0000 mg | ORAL_TABLET | Freq: Every day | ORAL | 1 refills | Status: DC

## 2023-12-22 NOTE — Assessment & Plan Note (Signed)
 Rechecking labs today. Await results. Treat as needed.

## 2023-12-22 NOTE — Progress Notes (Signed)
 BP (!) 146/72   Pulse 74   Temp 98.4 F (36.9 C) (Oral)   Resp 15   Ht 5' 4.72" (1.644 m)   Wt 186 lb 3.2 oz (84.5 kg)   SpO2 98%   BMI 31.25 kg/m    Subjective:    Patient ID: Mary Galvan, female    DOB: Sep 16, 1951, 73 y.o.   MRN: [id number]  HPI: Mary Galvan is a 73 y.o. female presenting on 12/22/2023 for comprehensive medical examination. Current medical complaints include:  OBESITY Duration: chronic Previous attempts at weight loss: yes Complications of obesity: HTN, HLD, IFG Weight loss goal: 150s Weight loss to date: 4 lbs Requesting obesity pharmacotherapy: no Current weight loss supplements/medications: no Previous weight loss supplements/meds: no  HYPERTENSION / HYPERLIPIDEMIA Satisfied with current treatment? yes Duration of hypertension: chronic BP monitoring frequency: not checking BP medication side effects: no Past BP meds: chlorthalidone Duration of hyperlipidemia: chronic Cholesterol medication side effects: no Cholesterol supplements: none Past cholesterol medications: none Medication compliance: excellent compliance Aspirin: no Recent stressors: no Recurrent headaches: no Visual changes: no Palpitations: no Dyspnea: no Chest pain: no Lower extremity edema: yes Dizzy/lightheaded: no  Impaired Fasting Glucose HbA1C:  Lab Results  Component Value Date   HGBA1C 5.9 (H) 12/16/2022   Duration of elevated blood sugar: chronic Polydipsia: no Polyuria: no Weight change: no Visual disturbance: no Glucose Monitoring: no Diabetic Education: Not Completed Family history of diabetes: no  DEPRESSION Mood status: stable Satisfied with current treatment?: yes Symptom severity: mild  Duration of current treatment : chronic Side effects: no Medication compliance: N/A Psychotherapy/counseling: no  Previous psychiatric medications: none Depressed mood: no Anxious mood: yes Anhedonia: no Significant weight loss or gain:  no Insomnia: no  Fatigue: yes Feelings of worthlessness or guilt: no Impaired concentration/indecisiveness: no Suicidal ideations: no Hopelessness: no Crying spells: no    12/22/2023   10:46 AM 10/28/2023   11:26 AM 12/16/2022   10:21 AM 12/16/2022   10:12 AM 12/09/2022    3:01 PM  Depression screen PHQ 2/9  Decreased Interest 0 0 1 1 0  Down, Depressed, Hopeless 0 0 1 1 0  PHQ - 2 Score 0 0 2 2 0  Altered sleeping 0 0 0 0 0  Tired, decreased energy 1 1 1 1  0  Change in appetite 0 1 1 1  0  Feeling bad or failure about yourself  0 0 1 1 0  Trouble concentrating 0 0 0 0 0  Moving slowly or fidgety/restless 0 0 0 0 0  Suicidal thoughts 0 0 0 0 0  PHQ-9 Score 1 2 5 5  0  Difficult doing work/chores  Somewhat difficult  Not difficult at all Not difficult at all    She currently lives with: husband Menopausal Symptoms: no  Depression Screen done today and results listed below:     12/22/2023   10:46 AM 10/28/2023   11:26 AM 12/16/2022   10:21 AM 12/16/2022   10:12 AM 12/09/2022    3:01 PM  Depression screen PHQ 2/9  Decreased Interest 0 0 1 1 0  Down, Depressed, Hopeless 0 0 1 1 0  PHQ - 2 Score 0 0 2 2 0  Altered sleeping 0 0 0 0 0  Tired, decreased energy 1 1 1 1  0  Change in appetite 0 1 1 1  0  Feeling bad or failure about yourself  0 0 1 1 0  Trouble concentrating 0 0 0 0 0  Moving  slowly or fidgety/restless 0 0 0 0 0  Suicidal thoughts 0 0 0 0 0  PHQ-9 Score 1 2 5 5  0  Difficult doing work/chores  Somewhat difficult  Not difficult at all Not difficult at all    Past Medical History:  Past Medical History:  Diagnosis Date   Actinic keratosis 03/11/2023   L proximal lat pretibial, ED[facility]   Anxiety    Arthritis    thumbs   Basal cell carcinoma 10/20/2017   just under left brow   Cataract 2024   Lens implant surgery complete on both eyes   Depression    Dysplastic nevus 01/19/2018   right distal dorsum foot   Family history of adverse reaction to anesthesia    Fatty  liver disease, nonalcoholic    GERD (gastroesophageal reflux disease)    Heart murmur    Hyperlipidemia    Hypertension    Kidney cysts    Motion sickness    reading in cars   Ocular migraine    PONV (postoperative nausea and vomiting)    slow to wake   Rosacea    Seasonal affective disorder (HCC)    Seasonal allergies    Seizures (HCC)    x1 - after head trauma. 1970's   Vitreous detachment of left eye 04/2011   [facility]    Surgical History:  Past Surgical History:  Procedure Laterality Date   BASAL CELL CARCINOMA EXCISION Left    removed from above left eye    BROW LIFT Bilateral 05/02/2022   Procedure: BLEPHAROPLASTY UPPER EYELID; W/EXCESS SKIN BROW PTOSIS REPAIR BILATERAL;  Surgeon: Imagene Riches, MD;  Location: [facility];  Service: Ophthalmology;  Laterality: Bilateral;   CATARACT EXTRACTION W/PHACO Left 02/16/2023   Procedure: CATARACT EXTRACTION PHACO AND INTRAOCULAR LENS PLACEMENT (IOC) LEFT;  Surgeon: Nevada Crane, MD;  Location: [facility];  Service: Ophthalmology;  Laterality: Left;  4.01 0:23.2   CATARACT EXTRACTION W/PHACO Right 03/02/2023   Procedure: CATARACT EXTRACTION PHACO AND INTRAOCULAR LENS PLACEMENT (IOC) RIGHT eyhance toric 2.46 00:25.3;  Surgeon: Nevada Crane, MD;  Location: [facility];  Service: Ophthalmology;  Laterality: Right;   CHOLECYSTECTOMY  2015   COLONOSCOPY WITH PROPOFOL N/A 05/10/2015   Procedure: COLONOSCOPY WITH PROPOFOL;  Surgeon: Midge Minium, MD;  Location: [facility];  Service: Endoscopy;  Laterality: N/A;  WITH BIOPSY-- SIGMOID COLON POLYP  X  4 DESCENDING COLON POLYP   COLONOSCOPY WITH PROPOFOL N/A 11/05/2021   Procedure: COLONOSCOPY WITH PROPOFOL;  Surgeon: Toney Reil, MD;  Location: [facility] ENDOSCOPY;  Service: Gastroenterology;  Laterality: N/A;   COSMETIC SURGERY  2024   blepharoplasty to help with my visual field   DILATATION & CURETTAGE/HYSTEROSCOPY WITH MYOSURE  N/A 03/28/2016   Procedure: DILATATION & CURETTAGE/HYSTEROSCOPY WITH MYOSURE;  Surgeon: Suzy Bouchard, MD;  Location: [facility] ORS;  Service: Gynecology;  Laterality: N/A;   ESOPHAGOGASTRODUODENOSCOPY (EGD) WITH PROPOFOL N/A 09/02/2017   Procedure: ESOPHAGOGASTRODUODENOSCOPY (EGD) WITH PROPOFOL;  Surgeon: Toney Reil, MD;  Location: [facility];  Service: Endoscopy;  Laterality: N/A;   EYE SURGERY  2024   blepharoplasty and lens implants   MELANOMA EXCISION     pre melenoma excision on right foot    OTHER SURGICAL HISTORY     Uterine Polyp    Medications:  Current Outpatient Medications on File Prior to Visit  Medication Sig   Cholecalciferol (VITAMIN D3) 2000 UNITS TABS Take 2,000 Units by mouth daily.  Multiple Vitamins-Minerals (PRESERVISION AREDS 2 PO) Take 1 tablet by mouth 2 (two) times daily.   NONFORMULARY OR COMPOUNDED ITEM daily. Azelaic Acid/metronidazole/ivermectin  topical   No current facility-administered medications on file prior to visit.    Allergies:  No Known Allergies  Social History:  Social History   Socioeconomic History   Marital status: Married    Spouse name: Not on file   Number of children: Not on file   Years of education: Not on file   Highest education level: Associate degree: occupational, Scientist, product/process development, or vocational program  Occupational History   Occupation: retired  Tobacco Use   Smoking status: Former    Current packs/day: 0.00    Average packs/day: 1 pack/day for 30.0 years (30.0 ttl pk-yrs)    Types: Cigarettes    Start date: 03/13/1970    Quit date: 03/13/2000    Years since quitting: 23.7   Smokeless tobacco: Never   Tobacco comments:    I quit several times and started back until finally quitting for good in 2001.  Vaping Use   Vaping status: Never Used  Substance and Sexual Activity   Alcohol use: Yes    Alcohol/week: 3.0 standard drinks of alcohol    Types: 1 Glasses of wine, 1 Cans of beer, 1 Standard  drinks or equivalent per week    Comment: Not every week   Drug use: No   Sexual activity: Not Currently    Partners: Male  Other Topics Concern   Not on file  Social History Narrative   Not on file   Social Drivers of Health   Financial Resource Strain: Low Risk  (12/21/2023)   Overall Financial Resource Strain (CARDIA)    Difficulty of Paying Living Expenses: Not hard at all  Food Insecurity: No Food Insecurity (12/21/2023)   Hunger Vital Sign    Worried About Running Out of Food in the Last Year: Never true    Ran Out of Food in the Last Year: Never true  Transportation Needs: No Transportation Needs (12/21/2023)   PRAPARE - Administrator, Civil Service (Medical): No    Lack of Transportation (Non-Medical): No  Physical Activity: Insufficiently Active (12/21/2023)   Exercise Vital Sign    Days of Exercise per Week: 3 days    Minutes of Exercise per Session: 30 min  Stress: No Stress Concern Present (12/21/2023)   [employer] of Occupational Health - Occupational Stress Questionnaire    Feeling of Stress : Only a little  Social Connections: Moderately Isolated (12/21/2023)   Social Connection and Isolation Panel [NHANES]    Frequency of Communication with Friends and Family: More than three times a week    Frequency of Social Gatherings with Friends and Family: Twice a week    Attends Religious Services: Never    Database administrator or Organizations: No    Attends Engineer, structural: Not on file    Marital Status: Married  Catering manager Violence: Not At Risk (12/09/2022)   Humiliation, Afraid, Rape, and Kick questionnaire    Fear of Current or Ex-Partner: No    Emotionally Abused: No    Physically Abused: No    Sexually Abused: No   Social History   Tobacco Use  Smoking Status Former   Current packs/day: 0.00   Average packs/day: 1 pack/day for 30.0 years (30.0 ttl pk-yrs)   Types: Cigarettes   Start date: 03/13/1970   Quit date:  03/13/2000   Years since quitting:  23.7  Smokeless Tobacco Never  Tobacco Comments   I quit several times and started back until finally quitting for good in 2001.   Social History   Substance and Sexual Activity  Alcohol Use Yes   Alcohol/week: 3.0 standard drinks of alcohol   Types: 1 Glasses of wine, 1 Cans of beer, 1 Standard drinks or equivalent per week   Comment: Not every week    Family History:  Family History  Problem Relation Age of Onset   Cancer Mother        possible lung?   Dementia Mother    Alcohol abuse Mother    Arthritis Mother    Alcohol abuse Father    Early death Father    Varicose Veins Father    Cancer Sister        kidney   Diabetes Sister    COPD Sister    Kidney disease Sister    Obesity Sister    Cancer Paternal Grandfather        stomach and prostate   Diabetes Sister    Heart disease Sister    Hypertension Sister    COPD Sister    Stroke Maternal Uncle    Alcohol abuse Maternal Uncle    Arthritis Maternal Grandmother    ADD / ADHD Son    Learning disabilities Son    Alcohol abuse Brother    Anxiety disorder Brother    Alcohol abuse Maternal Uncle    Stroke Maternal Uncle    Heart disease Sister    Obesity Sister    Breast cancer Neg Hx     Past medical history, surgical history, medications, allergies, family history and social history reviewed with patient today and changes made to appropriate areas of the chart.   Review of Systems  Constitutional: Negative.   HENT: Negative.    Eyes: Negative.   Respiratory:  Positive for shortness of breath (with activity). Negative for cough, hemoptysis, sputum production and wheezing.   Cardiovascular:  Positive for palpitations (occasional fluttering) and leg swelling. Negative for chest pain, orthopnea, claudication and PND.  Gastrointestinal: Negative.   Genitourinary: Negative.   Musculoskeletal:  Positive for back pain, joint pain and myalgias. Negative for falls and neck pain.   Skin: Negative.   Neurological:  Positive for dizziness. Negative for tingling, tremors, sensory change, speech change, focal weakness, seizures, loss of consciousness, weakness and headaches.  Endo/Heme/Allergies: Negative.   Psychiatric/Behavioral:  Positive for depression. Negative for hallucinations, memory loss, substance abuse and suicidal ideas. The patient is not nervous/anxious and does not have insomnia.    All other ROS negative except what is listed above and in the HPI.      Objective:    BP (!) 146/72   Pulse 74   Temp 98.4 F (36.9 C) (Oral)   Resp 15   Ht 5' 4.72" (1.644 m)   Wt 186 lb 3.2 oz (84.5 kg)   SpO2 98%   BMI 31.25 kg/m   Wt Readings from Last 3 Encounters:  12/22/23 186 lb 3.2 oz (84.5 kg)  10/28/23 189 lb 12.8 oz (86.1 kg)  03/02/23 180 lb 3.2 oz (81.7 kg)    Physical Exam Vitals and nursing note reviewed.  Constitutional:      General: She is not in acute distress.    Appearance: Normal appearance. She is not ill-appearing, toxic-appearing or diaphoretic.  HENT:     Head: Normocephalic and atraumatic.     Right Ear: External ear normal.  Left Ear: External ear normal.     Nose: Nose normal.     Mouth/Throat:     Mouth: Mucous membranes are moist.     Pharynx: Oropharynx is clear.  Eyes:     General: No scleral icterus.       Right eye: No discharge.        Left eye: No discharge.     Extraocular Movements: Extraocular movements intact.     Conjunctiva/sclera: Conjunctivae normal.     Pupils: Pupils are equal, round, and reactive to light.  Cardiovascular:     Rate and Rhythm: Normal rate and regular rhythm.     Pulses: Normal pulses.     Heart sounds: Normal heart sounds. No murmur heard.    No friction rub. No gallop.  Pulmonary:     Effort: Pulmonary effort is normal. No respiratory distress.     Breath sounds: Normal breath sounds. No stridor. No wheezing, rhonchi or rales.  Chest:     Chest wall: No tenderness.   Musculoskeletal:        General: Normal range of motion.     Cervical back: Normal range of motion and neck supple.  Skin:    General: Skin is warm and dry.     Capillary Refill: Capillary refill takes less than 2 seconds.     Coloration: Skin is not jaundiced or pale.     Findings: No bruising, erythema, lesion or rash.  Neurological:     General: No focal deficit present.     Mental Status: She is alert and oriented to person, place, and time. Mental status is at baseline.  Psychiatric:        Mood and Affect: Mood normal.        Behavior: Behavior normal.        Thought Content: Thought content normal.        Judgment: Judgment normal.     Results for orders placed or performed in visit on 12/22/23  Microalbumin, Urine Waived   Collection Time: 12/22/23 10:57 AM  Result Value Ref Range   Microalb, Ur Waived 30 (H) 0 - 19 mg/L   Creatinine, Urine Waived 100 10 - 300 mg/dL   Microalb/Creat Ratio <30 <30 mg/g      Assessment & Plan:   Problem List Items Addressed This Visit       Cardiovascular and Mediastinum   HTN (hypertension)   BP still running a little high. Will work on weight loss and recheck in a month. May need to adjust medicine.       Relevant Medications   chlorthalidone (HYGROTON) 25 MG tablet   Other Relevant Orders   CBC with Differential/Platelet   Comprehensive metabolic panel   Microalbumin, Urine Waived (Completed)   Abdominal aortic atherosclerosis (HCC)   Will keep BP and cholesterol under good control. Continue to monitor. Call with any concerns.       Relevant Medications   chlorthalidone (HYGROTON) 25 MG tablet   Other Relevant Orders   CBC with Differential/Platelet   Comprehensive metabolic panel     Endocrine   IFG (impaired fasting glucose)   Rechecking labs today. Await results.       Relevant Orders   Bayer DCA Hb A1c Waived     Other   Hyperlipidemia   Rechecking labs today. Await results. Treat as needed.        Relevant Medications   chlorthalidone (HYGROTON) 25 MG tablet   Other Relevant Orders  CBC with Differential/Platelet   Comprehensive metabolic panel   Lipid Panel w/o Chol/HDL Ratio   Depression   Will start her on Wellbutrin. Call with any concerns. Continue to monitor.       Relevant Medications   buPROPion (WELLBUTRIN SR) 150 MG 12 hr tablet   Other Relevant Orders   CBC with Differential/Platelet   Comprehensive metabolic panel   TSH   Vitamin D deficiency   Rechecking labs today. Await results. Treat as needed.       Relevant Orders   CBC with Differential/Platelet   Comprehensive metabolic panel   VITAMIN D 25 Hydroxy (Vit-D Deficiency, Fractures)   Obesity   Will start on wellbutrin. Recheck 1 month. Call with any concerns.       Other Visit Diagnoses       Routine general medical examination at a health care facility    -  Primary   Vaccines up to date. Screening labs checked today. Mammo, DEXA and colonoscopy up to date. Continue diet and exericse. Call with any concerns.        Follow up plan: Return in about 4 weeks (around 01/19/2024).   LABORATORY TESTING:  - Pap smear: not applicable  IMMUNIZATIONS:   - Tdap: Tetanus vaccination status reviewed: last tetanus booster within 10 years. - Influenza: Up to date - Pneumovax: Up to date - Prevnar: Up to date - COVID: Refused - HPV: Not applicable - Shingrix vaccine: Up to date  SCREENING: -Mammogram: Up to date  - Colonoscopy: Up to date  - Bone Density: Up to date    PATIENT COUNSELING:   Advised to take 1 mg of folate supplement per day if capable of pregnancy.   Sexuality: Discussed sexually transmitted diseases, partner selection, use of condoms, avoidance of unintended pregnancy  and contraceptive alternatives.   Advised to avoid cigarette smoking.  I discussed with the patient that most people either abstain from alcohol or drink within safe limits (<=14/week and <=4 drinks/occasion for  males, <=7/weeks and <= 3 drinks/occasion for females) and that the risk for alcohol disorders and other health effects rises proportionally with the number of drinks per week and how often a drinker exceeds daily limits.  Discussed cessation/primary prevention of drug use and availability of treatment for abuse.   Diet: Encouraged to adjust caloric intake to maintain  or achieve ideal body weight, to reduce intake of dietary saturated fat and total fat, to limit sodium intake by avoiding high sodium foods and not adding table salt, and to maintain adequate dietary potassium and calcium preferably from fresh fruits, vegetables, and low-fat dairy products.    stressed the importance of regular exercise  Injury prevention: Discussed safety belts, safety helmets, smoke detector, smoking near bedding or upholstery.   Dental health: Discussed importance of regular tooth brushing, flossing, and dental visits.    NEXT PREVENTATIVE PHYSICAL DUE IN 1 YEAR. Return in about 4 weeks (around 01/19/2024).

## 2023-12-22 NOTE — Assessment & Plan Note (Signed)
 Will start her on Wellbutrin. Call with any concerns. Continue to monitor.

## 2023-12-22 NOTE — Assessment & Plan Note (Signed)
 BP still running a little high. Will work on weight loss and recheck in a month. May need to adjust medicine.

## 2023-12-22 NOTE — Assessment & Plan Note (Signed)
 Will start on wellbutrin. Recheck 1 month. Call with any concerns.

## 2023-12-22 NOTE — Assessment & Plan Note (Signed)
 Rechecking labs today. Await results.

## 2023-12-22 NOTE — Assessment & Plan Note (Signed)
 Will keep BP and cholesterol under good control. Continue to monitor. Call with any concerns.

## 2023-12-23 ENCOUNTER — Encounter: Payer: Self-pay | Admitting: Family Medicine

## 2023-12-23 LAB — COMPREHENSIVE METABOLIC PANEL
ALT: 17 IU/L (ref 0–32)
AST: 15 IU/L (ref 0–40)
Albumin: 4.3 g/dL (ref 3.8–4.8)
Alkaline Phosphatase: 84 IU/L (ref 44–121)
BUN/Creatinine Ratio: 28 (ref 12–28)
BUN: 18 mg/dL (ref 8–27)
Bilirubin Total: 0.4 mg/dL (ref 0.0–1.2)
CO2: 28 mmol/L (ref 20–29)
Calcium: 9.6 mg/dL (ref 8.7–10.3)
Chloride: 100 mmol/L (ref 96–106)
Creatinine, Ser: 0.64 mg/dL (ref 0.57–1.00)
Globulin, Total: 2.5 g/dL (ref 1.5–4.5)
Glucose: 104 mg/dL — ABNORMAL HIGH (ref 70–99)
Potassium: 4.1 mmol/L (ref 3.5–5.2)
Sodium: 139 mmol/L (ref 134–144)
Total Protein: 6.8 g/dL (ref 6.0–8.5)
eGFR: 93 mL/min/{1.73_m2} (ref >59)

## 2023-12-23 LAB — CBC WITH DIFFERENTIAL/PLATELET
Basophils Absolute: 0 10*3/uL (ref 0.0–0.2)
Basos: 1 %
EOS (ABSOLUTE): 0.1 10*3/uL (ref 0.0–0.4)
Eos: 1 %
Hematocrit: 47.2 % — ABNORMAL HIGH (ref 34.0–46.6)
Hemoglobin: 15.6 g/dL (ref 11.1–15.9)
Immature Grans (Abs): 0 10*3/uL (ref 0.0–0.1)
Immature Granulocytes: 0 %
Lymphocytes Absolute: 1.9 10*3/uL (ref 0.7–3.1)
Lymphs: 31 %
MCH: 31.3 pg (ref 26.6–33.0)
MCHC: 33.1 g/dL (ref 31.5–35.7)
MCV: 95 fL (ref 79–97)
Monocytes Absolute: 0.5 10*3/uL (ref 0.1–0.9)
Monocytes: 8 %
Neutrophils Absolute: 3.6 10*3/uL (ref 1.4–7.0)
Neutrophils: 59 %
Platelets: 230 10*3/uL (ref 150–450)
RBC: 4.99 x10E6/uL (ref 3.77–5.28)
RDW: 12.6 % (ref 11.7–15.4)
WBC: 6.1 10*3/uL (ref 3.4–10.8)

## 2023-12-23 LAB — LIPID PANEL W/O CHOL/HDL RATIO
Cholesterol, Total: 255 mg/dL — ABNORMAL HIGH (ref 100–199)
HDL: 49 mg/dL (ref >39)
LDL Chol Calc (NIH): 160 mg/dL — ABNORMAL HIGH (ref 0–99)
Triglycerides: 249 mg/dL — ABNORMAL HIGH (ref 0–149)
VLDL Cholesterol Cal: 46 mg/dL — ABNORMAL HIGH (ref 5–40)

## 2023-12-23 LAB — VITAMIN D 25 HYDROXY (VIT D DEFICIENCY, FRACTURES): Vit D, 25-Hydroxy: 34.5 ng/mL (ref 30.0–100.0)

## 2023-12-23 LAB — TSH: TSH: 1.69 u[IU]/mL (ref 0.450–4.500)

## 2023-12-24 ENCOUNTER — Ambulatory Visit (INDEPENDENT_AMBULATORY_CARE_PROVIDER_SITE_OTHER): Payer: Medicare Other | Admitting: Emergency Medicine

## 2023-12-24 VITALS — Ht 64.72 in | Wt 186.0 lb

## 2023-12-24 DIAGNOSIS — Z0000 Encounter for general adult medical examination without abnormal findings: Secondary | ICD-10-CM | POA: Diagnosis not present

## 2023-12-24 NOTE — Progress Notes (Signed)
 Subjective:   Mary Galvan is a 73 y.o. who presents for a Medicare Wellness preventive visit.  Visit Complete: Virtual I connected with  Mary Galvan on 12/24/23 by a audio enabled telemedicine application and verified that I am speaking with the correct person using two identifiers.  Patient Location: Home  Provider Location: Home Office  I discussed the limitations of evaluation and management by telemedicine. The patient expressed understanding and agreed to proceed.  Vital Signs: Because this visit was a virtual/telehealth visit, some criteria may be missing or patient reported. Any vitals not documented were not able to be obtained and vitals that have been documented are patient reported.  VideoDeclined- This patient declined Librarian, academic. Therefore the visit was completed with audio only.  Persons Participating in Visit: Patient.  AWV Questionnaire: No: Patient Medicare AWV questionnaire was not completed prior to this visit.  Cardiac Risk Factors include: advanced age (>51men, >40 women);hypertension;dyslipidemia;obesity (BMI >30kg/m2)     Objective:    Today's Vitals   12/24/23 1509  Weight: 186 lb (84.4 kg)  Height: 5' 4.72" (1.644 m)   Body mass index is 31.22 kg/m.     12/24/2023    3:32 PM 03/02/2023   10:39 AM 02/16/2023   10:00 AM 12/09/2022    3:05 PM 05/02/2022    8:43 AM 11/05/2021    7:13 AM 10/10/2021   12:06 PM  Advanced Directives  Does Patient Have a Medical Advance Directive? Yes Unable to assess, patient is non-responsive or altered mental status No No Yes No No  Type of Advance Directive Healthcare Power of [place];Living will    Healthcare Power of [place];Living will    Does patient want to make changes to medical advance directive? No - Patient declined    No - Patient declined    Copy of Healthcare Power of Attorney in Chart? No - copy requested    Yes - validated most recent copy scanned in chart  (See row information)    Would patient like information on creating a medical advance directive?  No - Patient declined No - Patient declined No - Patient declined No - Patient declined  No - Patient declined    Current Medications (verified) Outpatient Encounter Medications as of 12/24/2023  Medication Sig   chlorthalidone (HYGROTON) 25 MG tablet Take 1 tablet (25 mg total) by mouth daily.   Cholecalciferol (VITAMIN D3) 2000 UNITS TABS Take 2,000 Units by mouth daily.    Multiple Vitamins-Minerals (PRESERVISION AREDS 2 PO) Take 1 tablet by mouth 2 (two) times daily.   NONFORMULARY OR COMPOUNDED ITEM daily. Azelaic Acid/metronidazole/ivermectin  topical   buPROPion (WELLBUTRIN SR) 150 MG 12 hr tablet Take 1 pill a day for 1 week, then increase to 1 pill BID (Patient not taking: Reported on 12/24/2023)   No facility-administered encounter medications on file as of 12/24/2023.    Allergies (verified) Patient has no known allergies.   History: Past Medical History:  Diagnosis Date   Actinic keratosis 03/11/2023   L proximal lat pretibial, EDC   Anxiety    Arthritis    thumbs   Basal cell carcinoma 10/20/2017   just under left brow   Cataract 2024   Lens implant surgery complete on both eyes   Depression    Dysplastic nevus 01/19/2018   right distal dorsum foot   Family history of adverse reaction to anesthesia    Fatty liver disease, nonalcoholic    GERD (gastroesophageal reflux disease)  Heart murmur    Hyperlipidemia    Hypertension    Kidney cysts    Motion sickness    reading in cars   Ocular migraine    PONV (postoperative nausea and vomiting)    slow to wake   Rosacea    Seasonal affective disorder (HCC)    Seasonal allergies    Seizures (HCC)    x1 - after head trauma. 1970's   Vitreous detachment of left eye 04/2011   [facility]   Past Surgical History:  Procedure Laterality Date   BASAL CELL CARCINOMA EXCISION Left    removed from above left eye     BROW LIFT Bilateral 05/02/2022   Procedure: BLEPHAROPLASTY UPPER EYELID; W/EXCESS SKIN BROW PTOSIS REPAIR BILATERAL;  Surgeon: Imagene Riches, MD;  Location: [facility];  Service: Ophthalmology;  Laterality: Bilateral;   CATARACT EXTRACTION W/PHACO Left 02/16/2023   Procedure: CATARACT EXTRACTION PHACO AND INTRAOCULAR LENS PLACEMENT (IOC) LEFT;  Surgeon: Nevada Crane, MD;  Location: [facility];  Service: Ophthalmology;  Laterality: Left;  4.01 0:23.2   CATARACT EXTRACTION W/PHACO Right 03/02/2023   Procedure: CATARACT EXTRACTION PHACO AND INTRAOCULAR LENS PLACEMENT (IOC) RIGHT eyhance toric 2.46 00:25.3;  Surgeon: Nevada Crane, MD;  Location: [facility];  Service: Ophthalmology;  Laterality: Right;   CHOLECYSTECTOMY  2015   COLONOSCOPY WITH PROPOFOL N/A 05/10/2015   Procedure: COLONOSCOPY WITH PROPOFOL;  Surgeon: Midge Minium, MD;  Location: [facility];  Service: Endoscopy;  Laterality: N/A;  WITH BIOPSY-- SIGMOID COLON POLYP  X  4 DESCENDING COLON POLYP   COLONOSCOPY WITH PROPOFOL N/A 11/05/2021   Procedure: COLONOSCOPY WITH PROPOFOL;  Surgeon: Toney Reil, MD;  Location: [facility] END[facility];  Service: Gastroenterology;  Laterality: N/A;   COSMETIC SURGERY  2024   blepharoplasty to help with my visual field   DILATATION & CURETTAGE/HYSTEROSCOPY WITH MYOSURE N/A 03/28/2016   Procedure: DILATATION & CURETTAGE/HYSTEROSCOPY WITH MYOSURE;  Surgeon: Suzy Bouchard, MD;  Location: [facility] OR[facility];  Service: Gynecology;  Laterality: N/A;   ESOPHAGOGASTRODUODENOSCOPY (EGD) WITH PROPOFOL N/A 09/02/2017   Procedure: ESOPHAGOGASTRODUODENOSCOPY (EGD) WITH PROPOFOL;  Surgeon: Toney Reil, MD;  Location: [facility];  Service: Endoscopy;  Laterality: N/A;   EYE SURGERY  2024   blepharoplasty and lens implants   MELANOMA EXCISION     pre melenoma excision on right foot    OTHER SURGICAL HISTORY     Uterine Polyp   Family History   Problem Relation Age of Onset   Cancer Mother        possible lung?   Dementia Mother    Alcohol abuse Mother    Arthritis Mother    Alcohol abuse Father    Early death Father    Varicose Veins Father    Cancer Sister        kidney   Diabetes Sister    COPD Sister    Kidney disease Sister    Obesity Sister    Cancer Paternal Grandfather        stomach and prostate   Diabetes Sister    Heart disease Sister    Hypertension Sister    COPD Sister    Stroke Maternal Uncle    Alcohol abuse Maternal Uncle    Arthritis Maternal Grandmother    ADD / ADHD Son    Learning disabilities Son    Alcohol abuse Brother    Anxiety disorder Brother    Alcohol abuse Maternal Uncle  Stroke Maternal Uncle    Heart disease Sister    Obesity Sister    Breast cancer Neg Hx    Social History   Socioeconomic History   Marital status: Married    Spouse name: Alvino Chapel   Number of children: 2   Years of education: Not on file   Highest education level: Associate degree: occupational, Scientist, product/process development, or vocational program  Occupational History   Occupation: retired  Tobacco Use   Smoking status: Former    Current packs/day: 0.00    Average packs/day: 1 pack/day for 30.0 years (30.0 ttl pk-yrs)    Types: Cigarettes    Start date: 03/13/1970    Quit date: 03/13/2000    Years since quitting: 23.7   Smokeless tobacco: Never   Tobacco comments:    I quit several times and started back until finally quitting for good in 2001.  Vaping Use   Vaping status: Never Used  Substance and Sexual Activity   Alcohol use: Not Currently    Comment: 1 drink 1-2 times per month   Drug use: No   Sexual activity: Not Currently    Partners: Male  Other Topics Concern   Not on file  Social History Narrative   Not on file   Social Drivers of Health   Financial Resource Strain: Low Risk  (12/24/2023)   Overall Financial Resource Strain (CARDIA)    Difficulty of Paying Living Expenses: Not hard at all  Food  Insecurity: No Food Insecurity (12/24/2023)   Hunger Vital Sign    Worried About Running Out of Food in the Last Year: Never true    Ran Out of Food in the Last Year: Never true  Transportation Needs: No Transportation Needs (12/24/2023)   PRAPARE - Administrator, Civil Service (Medical): No    Lack of Transportation (Non-Medical): No  Physical Activity: Insufficiently Active (12/24/2023)   Exercise Vital Sign    Days of Exercise per Week: 7 days    Minutes of Exercise per Session: 20 min  Stress: No Stress Concern Present (12/24/2023)   [employer] of Occupational Health - Occupational Stress Questionnaire    Feeling of Stress : Only a little  Social Connections: Moderately Isolated (12/24/2023)   Social Connection and Isolation Panel [NHANES]    Frequency of Communication with Friends and Family: More than three times a week    Frequency of Social Gatherings with Friends and Family: Twice a week    Attends Religious Services: Never    Database administrator or Organizations: No    Attends Banker Meetings: Never    Marital Status: Married    Tobacco Counseling Counseling given: Not Answered Tobacco comments: I quit several times and started back until finally quitting for good in 2001.    Clinical Intake:  Pre-visit preparation completed: Yes  Pain : No/denies pain     BMI - recorded: 31.22 Nutritional Status: BMI > 30  Obese Nutritional Risks: None Diabetes: No  How often do you need to have someone help you when you read instructions, pamphlets, or other written materials from your doctor or pharmacy?: 1 - Never  Interpreter Needed?: No  Information entered by :: Tora Kindred, CMA   Activities of Daily Living     12/24/2023    3:14 PM 03/02/2023   10:31 AM  In your present state of health, do you have any difficulty performing the following activities:  Hearing? 0 0  Vision? 0 0  Difficulty  concentrating or making decisions? 0 0   Walking or climbing stairs? 0 0  Dressing or bathing? 0 0  Doing errands, shopping? 0   Preparing Food and eating ? N   Using the Toilet? N   In the past six months, have you accidently leaked urine? N   Do you have problems with loss of bowel control? N   Managing your Medications? N   Managing your Finances? N   Housekeeping or managing your Housekeeping? N     Patient Care Team: Dorcas Carrow, DO as PCP - General (Family Medicine) Midge Minium, MD as Consulting Physician (Gastroenterology) Schermerhorn, Ihor Austin, MD as Referring Physician (Obstetrics and Gynecology) [facility], [facility], [facility] (Dermatology) Lia Hopping, MD as Referring Physician (Ophthalmology)  Indicate any recent Medical Services you may have received from other than [facility] providers in the past year (date may be approximate).     Assessment:   This is a routine wellness examination for [place].  Hearing/Vision screen Hearing Screening - Comments:: Denies hearing loss Vision Screening - Comments:: Gets eye exams, Dr. Willey Blade  [place]   Goals Addressed             This Visit's Progress    Patient Stated       Lose 10-15 pounds     COMPLETED: Weight (lb) < 200 lb (90.7 kg)   186 lb (84.4 kg)      Depression Screen     12/24/2023    3:27 PM 12/22/2023   10:46 AM 10/28/2023   11:26 AM 12/16/2022   10:21 AM 12/16/2022   10:12 AM 12/09/2022    3:01 PM 03/11/2022    2:12 PM  PHQ 2/9 Scores  PHQ - 2 Score 2 0 0 2 2 0 0  PHQ- 9 Score 2 1 2 5 5  0 1    Fall Risk     12/24/2023    3:33 PM 12/22/2023   10:46 AM 10/28/2023   11:26 AM 12/16/2022   10:20 AM 12/16/2022   10:14 AM  Fall Risk   Falls in the past year? 0 0 1 1 1   Number falls in past yr: 0 0 0 0 0  Injury with Fall? 0 0 0 0 1  Comment     scraped knee  Risk for fall due to : No Fall Risks No Fall Risks No Fall Risks History of fall(s)   Follow up Falls prevention discussed;Falls evaluation completed  Falls evaluation completed Falls evaluation completed Falls evaluation completed Falls evaluation completed    MEDICARE RISK AT HOME:  Medicare Risk at Home Any stairs in or around the home?: Yes If so, are there any without handrails?: No Home free of loose throw rugs in walkways, pet beds, electrical cords, etc?: Yes Adequate lighting in your home to reduce risk of falls?: Yes Life alert?: No Use of a cane, walker or w/c?: No Grab bars in the bathroom?: Yes Shower chair or bench in shower?: No Elevated toilet seat or a handicapped toilet?: Yes  TIMED UP AND GO:  Was the test performed?  No  Cognitive Function: 6CIT completed        12/24/2023    3:35 PM 12/09/2022    3:15 PM 08/13/2020    2:01 PM 05/12/2018   10:24 AM 05/07/2017    9:01 AM  6CIT Screen  What Year? 0 points 0 points 0 points 0 points 0 points  What month? 0 points 0  points 0 points 0 points 0 points  What time? 0 points 0 points 0 points 0 points 0 points  Count back from 20 0 points 0 points 0 points 0 points 0 points  Months in reverse 0 points 0 points 0 points 0 points 0 points  Repeat phrase 0 points 0 points 0 points 0 points 0 points  Total Score 0 points 0 points 0 points 0 points 0 points    Immunizations Immunization History  Administered Date(s) Administered   Fluad Quad(high Dose 65+) 07/25/2019, 08/13/2020, 09/10/2021   Fluad Trivalent(High Dose 65+) 08/06/2023   Fluzone Influenza virus vaccine,trivalent (IIV3), split virus 10/22/2011   Influenza, High Dose Seasonal PF 09/02/2022   Influenza,inj,Quad PF,6+ Mos 10/15/2012   Influenza-Unspecified 08/18/2014, 09/20/2018, 09/02/2022   PFIZER(Purple Top)SARS-COV-2 Vaccination 11/22/2019, 12/13/2019, 08/02/2020, 09/02/2022   Pfizer Covid-19 Vaccine Bivalent Booster 40yrs & up 10/11/2021   Pfizer(Comirnaty)Fall Seasonal Vaccine 12 years and older 09/02/2022   Pneumococcal Conjugate-13 05/03/2015   Pneumococcal Polysaccharide-23 04/21/2016   Td  12/16/2022   Tdap 05/16/2013   Zoster Recombinant(Shingrix) 03/09/2018, 06/04/2018    Screening Tests Health Maintenance  Topic Date Due   Medicare Annual Wellness (AWV)  12/23/2024   MAMMOGRAM  12/13/2025   Colonoscopy  11/05/2026   DEXA SCAN  12/10/2026   DTaP/Tdap/Td (3 - Td or Tdap) 12/15/2032   Pneumonia Vaccine 33+ Years old  Completed   INFLUENZA VACCINE  Completed   Hepatitis C Screening  Completed   Zoster Vaccines- Shingrix  Completed   HPV VACCINES  Aged Out   COVID-19 Vaccine  Discontinued    Health Maintenance  There are no preventive care reminders to display for this patient. Health Maintenance Items Addressed: See Nurse Notes  Additional Screening:  Vision Screening: Recommended annual ophthalmology exams for early detection of glaucoma and other disorders of the eye.  Dental Screening: Recommended annual dental exams for proper oral hygiene  Community Resource Referral / Chronic Care Management: CRR required this visit?  No   CCM required this visit?  No     Plan:     I have personally reviewed and noted the following in the patient's chart:   Medical and social history Use of alcohol, tobacco or illicit drugs  Current medications and supplements including opioid prescriptions. Patient is not currently taking opioid prescriptions. Functional ability and status Nutritional status Physical activity Advanced directives List of other physicians Hospitalizations, surgeries, and ER visits in previous 12 months Vitals Screenings to include cognitive, depression, and falls Referrals and appointments  In addition, I have reviewed and discussed with patient certain preventive protocols, quality metrics, and best practice recommendations. A written personalized care plan for preventive services as well as general preventive health recommendations were provided to patient.     Tora Kindred, CMA   12/24/2023   After Visit Summary: (MyChart) Due to  this being a telephonic visit, the after visit summary with patients personalized plan was offered to patient via MyChart   Notes:  Declined covid vaccine

## 2023-12-24 NOTE — Patient Instructions (Addendum)
 Mary Galvan , Thank you for taking time to come for your Medicare Wellness Visit. I appreciate your ongoing commitment to your health goals. Please review the following plan we discussed and let me know if I can assist you in the future.   Referrals/Orders/Follow-Ups/Clinician Recommendations: Keep up the good work!!  This is a list of the screening recommended for you and due dates:  Health Maintenance  Topic Date Due   Medicare Annual Wellness Visit  12/23/2024   Mammogram  12/13/2025   Colon Cancer Screening  11/05/2026   DEXA scan (bone density measurement)  12/10/2026   DTaP/Tdap/Td vaccine (3 - Td or Tdap) 12/15/2032   Pneumonia Vaccine  Completed   Flu Shot  Completed   Hepatitis C Screening  Completed   Zoster (Shingles) Vaccine  Completed   HPV Vaccine  Aged Out   COVID-19 Vaccine  Discontinued    Advanced directives: (Copy Requested) Please bring a copy of your health care power of attorney and living will to the office to be added to your chart at your convenience. You can mail to [facility] [address] [place], [place] [address] or email to [email or web address]  Next Medicare Annual Wellness Visit scheduled for next year: Yes, 01/05/25 @ 3:10pm (video visit)

## 2024-01-06 ENCOUNTER — Telehealth: Payer: Self-pay

## 2024-01-06 NOTE — Telephone Encounter (Signed)
 Concerned that she might have not fell well but unsure if related to medication or vacation related. On Saturday that was the day she was to increased to twice daily, under more stress and walked quite a lot. Had shaky feeling and just felt off. Leading up to that she had not slept well and had been very much on the go. She did not start the second dose the day she should have and chose to wait till she returned home. Since home no more shaky feelings, feels herself, slept 9 hours last night, is only waking at night only to use the restroom and can go right back to sleep. Is choosing to wait until Friday to start the second when her husband will be home to watch her to she how she reacts.  She does mention that after doing some research on Wellbutrin that she should not take any dose close to bed time or within 8 hours of her first dose. Also mentions that some on her anxiety was [place] but that it is still there and wonders how it will do when increasing to the second dose.   She also mentioned that she had an increase in caffeine and some minor alcohol use. Patient aware I will discuss with provider and return a call to her with additional information.

## 2024-01-06 NOTE — Telephone Encounter (Signed)
 She can stay on the 1 pill in the AM if she'd like- it's a 12 hour medicine, so it should be out of her system in the evening if she's only taking it in the AM. We can talk about switching to the extended release when I see her next time, but the BID dose is generally better for weight loss. Thanks!

## 2024-01-06 NOTE — Telephone Encounter (Signed)
 Spoke with patient. She will continue with the once daily dosing for now.

## 2024-01-06 NOTE — Telephone Encounter (Signed)
 Copied from CRM [id number]. Topic: Clinical - Medication Question >> Jan 04, 2024  3:36 PM Patsy Lager T wrote: Reason for CRM: patient called stated she was taking buPROPion (WELLBUTRIN SR) 150 MG 12 hr tablet for almost 2 weeks and she went on a trip but while away she was having feelings of exhaustion, shaky hands and shaky to her core. When she returned on Saturday she said she felt better. She didn't get much sleep but when she did fall asleep she would wake up out of her sleep. Patient wants to know if she should start taking the 2 tablets daily as prescribed or if she should just continue to take 1 tablet daily.

## 2024-01-21 ENCOUNTER — Encounter: Payer: Self-pay | Admitting: Family Medicine

## 2024-01-21 ENCOUNTER — Ambulatory Visit (INDEPENDENT_AMBULATORY_CARE_PROVIDER_SITE_OTHER): Admitting: Family Medicine

## 2024-01-21 VITALS — BP 141/85 | HR 76 | Ht 65.0 in | Wt 184.2 lb

## 2024-01-21 DIAGNOSIS — E66811 Obesity, class 1: Secondary | ICD-10-CM

## 2024-01-21 DIAGNOSIS — E6609 Other obesity due to excess calories: Secondary | ICD-10-CM

## 2024-01-21 DIAGNOSIS — Z6831 Body mass index (BMI) 31.0-31.9, adult: Secondary | ICD-10-CM

## 2024-01-21 DIAGNOSIS — F3342 Major depressive disorder, recurrent, in full remission: Secondary | ICD-10-CM

## 2024-01-21 DIAGNOSIS — I10 Essential (primary) hypertension: Secondary | ICD-10-CM

## 2024-01-21 MED ORDER — BUPROPION HCL ER (SR) 150 MG PO TB12: 150.0000 mg | ORAL_TABLET | Freq: Two times a day (BID) | ORAL | 1 refills | Status: DC

## 2024-01-21 NOTE — Progress Notes (Signed)
 BP (!) 141/85 (BP Location: Left Arm, Patient Position: Sitting, Cuff Size: Normal)   Pulse 76   Ht 5\' 5"  (1.651 m)   Wt 184 lb 3.2 oz (83.6 kg)   SpO2 98%   BMI 30.65 kg/m    Subjective:    Patient ID: Mary Galvan, female    DOB: 1951-02-08, 73 y.o.   MRN: [id number]  HPI: Mary Galvan is a 73 y.o. female  Chief Complaint  Patient presents with   Hypertension   Depression   Obesity   OBESITY Duration: chronic Previous attempts at weight loss: yes Complications of obesity: HTN, HLD, IFG Weight loss goal: 150s Weight loss to date: 6 lbs Requesting obesity pharmacotherapy: no Current weight loss supplements/medications: no Previous weight loss supplements/meds: no  DEPRESSION Mood status: controlled Satisfied with current treatment?: unsure Symptom severity: mild  Duration of current treatment : about a month Side effects: tremor, dry mouth Medication compliance: good compliance Psychotherapy/counseling: no  Previous psychiatric medications: wellbutrin Depressed mood: no Anxious mood: no Anhedonia: no Significant weight loss or gain: no Insomnia: no Fatigue: no Feelings of worthlessness or guilt: no Impaired concentration/indecisiveness: no Suicidal ideations: no Hopelessness: no Crying spells: no    01/21/2024    3:35 PM 12/24/2023    3:27 PM 12/22/2023   10:46 AM 10/28/2023   11:26 AM 12/16/2022   10:21 AM  Depression screen PHQ 2/9  Decreased Interest 0 1 0 0 1  Down, Depressed, Hopeless 0 1 0 0 1  PHQ - 2 Score 0 2 0 0 2  Altered sleeping 0 0 0 0 0  Tired, decreased energy 0 0 1 1 1   Change in appetite 0 0 0 1 1  Feeling bad or failure about yourself  0 0 0 0 1  Trouble concentrating 0 0 0 0 0  Moving slowly or fidgety/restless 0 0 0 0 0  Suicidal thoughts 0 0 0 0 0  PHQ-9 Score 0 2 1 2 5   Difficult doing work/chores  Not difficult at all  Somewhat difficult       01/21/2024    3:35 PM 12/22/2023   10:46 AM 10/28/2023   11:26 AM  12/16/2022   10:25 AM  GAD 7 : Generalized Anxiety Score  Nervous, Anxious, on Edge 0 0 0 0  Control/stop worrying 0 0 0 0  Worry too much - different things 0 0 0 0  Trouble relaxing 0 0 0 0  Restless 0 0 0 0  Easily annoyed or irritable 0 0 0 0  Afraid - awful might happen 0 0 0 0  Total GAD 7 Score 0 0 0 0  Anxiety Difficulty   Not difficult at all Not difficult at all    HYPERTENSION  Hypertension status: stable  Satisfied with current treatment? yes Duration of hypertension: chronic BP monitoring frequency:  a few times a month BP range: 117/69, 111/75 BP medication side effects:  no Medication compliance: good compliance Previous BP meds:chlorthalidone Aspirin: no Recurrent headaches: no Visual changes: no Palpitations: no Dyspnea: no Chest pain: no Lower extremity edema: no Dizzy/lightheaded: no   Relevant past medical, surgical, family and social history reviewed and updated as indicated. Interim medical history since our last visit reviewed. Allergies and medications reviewed and updated.  Review of Systems  Constitutional: Negative.   Respiratory: Negative.    Cardiovascular:  Positive for palpitations (mild in the past couple of days). Negative for chest pain and leg swelling.  Musculoskeletal: Negative.  Neurological: Negative.   Psychiatric/Behavioral: Negative.      Per HPI unless specifically indicated above     Objective:    BP (!) 141/85 (BP Location: Left Arm, Patient Position: Sitting, Cuff Size: Normal)   Pulse 76   Ht 5\' 5"  (1.651 m)   Wt 184 lb 3.2 oz (83.6 kg)   SpO2 98%   BMI 30.65 kg/m   Wt Readings from Last 3 Encounters:  01/21/24 184 lb 3.2 oz (83.6 kg)  12/24/23 186 lb (84.4 kg)  12/22/23 186 lb 3.2 oz (84.5 kg)    Physical Exam Vitals and nursing note reviewed.  Constitutional:      General: She is not in acute distress.    Appearance: Normal appearance. She is not ill-appearing, toxic-appearing or diaphoretic.  HENT:      Head: Normocephalic and atraumatic.     Right Ear: External ear normal.     Left Ear: External ear normal.     Nose: Nose normal.     Mouth/Throat:     Mouth: Mucous membranes are moist.     Pharynx: Oropharynx is clear.  Eyes:     General: No scleral icterus.       Right eye: No discharge.        Left eye: No discharge.     Extraocular Movements: Extraocular movements intact.     Conjunctiva/sclera: Conjunctivae normal.     Pupils: Pupils are equal, round, and reactive to light.  Cardiovascular:     Rate and Rhythm: Normal rate and regular rhythm.     Pulses: Normal pulses.     Heart sounds: Normal heart sounds. No murmur heard.    No friction rub. No gallop.  Pulmonary:     Effort: Pulmonary effort is normal. No respiratory distress.     Breath sounds: Normal breath sounds. No stridor. No wheezing, rhonchi or rales.  Chest:     Chest wall: No tenderness.  Musculoskeletal:        General: Normal range of motion.     Cervical back: Normal range of motion and neck supple.  Skin:    General: Skin is warm and dry.     Capillary Refill: Capillary refill takes less than 2 seconds.     Coloration: Skin is not jaundiced or pale.     Findings: No bruising, erythema, lesion or rash.  Neurological:     General: No focal deficit present.     Mental Status: She is alert and oriented to person, place, and time. Mental status is at baseline.  Psychiatric:        Mood and Affect: Mood normal.        Behavior: Behavior normal.        Thought Content: Thought content normal.        Judgment: Judgment normal.     Results for orders placed or performed in visit on 12/22/23  CBC with Differential/Platelet   Collection Time: 12/22/23 10:55 AM  Result Value Ref Range   WBC 6.1 3.4 - 10.8 x10E3/uL   RBC 4.99 3.77 - 5.28 x10E6/uL   Hemoglobin 15.6 11.1 - 15.9 g/dL   Hematocrit 13.0 (H) 86.5 - 46.6 %   MCV 95 79 - 97 fL   MCH 31.3 26.6 - 33.0 pg   MCHC 33.1 31.5 - 35.7 g/dL   RDW 78.4  69.6 - 29.5 %   Platelets 230 150 - 450 x10E3/uL   Neutrophils 59 Not Estab. %   Lymphs 31 Not  Estab. %   Monocytes 8 Not Estab. %   Eos 1 Not Estab. %   Basos 1 Not Estab. %   Neutrophils Absolute 3.6 1.4 - 7.0 x10E3/uL   Lymphocytes Absolute 1.9 0.7 - 3.1 x10E3/uL   Monocytes Absolute 0.5 0.1 - 0.9 x10E3/uL   EOS (ABSOLUTE) 0.1 0.0 - 0.4 x10E3/uL   Basophils Absolute 0.0 0.0 - 0.2 x10E3/uL   Immature Granulocytes 0 Not Estab. %   Immature Grans (Abs) 0.0 0.0 - 0.1 x10E3/uL  Comprehensive metabolic panel   Collection Time: 12/22/23 10:55 AM  Result Value Ref Range   Glucose 104 (H) 70 - 99 mg/dL   BUN 18 8 - 27 mg/dL   Creatinine, Ser 1.61 0.57 - 1.00 mg/dL   eGFR 93 >09 UE/AVW/0.98   BUN/Creatinine Ratio 28 12 - 28   Sodium 139 134 - 144 mmol/L   Potassium 4.1 3.5 - 5.2 mmol/L   Chloride 100 96 - 106 mmol/L   CO2 28 20 - 29 mmol/L   Calcium 9.6 8.7 - 10.3 mg/dL   Total Protein 6.8 6.0 - 8.5 g/dL   Albumin 4.3 3.8 - 4.8 g/dL   Globulin, Total 2.5 1.5 - 4.5 g/dL   Bilirubin Total 0.4 0.0 - 1.2 mg/dL   Alkaline Phosphatase 84 44 - 121 IU/L   AST 15 0 - 40 IU/L   ALT 17 0 - 32 IU/L  Lipid Panel w/o Chol/HDL Ratio   Collection Time: 12/22/23 10:55 AM  Result Value Ref Range   Cholesterol, Total 255 (H) 100 - 199 mg/dL   Triglycerides 119 (H) 0 - 149 mg/dL   HDL 49 >14 mg/dL   VLDL Cholesterol Cal 46 (H) 5 - 40 mg/dL   LDL Chol Calc (NIH) 782 (H) 0 - 99 mg/dL  TSH   Collection Time: 12/22/23 10:55 AM  Result Value Ref Range   TSH 1.690 0.450 - 4.500 uIU/mL  VITAMIN D 25 Hydroxy (Vit-D Deficiency, Fractures)   Collection Time: 12/22/23 10:55 AM  Result Value Ref Range   Vit D, 25-Hydroxy 34.5 30.0 - 100.0 ng/mL  Microalbumin, Urine Waived   Collection Time: 12/22/23 10:57 AM  Result Value Ref Range   Microalb, Ur Waived 30 (H) 0 - 19 mg/L   Creatinine, Urine Waived 100 10 - 300 mg/dL   Microalb/Creat Ratio <30 <30 mg/g      Assessment & Plan:   Problem List  Items Addressed This Visit       Cardiovascular and Mediastinum   HTN (hypertension) - Primary   Missed her blood pressure pill today. Doing well. Continue current regimen. Continue to monitor. Call with any concerns.         Other   Depression   Under good control on current regimen. Continue current regimen. Continue to monitor. Call with any concerns. Refills given.       Relevant Medications   buPROPion (WELLBUTRIN SR) 150 MG 12 hr tablet   Obesity   Congratulated patient on 6lb weight loss! Will continue current regimen and watch side effects- she will call if the taste in her mouth or the tremor gets worse. Call with any concerns. Continue to monitor.         Follow up plan: Return in about 6 weeks (around 03/03/2024).

## 2024-01-21 NOTE — Assessment & Plan Note (Signed)
 Under good control on current regimen. Continue current regimen. Continue to monitor. Call with any concerns. Refills given.

## 2024-01-21 NOTE — Assessment & Plan Note (Signed)
 Congratulated patient on 6lb weight loss! Will continue current regimen and watch side effects- she will call if the taste in her mouth or the tremor gets worse. Call with any concerns. Continue to monitor.

## 2024-01-21 NOTE — Assessment & Plan Note (Signed)
 Missed her blood pressure pill today. Doing well. Continue current regimen. Continue to monitor. Call with any concerns.

## 2024-02-05 ENCOUNTER — Encounter: Payer: Self-pay | Admitting: Family Medicine

## 2024-02-05 ENCOUNTER — Ambulatory Visit: Payer: Self-pay

## 2024-02-05 ENCOUNTER — Ambulatory Visit (INDEPENDENT_AMBULATORY_CARE_PROVIDER_SITE_OTHER): Admitting: Family Medicine

## 2024-02-05 VITALS — BP 129/82 | HR 78 | Temp 97.7°F | Wt 179.0 lb

## 2024-02-05 DIAGNOSIS — J0100 Acute maxillary sinusitis, unspecified: Secondary | ICD-10-CM | POA: Diagnosis not present

## 2024-02-05 MED ORDER — PREDNISONE 50 MG PO TABS: 50.0000 mg | ORAL_TABLET | Freq: Every day | ORAL | 0 refills | Status: DC

## 2024-02-05 MED ORDER — AMOXICILLIN-POT CLAVULANATE 875-125 MG PO TABS: 1.0000 | ORAL_TABLET | Freq: Two times a day (BID) | ORAL | 0 refills | Status: DC

## 2024-02-05 NOTE — Telephone Encounter (Signed)
 I have an opening at 1:20 if she'd like to come today

## 2024-02-05 NOTE — Telephone Encounter (Signed)
 Patient schedule for acute visit 02/08/2024.

## 2024-02-05 NOTE — Telephone Encounter (Signed)
  Chief Complaint: schedule appointment Symptoms: cough, congestion, ear pain, throat swelling Frequency: intermittent X 1.5 weeks  Pertinent Negatives: Patient denies difficulty breathing, fever Disposition: [] ED /[x] Urgent Care (no appt availability in office) / [] Appointment(In office/virtual)/ []  [facility]/ [] Home Care/ [] Refused Recommended Disposition /[] [facility]/ []  Follow-up with PCP Additional Notes:  Has 'virus' cough, chest congestion, fever that started last weekend 01/30/24. She improved some, fever has resolved. For past two days cough is worsening and painful, now productive of thick sputum. Feels congestion and swelling in throat. Late last night she felt she was having trouble breathing, throat felt tight, pressure in ears. No difficulty breathing at this time, speaking in full sentences.  Occasional cough noted during call. She decided not to take wellbutrin  today because she felt is was drying her out. She is gargling with salt water , drinking plenty of fluids, avoiding pollen. Requesting evaluation. No acute visits available today, advised urgent care evaluation. Patient prefers to come to see Dr. Lincoln Renshaw or other provider in office but agreeable to urgent care if no other option. Plans to have her nurse neighbor evaluate her and then bring her to urgent care. Educated on care advice as documented in protocol, patient verbalized understanding. Discussed reasons to call back.     Copied from CRM [id number]. Topic: Clinical - Red Word Triage >> Feb 05, 2024 11:33 AM Crispin Dolphin wrote: Red Word that prompted transfer to Nurse Triage: trouble breathing throat closing, not getting getting as much air as she should. Cough and pressure in ears. Thick Mucus. Stopped wellbutrin  Reason for Disposition  Earache  Protocols used: Cough - Acute Productive-A-AH

## 2024-02-05 NOTE — Progress Notes (Signed)
 BP 129/82 (BP Location: Left Arm, Patient Position: Sitting, Cuff Size: Large)   Pulse 78   Temp 97.7 F (36.5 C) (Oral)   Wt 179 lb (81.2 kg)   SpO2 91%   BMI 29.79 kg/m    Subjective:    Patient ID: Mary Galvan, female    DOB: 1951/08/09, 73 y.o.   MRN: [id number]  HPI: Mary Galvan is a 73 y.o. female  Chief Complaint  Patient presents with   Cough    Onset about a week ago.    Nasal Congestion   Fatigue   UPPER RESPIRATORY TRACT INFECTION Duration: about 7-10 days Worst symptom: congestion, cough Fever: no Cough: yes Shortness of breath: no Wheezing: yes Chest pain: no Chest tightness: yes Chest congestion: no Nasal congestion: yes Runny nose: yes Post nasal drip: yes Sneezing: no Sore throat: yes Swollen glands: no Sinus pressure: yes Headache: yes Face pain: yes Toothache: no Ear pain: yes bilateral Ear pressure: yes bilateral Eyes red/itching:no Eye drainage/crusting: no  Vomiting: no Rash: no Fatigue: yes Sick contacts: no Strep contacts: no  Context: worse Recurrent sinusitis: no Relief with OTC cold/cough medications: no  Treatments attempted: steam, nasal rinse   Relevant past medical, surgical, family and social history reviewed and updated as indicated. Interim medical history since our last visit reviewed. Allergies and medications reviewed and updated.  Review of Systems  Constitutional:  Positive for fatigue. Negative for activity change, appetite change, chills, diaphoresis, fever and unexpected weight change.  HENT:  Positive for congestion, postnasal drip, rhinorrhea, sinus pressure, sinus pain, sore throat and voice change. Negative for dental problem, drooling, ear discharge, ear pain, facial swelling, hearing loss, mouth sores, nosebleeds, sneezing, tinnitus and trouble swallowing.   Eyes: Negative.   Respiratory:  Positive for cough and wheezing. Negative for apnea, choking, chest tightness, shortness of breath and  stridor.   Cardiovascular: Negative.   Musculoskeletal: Negative.   Neurological: Negative.   Psychiatric/Behavioral: Negative.      Per HPI unless specifically indicated above     Objective:    BP 129/82 (BP Location: Left Arm, Patient Position: Sitting, Cuff Size: Large)   Pulse 78   Temp 97.7 F (36.5 C) (Oral)   Wt 179 lb (81.2 kg)   SpO2 91%   BMI 29.79 kg/m   Wt Readings from Last 3 Encounters:  02/05/24 179 lb (81.2 kg)  01/21/24 184 lb 3.2 oz (83.6 kg)  12/24/23 186 lb (84.4 kg)    Physical Exam Vitals and nursing note reviewed.  Constitutional:      General: She is not in acute distress.    Appearance: Normal appearance. She is not ill-appearing, toxic-appearing or diaphoretic.  HENT:     Head: Normocephalic and atraumatic.     Right Ear: Tympanic membrane, ear canal and external ear normal. There is no impacted cerumen.     Left Ear: Tympanic membrane, ear canal and external ear normal. There is no impacted cerumen.     Nose: Rhinorrhea present. No congestion.     Mouth/Throat:     Mouth: Mucous membranes are moist.     Pharynx: Oropharynx is clear. Posterior oropharyngeal erythema present. No oropharyngeal exudate.  Eyes:     General: No scleral icterus.       Right eye: No discharge.        Left eye: No discharge.     Extraocular Movements: Extraocular movements intact.     Conjunctiva/sclera: Conjunctivae normal.     Pupils:  Pupils are equal, round, and reactive to light.  Cardiovascular:     Rate and Rhythm: Normal rate and regular rhythm.     Pulses: Normal pulses.     Heart sounds: Normal heart sounds. No murmur heard.    No friction rub. No gallop.  Pulmonary:     Effort: Pulmonary effort is normal. No respiratory distress.     Breath sounds: No stridor. Wheezing present. No rhonchi or rales.  Chest:     Chest wall: No tenderness.  Musculoskeletal:        General: Normal range of motion.     Cervical back: Normal range of motion and neck  supple.  Skin:    General: Skin is warm and dry.     Capillary Refill: Capillary refill takes less than 2 seconds.     Coloration: Skin is not jaundiced or pale.     Findings: No bruising, erythema, lesion or rash.  Neurological:     General: No focal deficit present.     Mental Status: She is alert and oriented to person, place, and time. Mental status is at baseline.  Psychiatric:        Mood and Affect: Mood normal.        Behavior: Behavior normal.        Thought Content: Thought content normal.        Judgment: Judgment normal.     Results for orders placed or performed in visit on 12/22/23  CBC with Differential/Platelet   Collection Time: 12/22/23 10:55 AM  Result Value Ref Range   WBC 6.1 3.4 - 10.8 x10E3/uL   RBC 4.99 3.77 - 5.28 x10E6/uL   Hemoglobin 15.6 11.1 - 15.9 g/dL   Hematocrit 96.0 (H) 45.4 - 46.6 %   MCV 95 79 - 97 fL   MCH 31.3 26.6 - 33.0 pg   MCHC 33.1 31.5 - 35.7 g/dL   RDW 09.8 11.9 - 14.7 %   Platelets 230 150 - 450 x10E3/uL   Neutrophils 59 Not Estab. %   Lymphs 31 Not Estab. %   Monocytes 8 Not Estab. %   Eos 1 Not Estab. %   Basos 1 Not Estab. %   Neutrophils Absolute 3.6 1.4 - 7.0 x10E3/uL   Lymphocytes Absolute 1.9 0.7 - 3.1 x10E3/uL   Monocytes Absolute 0.5 0.1 - 0.9 x10E3/uL   EOS (ABSOLUTE) 0.1 0.0 - 0.4 x10E3/uL   Basophils Absolute 0.0 0.0 - 0.2 x10E3/uL   Immature Granulocytes 0 Not Estab. %   Immature Grans (Abs) 0.0 0.0 - 0.1 x10E3/uL  Comprehensive metabolic panel   Collection Time: 12/22/23 10:55 AM  Result Value Ref Range   Glucose 104 (H) 70 - 99 mg/dL   BUN 18 8 - 27 mg/dL   Creatinine, Ser 8.29 0.57 - 1.00 mg/dL   eGFR 93 >56 OZ/HYQ/6.57   BUN/Creatinine Ratio 28 12 - 28   Sodium 139 134 - 144 mmol/L   Potassium 4.1 3.5 - 5.2 mmol/L   Chloride 100 96 - 106 mmol/L   CO2 28 20 - 29 mmol/L   Calcium 9.6 8.7 - 10.3 mg/dL   Total Protein 6.8 6.0 - 8.5 g/dL   Albumin 4.3 3.8 - 4.8 g/dL   Globulin, Total 2.5 1.5 - 4.5 g/dL    Bilirubin Total 0.4 0.0 - 1.2 mg/dL   Alkaline Phosphatase 84 44 - 121 IU/L   AST 15 0 - 40 IU/L   ALT 17 0 - 32 IU/L  Lipid Panel  w/o Chol/HDL Ratio   Collection Time: 12/22/23 10:55 AM  Result Value Ref Range   Cholesterol, Total 255 (H) 100 - 199 mg/dL   Triglycerides 161 (H) 0 - 149 mg/dL   HDL 49 >09 mg/dL   VLDL Cholesterol Cal 46 (H) 5 - 40 mg/dL   LDL Chol Calc (NIH) 604 (H) 0 - 99 mg/dL  TSH   Collection Time: 12/22/23 10:55 AM  Result Value Ref Range   TSH 1.690 0.450 - 4.500 uIU/mL  VITAMIN D  25 Hydroxy (Vit-D Deficiency, Fractures)   Collection Time: 12/22/23 10:55 AM  Result Value Ref Range   Vit D, 25-Hydroxy 34.5 30.0 - 100.0 ng/mL  Microalbumin, Urine Waived   Collection Time: 12/22/23 10:57 AM  Result Value Ref Range   Microalb, Ur Waived 30 (H) 0 - 19 mg/L   Creatinine, Urine Waived 100 10 - 300 mg/dL   Microalb/Creat Ratio <30 <30 mg/g      Assessment & Plan:   Problem List Items Addressed This Visit   None Visit Diagnoses       Acute non-recurrent maxillary sinusitis    -  Primary   Will treat with augmentin and steroids. Call with any concerns or if not getting better. Continue to monitor.   Relevant Medications   amoxicillin-clavulanate (AUGMENTIN) 875-125 MG tablet   predniSONE (DELTASONE) 50 MG tablet        Follow up plan: Return if symptoms worsen or fail to improve.

## 2024-02-05 NOTE — Telephone Encounter (Signed)
 Patient is confirmed for 1:20 pm today and will head to the office now.

## 2024-02-08 ENCOUNTER — Ambulatory Visit: Admitting: Family Medicine

## 2024-02-12 ENCOUNTER — Ambulatory Visit: Payer: Self-pay | Admitting: *Deleted

## 2024-02-12 DIAGNOSIS — R0602 Shortness of breath: Secondary | ICD-10-CM

## 2024-02-12 NOTE — Addendum Note (Signed)
 Addended by: Solomon Dupre on: 02/12/2024 11:37 AM   Modules accepted: Orders

## 2024-02-12 NOTE — Telephone Encounter (Signed)
 Order in for x-ray. Please get her scheduled for sick visit for early next week if not better.

## 2024-02-12 NOTE — Telephone Encounter (Signed)
 Reviewed with patient and scheduled her for office visit next week. She will discuss with her husband about the xray's before making a final decision.

## 2024-02-12 NOTE — Telephone Encounter (Signed)
 Copied from CRM [id number]. Topic: Clinical - Red Word Triage >> Feb 12, 2024 10:25 AM Donald Frost wrote: Red Word that prompted transfer to Nurse Triage: The patient called stating she has felt sick for a few weeks. She has finished her prednisone  and only has 4 days left of her amoxicillin -clavulanate (AUGMENTIN ) 875-125 MG tablet. She still complains of congestion, some choking and shortness of breath. I will transfer her to E2C2 NT to be further evaluated. Reason for Disposition  [1] MILD difficulty breathing (e.g., minimal/no SOB at rest, SOB with walking, pulse <100) AND [2] NEW-onset or WORSE than normal  Answer Assessment - Initial Assessment Questions 1. RESPIRATORY STATUS: "Describe your breathing?" (e.g., wheezing, shortness of breath, unable to speak, severe coughing)      I feel like I'm not getting enough oxygen.   With this cold I've had I've had a lot of chest congestion initially.   I was coughing and congestion in my chest.   I've felt all along I have congestion in my throat and I can't get that cleared out.   Coughing has been severe for 4-5 days and productive.   I'm still having that feeling that I had when seen last week.   I have a clog in my throat.   I'm concerned that this morning I was feeling very dizzy.   This is new.   2. ONSET: "When did this breathing problem begin?"      This morning very dizzy.   I was in on Fri and my oxygen was 91 %.   I've taken all the prednisone  Dr. Lincoln Renshaw prescribed.   I started it Sat.   I started the Amoxicillin  on Fri.   She felt it was a sinus infection that was draining down the back of my throat.    I did get better but I've been sick for 2 weeks and I was expecting to be feeling better by now.   Do I need to be seen again?   My husband was seen yesterday for the same thing by Jolene and was given a Z Pak and Claritin.    Should I take Claritin? 3. PATTERN "Does the difficult breathing come and go, or has it been constant since it started?"       I'm still feeling like I'm not getting enough oxygen.   I'm not getting anything up.   I never did the whole time.    4. SEVERITY: "How bad is your breathing?" (e.g., mild, moderate, severe)    - MILD: No SOB at rest, mild SOB with walking, speaks normally in sentences, can lie down, no retractions, pulse < 100.    - MODERATE: SOB at rest, SOB with minimal exertion and prefers to sit, cannot lie down flat, speaks in phrases, mild retractions, audible wheezing, pulse 100-120.    - SEVERE: Very SOB at rest, speaks in single words, struggling to breathe, sitting hunched forward, retractions, pulse > 120      Moderate.     I feel like it's just there in my throat from the sinus congestion.    I have not had a runny or stopped up nose.   It's all been in my chest and my throat.   I wish I could clear my throat or cough and get this out.     I had fever for 3 days initially but not now. 5. RECURRENT SYMPTOM: "Have you had difficulty breathing before?" If Yes, ask: "When was the last  time?" and "What happened that time?"      Not asked 6. CARDIAC HISTORY: "Do you have any history of heart disease?" (e.g., heart attack, angina, bypass surgery, angioplasty)      Not asked 7. LUNG HISTORY: "Do you have any history of lung disease?"  (e.g., pulmonary embolus, asthma, emphysema)     No 8. CAUSE: "What do you think is causing the breathing problem?"      I can't get this stuff out of my throat.       I stopped smoking 25 yrs ago.  9. OTHER SYMPTOMS: "Do you have any other symptoms? (e.g., dizziness, runny nose, cough, chest pain, fever)     Dizziness this morning 10. O2 SATURATION MONITOR:  "Do you use an oxygen saturation monitor (pulse oximeter) at home?" If Yes, ask: "What is your reading (oxygen level) today?" "What is your usual oxygen saturation reading?" (e.g., 95%)       It was 91% in the office 11. PREGNANCY: "Is there any chance you are pregnant?" "When was your last menstrual period?"       N/A 12.  TRAVEL: "Have you traveled out of the country in the last month?" (e.g., travel history, exposures)       N/A  Protocols used: Breathing Difficulty-A-AH  Chief Complaint: Not feeling any better.  I woke up feeling dizzy this morning.   Still having congestion in throat.   I still have 4 more days of the Amoxicillin .    Symptoms: I just want to get this congestion out of my throat.   I'm not coughing up anything.  Just clearing my throat a lot and feeling like I'm not getting enough oxygen. Frequency: This morning the dizziness is new.   I'm not feeling any better either. Pertinent Negatives: Patient denies coughing up anything, no fever, no runny nose or stopped up nose but a lot of congestion in the back of her throat. Disposition: [] ED /[] Urgent Care (no appt availability in office) / [] Appointment(In office/virtual)/ []   Virtual Care/ [] Home Care/ [] Refused Recommended Disposition /[]  Mobile Bus/ [x]  Follow-up with PCP Additional Notes: Pt seen last Friday by Dr. Lincoln Renshaw but not feeling better.  Now with dizziness and still feeling like she is not getting enough oxygen.   Does she want to call in something different?   SHould I try Claritin like my husband was put on?

## 2024-03-10 ENCOUNTER — Ambulatory Visit: Payer: Medicare Other | Admitting: Dermatology

## 2024-03-11 ENCOUNTER — Encounter: Payer: Self-pay | Admitting: Family Medicine

## 2024-03-11 ENCOUNTER — Ambulatory Visit (INDEPENDENT_AMBULATORY_CARE_PROVIDER_SITE_OTHER): Admitting: Family Medicine

## 2024-03-11 VITALS — BP 107/72 | HR 74 | Ht 65.0 in | Wt 184.8 lb

## 2024-03-11 DIAGNOSIS — Z6830 Body mass index (BMI) 30.0-30.9, adult: Secondary | ICD-10-CM

## 2024-03-11 DIAGNOSIS — E6609 Other obesity due to excess calories: Secondary | ICD-10-CM | POA: Diagnosis not present

## 2024-03-11 DIAGNOSIS — E66811 Obesity, class 1: Secondary | ICD-10-CM | POA: Diagnosis not present

## 2024-03-11 DIAGNOSIS — F3341 Major depressive disorder, recurrent, in partial remission: Secondary | ICD-10-CM

## 2024-03-11 MED ORDER — TIRZEPATIDE-WEIGHT MANAGEMENT 2.5 MG/0.5ML ~~LOC~~ SOLN: 2.5000 mg | SUBCUTANEOUS | 1 refills | Status: DC

## 2024-03-11 NOTE — Progress Notes (Signed)
 BP 107/72 (BP Location: Left Arm, Patient Position: Sitting, Cuff Size: Large)   Pulse 74   Ht 5\' 5"  (1.651 m)   Wt 184 lb 12.8 oz (83.8 kg)   SpO2 98%   BMI 30.75 kg/m    Subjective:    Patient ID: Mary Galvan, female    DOB: Feb 01, 1951, 73 y.o.   MRN: [id number]  HPI: Mary Galvan is a 73 y.o. female  Chief Complaint  Patient presents with   Medication Problem    Pt stated that she had an adverse reaction of the increased Wellbutrin . It caused her to have dry mouth and worsened her depression.    Obesity    Pt would like to discuss going on Zepbound    OBESITY- came off her wellbutrin  because she was afraid it was making her SOB. She as checking her oxygen with a pulse ox and it was normal. She notes that she was feeling really down when she came off the wellbutrin . She notes that she's not coughing anymore and not feeling SOB. Has still been feeling a bit down, but has been feeling a bit better with getting together with friends. Duration: chronic Previous attempts at weight loss: yes Complications of obesity: HTN, HLD, IFG, fatty liver disease Weight loss goal: 140s Weight loss to date: 2 lbs Requesting obesity pharmacotherapy: no Current weight loss supplements/medications: no Previous weight loss supplements/meds: yes- wellbutrin   ANXIETY/STRESS Duration: chronic Status:slightly worse Anxious mood: yes  Excessive worrying: yes Irritability: no  Sweating: no Nausea: no Palpitations:no Hyperventilation: no Panic attacks: no Agoraphobia: no  Obscessions/compulsions: no Depressed mood: no    01/21/2024    3:35 PM 12/24/2023    3:27 PM 12/22/2023   10:46 AM 10/28/2023   11:26 AM 12/16/2022   10:21 AM  Depression screen PHQ 2/9  Decreased Interest 0 1 0 0 1  Down, Depressed, Hopeless 0 1 0 0 1  PHQ - 2 Score 0 2 0 0 2  Altered sleeping 0 0 0 0 0  Tired, decreased energy 0 0 1 1 1   Change in appetite 0 0 0 1 1  Feeling bad or failure about yourself  0  0 0 0 1  Trouble concentrating 0 0 0 0 0  Moving slowly or fidgety/restless 0 0 0 0 0  Suicidal thoughts 0 0 0 0 0  PHQ-9 Score 0 2 1 2 5   Difficult doing work/chores  Not difficult at all  Somewhat difficult       01/21/2024    3:35 PM 12/22/2023   10:46 AM 10/28/2023   11:26 AM 12/16/2022   10:25 AM  GAD 7 : Generalized Anxiety Score  Nervous, Anxious, on Edge 0 0 0 0  Control/stop worrying 0 0 0 0  Worry too much - different things 0 0 0 0  Trouble relaxing 0 0 0 0  Restless 0 0 0 0  Easily annoyed or irritable 0 0 0 0  Afraid - awful might happen 0 0 0 0  Total GAD 7 Score 0 0 0 0  Anxiety Difficulty   Not difficult at all Not difficult at all   Anhedonia: no Weight changes: no Insomnia: no   Hypersomnia: no Fatigue/loss of energy: no Feelings of worthlessness: no Feelings of guilt: no Impaired concentration/indecisiveness: no Suicidal ideations: no  Crying spells: no Recent Stressors/Life Changes: no   Relationship problems: no   Family stress: no     Financial stress: no    Job  stress: no    Recent death/loss: no    Relevant past medical, surgical, family and social history reviewed and updated as indicated. Interim medical history since our last visit reviewed. Allergies and medications reviewed and updated.  Review of Systems  Constitutional: Negative.   Respiratory: Negative.    Cardiovascular: Negative.   Musculoskeletal: Negative.   Skin: Negative.   Neurological: Negative.   Psychiatric/Behavioral:  Positive for dysphoric mood. Negative for agitation, behavioral problems, confusion, decreased concentration, hallucinations, self-injury, sleep disturbance and suicidal ideas. The patient is nervous/anxious. The patient is not hyperactive.     Per HPI unless specifically indicated above     Objective:     BP 107/72 (BP Location: Left Arm, Patient Position: Sitting, Cuff Size: Large)   Pulse 74   Ht 5\' 5"  (1.651 m)   Wt 184 lb 12.8 oz (83.8 kg)    SpO2 98%   BMI 30.75 kg/m   Wt Readings from Last 3 Encounters:  03/11/24 184 lb 12.8 oz (83.8 kg)  02/05/24 179 lb (81.2 kg)  01/21/24 184 lb 3.2 oz (83.6 kg)    Physical Exam Vitals and nursing note reviewed.  Constitutional:      General: She is not in acute distress.    Appearance: Normal appearance. She is obese. She is not ill-appearing, toxic-appearing or diaphoretic.  HENT:     Head: Normocephalic and atraumatic.     Right Ear: External ear normal.     Left Ear: External ear normal.     Nose: Nose normal.     Mouth/Throat:     Mouth: Mucous membranes are moist.     Pharynx: Oropharynx is clear.  Eyes:     General: No scleral icterus.       Right eye: No discharge.        Left eye: No discharge.     Extraocular Movements: Extraocular movements intact.     Conjunctiva/sclera: Conjunctivae normal.     Pupils: Pupils are equal, round, and reactive to light.  Cardiovascular:     Rate and Rhythm: Normal rate and regular rhythm.     Pulses: Normal pulses.     Heart sounds: Normal heart sounds. No murmur heard.    No friction rub. No gallop.  Pulmonary:     Effort: Pulmonary effort is normal. No respiratory distress.     Breath sounds: Normal breath sounds. No stridor. No wheezing, rhonchi or rales.  Chest:     Chest wall: No tenderness.  Musculoskeletal:        General: Normal range of motion.     Cervical back: Normal range of motion and neck supple.  Skin:    General: Skin is warm and dry.     Capillary Refill: Capillary refill takes less than 2 seconds.     Coloration: Skin is not jaundiced or pale.     Findings: No bruising, erythema, lesion or rash.  Neurological:     General: No focal deficit present.     Mental Status: She is alert and oriented to person, place, and time. Mental status is at baseline.  Psychiatric:        Mood and Affect: Mood normal.        Behavior: Behavior normal.        Thought Content: Thought content normal.        Judgment:  Judgment normal.     Results for orders placed or performed in visit on 12/22/23  CBC with Differential/Platelet   Collection  Time: 12/22/23 10:55 AM  Result Value Ref Range   WBC 6.1 3.4 - 10.8 x10E3/uL   RBC 4.99 3.77 - 5.28 x10E6/uL   Hemoglobin 15.6 11.1 - 15.9 g/dL   Hematocrit 16.1 (H) 09.6 - 46.6 %   MCV 95 79 - 97 fL   MCH 31.3 26.6 - 33.0 pg   MCHC 33.1 31.5 - 35.7 g/dL   RDW 04.5 40.9 - 81.1 %   Platelets 230 150 - 450 x10E3/uL   Neutrophils 59 Not Estab. %   Lymphs 31 Not Estab. %   Monocytes 8 Not Estab. %   Eos 1 Not Estab. %   Basos 1 Not Estab. %   Neutrophils Absolute 3.6 1.4 - 7.0 x10E3/uL   Lymphocytes Absolute 1.9 0.7 - 3.1 x10E3/uL   Monocytes Absolute 0.5 0.1 - 0.9 x10E3/uL   EOS (ABSOLUTE) 0.1 0.0 - 0.4 x10E3/uL   Basophils Absolute 0.0 0.0 - 0.2 x10E3/uL   Immature Granulocytes 0 Not Estab. %   Immature Grans (Abs) 0.0 0.0 - 0.1 x10E3/uL  Comprehensive metabolic panel   Collection Time: 12/22/23 10:55 AM  Result Value Ref Range   Glucose 104 (H) 70 - 99 mg/dL   BUN 18 8 - 27 mg/dL   Creatinine, Ser 9.14 0.57 - 1.00 mg/dL   eGFR 93 >78 GN/FAO/1.30   BUN/Creatinine Ratio 28 12 - 28   Sodium 139 134 - 144 mmol/L   Potassium 4.1 3.5 - 5.2 mmol/L   Chloride 100 96 - 106 mmol/L   CO2 28 20 - 29 mmol/L   Calcium 9.6 8.7 - 10.3 mg/dL   Total Protein 6.8 6.0 - 8.5 g/dL   Albumin 4.3 3.8 - 4.8 g/dL   Globulin, Total 2.5 1.5 - 4.5 g/dL   Bilirubin Total 0.4 0.0 - 1.2 mg/dL   Alkaline Phosphatase 84 44 - 121 IU/L   AST 15 0 - 40 IU/L   ALT 17 0 - 32 IU/L  Lipid Panel w/o Chol/HDL Ratio   Collection Time: 12/22/23 10:55 AM  Result Value Ref Range   Cholesterol, Total 255 (H) 100 - 199 mg/dL   Triglycerides 865 (H) 0 - 149 mg/dL   HDL 49 >78 mg/dL   VLDL Cholesterol Cal 46 (H) 5 - 40 mg/dL   LDL Chol Calc (NIH) 469 (H) 0 - 99 mg/dL  TSH   Collection Time: 12/22/23 10:55 AM  Result Value Ref Range   TSH 1.690 0.450 - 4.500 uIU/mL  VITAMIN D  25  Hydroxy (Vit-D Deficiency, Fractures)   Collection Time: 12/22/23 10:55 AM  Result Value Ref Range   Vit D, 25-Hydroxy 34.5 30.0 - 100.0 ng/mL  Microalbumin, Urine Waived   Collection Time: 12/22/23 10:57 AM  Result Value Ref Range   Microalb, Ur Waived 30 (H) 0 - 19 mg/L   Creatinine, Urine Waived 100 10 - 300 mg/dL   Microalb/Creat Ratio <30 <30 mg/g      Assessment & Plan:   Problem List Items Addressed This Visit       Other   Depression   Doing OK. Mood slightly worse after stopping her wellbutrin , but hanging in there. Will continue to monitor. Call with any concerns.       Obesity - Primary   Would like to start zepbound  through the company. Rx sent to [facility] today. Follow up 6 weeks. Call with any concerns.       Relevant Medications   tirzepatide  (ZEPBOUND ) 2.5 MG/0.5ML injection vial  Follow up plan: Return in about 6 weeks (around 04/25/2024).

## 2024-03-14 NOTE — Assessment & Plan Note (Signed)
 Doing OK. Mood slightly worse after stopping her wellbutrin , but hanging in there. Will continue to monitor. Call with any concerns.

## 2024-03-14 NOTE — Assessment & Plan Note (Signed)
 Would like to start zepbound  through the company. Rx sent to [facility] today. Follow up 6 weeks. Call with any concerns.

## 2024-04-11 ENCOUNTER — Encounter: Payer: Self-pay | Admitting: Dermatology

## 2024-04-11 ENCOUNTER — Ambulatory Visit: Admitting: Dermatology

## 2024-04-11 DIAGNOSIS — L820 Inflamed seborrheic keratosis: Secondary | ICD-10-CM

## 2024-04-11 DIAGNOSIS — Z85828 Personal history of other malignant neoplasm of skin: Secondary | ICD-10-CM

## 2024-04-11 DIAGNOSIS — L570 Actinic keratosis: Secondary | ICD-10-CM | POA: Diagnosis not present

## 2024-04-11 DIAGNOSIS — Z79899 Other long term (current) drug therapy: Secondary | ICD-10-CM

## 2024-04-11 DIAGNOSIS — Z1283 Encounter for screening for malignant neoplasm of skin: Secondary | ICD-10-CM | POA: Diagnosis not present

## 2024-04-11 DIAGNOSIS — W908XXA Exposure to other nonionizing radiation, initial encounter: Secondary | ICD-10-CM

## 2024-04-11 DIAGNOSIS — L719 Rosacea, unspecified: Secondary | ICD-10-CM

## 2024-04-11 DIAGNOSIS — L578 Other skin changes due to chronic exposure to nonionizing radiation: Secondary | ICD-10-CM

## 2024-04-11 DIAGNOSIS — L304 Erythema intertrigo: Secondary | ICD-10-CM | POA: Diagnosis not present

## 2024-04-11 DIAGNOSIS — I8393 Asymptomatic varicose veins of bilateral lower extremities: Secondary | ICD-10-CM

## 2024-04-11 DIAGNOSIS — L814 Other melanin hyperpigmentation: Secondary | ICD-10-CM

## 2024-04-11 DIAGNOSIS — I781 Nevus, non-neoplastic: Secondary | ICD-10-CM | POA: Diagnosis not present

## 2024-04-11 DIAGNOSIS — D1801 Hemangioma of skin and subcutaneous tissue: Secondary | ICD-10-CM | POA: Diagnosis not present

## 2024-04-11 DIAGNOSIS — Z86018 Personal history of other benign neoplasm: Secondary | ICD-10-CM

## 2024-04-11 DIAGNOSIS — Z7189 Other specified counseling: Secondary | ICD-10-CM

## 2024-04-11 DIAGNOSIS — L821 Other seborrheic keratosis: Secondary | ICD-10-CM | POA: Diagnosis not present

## 2024-04-11 DIAGNOSIS — D229 Melanocytic nevi, unspecified: Secondary | ICD-10-CM

## 2024-04-11 NOTE — Patient Instructions (Addendum)
 If rash treated under breast after 4 weeks still persist please call or send mychart will prescribe treatment for rash  For eyelids   Recommend to contact previous eye surgeon Greig Gay to evaluate further     Seborrheic Keratosis  What causes seborrheic keratoses? Seborrheic keratoses are harmless, common skin growths that first appear during adult life.  As time goes by, more growths appear.  Some people may develop a large number of them.  Seborrheic keratoses appear on both covered and uncovered body parts.  They are not caused by sunlight.  The tendency to develop seborrheic keratoses can be inherited.  They vary in color from skin-colored to gray, brown, or even black.  They can be either smooth or have a rough, warty surface.   Seborrheic keratoses are superficial and look as if they were stuck on the skin.  Under the microscope this type of keratosis looks like layers upon layers of skin.  That is why at times the top layer may seem to fall off, but the rest of the growth remains and re-grows.    Treatment Seborrheic keratoses do not need to be treated, but can easily be removed in the office.  Seborrheic keratoses often cause symptoms when they rub on clothing or jewelry.  Lesions can be in the way of shaving.  If they become inflamed, they can cause itching, soreness, or burning.  Removal of a seborrheic keratosis can be accomplished by freezing, burning, or surgery. If any spot bleeds, scabs, or grows rapidly, please return to have it checked, as these can be an indication of a skin cancer.  Cryotherapy Aftercare  Wash gently with soap and water  everyday.   Apply Vaseline and Band-Aid daily until healed.   Actinic keratoses are precancerous spots that appear secondary to cumulative UV radiation exposure/sun exposure over time. They are chronic with expected duration over 1 year. A portion of actinic keratoses will progress to squamous cell carcinoma of the skin. It is not  possible to reliably predict which spots will progress to skin cancer and so treatment is recommended to prevent development of skin cancer.  Recommend daily broad spectrum sunscreen SPF 30+ to sun-exposed areas, reapply every 2 hours as needed.  Recommend staying in the shade or wearing long sleeves, sun glasses (UVA+UVB protection) and wide brim hats (4-inch brim around the entire circumference of the hat). Call for new or changing lesions.     Melanoma ABCDEs  Melanoma is the most dangerous type of skin cancer, and is the leading cause of death from skin disease.  You are more likely to develop melanoma if you: Have light-colored skin, light-colored eyes, or red or blond hair Spend a lot of time in the sun Tan regularly, either outdoors or in a tanning bed Have had blistering sunburns, especially during childhood Have a close family member who has had a melanoma Have atypical moles or large birthmarks  Early detection of melanoma is key since treatment is typically straightforward and cure rates are extremely high if we catch it early.   The first sign of melanoma is often a change in a mole or a new dark spot.  The ABCDE system is a way of remembering the signs of melanoma.  A for asymmetry:  The two halves do not match. B for border:  The edges of the growth are irregular. C for color:  A mixture of colors are present instead of an even brown color. D for diameter:  Melanomas are usually (  but not always) greater than 6mm - the size of a pencil eraser. E for evolution:  The spot keeps changing in size, shape, and color.  Please check your skin once per month between visits. You can use a small mirror in front and a large mirror behind you to keep an eye on the back side or your body.   If you see any new or changing lesions before your next follow-up, please call to schedule a visit.  Please continue daily skin protection including broad spectrum sunscreen SPF 30+ to sun-exposed  areas, reapplying every 2 hours as needed when you're outdoors.   Staying in the shade or wearing long sleeves, sun glasses (UVA+UVB protection) and wide brim hats (4-inch brim around the entire circumference of the hat) are also recommended for sun protection.    Due to recent changes in healthcare laws, you may see results of your pathology and/or laboratory studies on MyChart before the doctors have had a chance to review them. We understand that in some cases there may be results that are confusing or concerning to you. Please understand that not all results are received at the same time and often the doctors may need to interpret multiple results in order to provide you with the best plan of care or course of treatment. Therefore, we ask that you please give us  2 business days to thoroughly review all your results before contacting the office for clarification. Should we see a critical lab result, you will be contacted sooner.   If You Need Anything After Your Visit  If you have any questions or concerns for your doctor, please call our main line at [phone] and press option 4 to reach your doctor's medical assistant. If no one answers, please leave a voicemail as directed and we will return your call as soon as possible. Messages left after 4 pm will be answered the following business day.   You may also send us  a message via MyChart. We typically respond to MyChart messages within 1-2 business days.  For prescription refills, please ask your pharmacy to contact our office. Our fax number is [phone].  If you have an urgent issue when the clinic is closed that cannot wait until the next business day, you can page your doctor at the number below.    Please note that while we do our best to be available for urgent issues outside of office hours, we are not available 24/7.   If you have an urgent issue and are unable to reach us , you may choose to seek medical care at your doctor's  office, retail clinic, urgent care center, or emergency room.  If you have a medical emergency, please immediately call 911 or go to the emergency department.  Pager Numbers  - Dr. Hester: [phone]  - Dr. Jackquline: [phone]  - Dr. Claudene: [phone]   In the event of inclement weather, please call our main line at [phone] for an update on the status of any delays or closures.  Dermatology Medication Tips: Please keep the boxes that topical medications come in in order to help keep track of the instructions about where and how to use these. Pharmacies typically print the medication instructions only on the boxes and not directly on the medication tubes.   If your medication is too expensive, please contact our office at [phone] option 4 or send us  a message through MyChart.   We are unable to tell what your co-pay for medications will be  in advance as this is different depending on your insurance coverage. However, we may be able to find a substitute medication at lower cost or fill out paperwork to get insurance to cover a needed medication.   If a prior authorization is required to get your medication covered by your insurance company, please allow us  1-2 business days to complete this process.  Drug prices often vary depending on where the prescription is filled and some pharmacies may offer cheaper prices.  The website www.goodrx.com contains coupons for medications through different pharmacies. The prices here do not account for what the cost may be with help from insurance (it may be cheaper with your insurance), but the website can give you the price if you did not use any insurance.  - You can print the associated coupon and take it with your prescription to the pharmacy.  - You may also stop by our office during regular business hours and pick up a GoodRx coupon card.  - If you need your prescription sent electronically to a different pharmacy, notify our  office through [facility] or by phone at [phone] option 4.     Si Usted Necesita Algo Despus de Su Visita  Tambin puede enviarnos un mensaje a travs de Clinical cytogeneticist. Por lo general respondemos a los mensajes de MyChart en el transcurso de 1 a 2 das hbiles.  Para renovar recetas, por favor pida a su farmacia que se ponga en contacto con nuestra oficina. Randi lakes de fax es [place] [phone].  Si tiene un asunto urgente cuando la clnica est cerrada y que no puede esperar hasta el siguiente da hbil, puede llamar/localizar a su doctor(a) al nmero que aparece a continuacin.   Por favor, tenga en cuenta que aunque hacemos todo lo posible para estar disponibles para asuntos urgentes fuera del horario de [place], no estamos disponibles las 24 horas del da, los 7 [address]  [address] de la [place].   Si tiene un problema urgente y no puede comunicarse con nosotros, puede optar por buscar atencin mdica  en el consultorio de su doctor(a), en una clnica privada, en un centro de atencin urgente o en una sala de emergencias.  Si tiene Engineer, drilling, por favor llame inmediatamente al 911 o vaya a la sala de emergencias.  Nmeros de bper  - Dr. Hester: [phone]  - Dra. Jackquline: [phone]  - Dr. Claudene: [phone]   En caso de inclemencias del tiempo, por favor llame a landry capes principal al [phone] para una actualizacin sobre el [place] de cualquier retraso o cierre.  Consejos para la medicacin en dermatologa: Por favor, guarde las cajas en las que vienen los medicamentos de uso tpico para ayudarle a seguir las instrucciones sobre dnde y cmo usarlos. Las farmacias generalmente imprimen las instrucciones del medicamento slo en las cajas y no directamente en los tubos del [place].   Si su medicamento es muy caro, por favor, pngase en contacto con landry rieger llamando al [phone] y presione la opcin 4 o envenos un mensaje a travs de Clinical cytogeneticist.    No podemos decirle cul ser su copago por los medicamentos por adelantado ya que esto es diferente dependiendo de la cobertura de su seguro. Sin embargo, es posible que podamos encontrar un medicamento sustituto a Audiological scientist un formulario para que el seguro cubra el medicamento que se considera necesario.   Si se requiere una autorizacin previa para que su compaa de seguros malta su medicamento, por favor permtanos de 1  a 2 das hbiles para completar este proceso.  Los precios de los medicamentos varan con frecuencia dependiendo del Environmental consultant de dnde se surte la receta y alguna farmacias pueden ofrecer precios ms baratos.  El sitio web www.goodrx.com tiene cupones para medicamentos de Health and safety inspector. Los precios aqu no tienen en cuenta lo que podra costar con la ayuda del seguro (puede ser ms barato con su seguro), pero el sitio web puede darle el precio si no utiliz Tourist information centre manager.  - Puede imprimir el cupn correspondiente y llevarlo con su receta a la farmacia.  - Tambin puede pasar por nuestra oficina durante el horario de atencin regular y Education officer, museum una tarjeta de cupones de GoodRx.  - Si necesita que su receta se enve electrnicamente a una farmacia diferente, informe a nuestra oficina a travs de MyChart de [place] o por telfono llamando al [phone] y presione la opcin 4.

## 2024-04-11 NOTE — Progress Notes (Signed)
 Follow-Up Visit   Subjective  Mary Galvan is a 73 y.o. female who presents for the following: Skin Cancer Screening and Full Body Skin Exam hx of rosacea, hx of aks and isks, hx of dysplastic nevi,   A few rough spot on left side of face, noticed in last couple of days, some lumpy areas on b/l eyelids. Reports had  Blepharoplasty 2 years ago and suffered an allergic reaction to surtures and had hard time healing. 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 The patient has spots, moles and lesions to be evaluated, some may be new or changing and the patient may have concern these could be cancer.  The following portions of the chart were reviewed this encounter and updated as appropriate: medications, allergies, medical history  Review of Systems:  No other skin or systemic complaints except as noted in HPI or Assessment and Plan.  Objective  Well appearing patient in no apparent distress; mood and affect are within normal limits.  A full examination was performed including scalp, head, eyes, ears, nose, lips, neck, chest, axillae, abdomen, back, buttocks, bilateral upper extremities, bilateral lower extremities, hands, feet, fingers, toes, fingernails, and toenails. All findings within normal limits unless otherwise noted below.   Relevant physical exam findings are noted in the Assessment and Plan.  face x 5 (5) Erythematous thin papules/macules with gritty scale.  face x 4, left inframammary x 10 , right inframammary x 3 (17) Erythematous stuck-on, waxy papule or plaque  Assessment & Plan   SKIN CANCER SCREENING PERFORMED TODAY.  ACTINIC DAMAGE - Chronic condition, secondary to cumulative UV/sun exposure - diffuse scaly erythematous macules with  underlying dyspigmentation - Recommend daily broad spectrum sunscreen SPF 30+ to sun-exposed areas, reapply every 2 hours as needed.  - Staying in the shade or wearing long sleeves, sun glasses (UVA+UVB protection) and wide brim hats (4-inch brim around the entire circumference of the hat) are also recommended for sun protection.  - Call for new or changing lesions.  LENTIGINES, SEBORRHEIC KERATOSES, HEMANGIOMAS - Benign normal skin lesions - Benign-appearing - Call for any changes Sks at face   MELANOCYTIC NEVI - Tan-brown and/or pink-flesh-colored symmetric macules and papules - Benign appearing on exam today - Observation - Call clinic for new or changing moles - Recommend daily use of broad spectrum spf 30+ sunscreen to sun-exposed areas.   ROSACEA Exam Redness at cheeks and chin  Chronic and persistent condition with duration or expected duration over one year. Condition is improving with treatment but not currently at goal. Rosacea is a chronic progressive skin condition usually affecting the face of adults, causing redness and/or acne bumps. It is treatable but not curable. It sometimes affects the eyes (ocular rosacea) as well. It may respond to topical and/or systemic medication and can flare with stress, sun exposure, alcohol, exercise, topical steroids (including hydrocortisone /cortisone 10) and some foods.  Daily application of broad spectrum spf 30+ sunscreen to face is recommended to reduce flares.  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 Condition is symptomatic/ bothersome to patient. Not currently at goal. Intertrigo is a chronic recurrent rash that occurs in skin fold areas that may be  associated with friction; heat; moisture; yeast; fungus; and bacteria.  It is exacerbated by increased movement / activity; sweating; and higher atmospheric temperature.  Use of an absorbant powder such as Zeasorb AF powder or other OTC antifungal powder to the area daily can prevent rash recurrence. Other options to help keep the area dry include blow drying the area after bathing or using antiperspirant products such as Duradry sweat minimizing gel.  Treatment Plan: After healed at spots treated under breast if still bothered by rash patient advised to call or send mychart  Will send in rx of   [facility] Iodoquinol 1%, Hydrocortisone  2.5%, Niacinamide 2% Cream twice a day to affected areas for up to two weeks.  The patient was advised this is not covered by insurance since it is made by a compounding pharmacy. They will receive an email to check out and the medication will be mailed to their home.   Varicose Veins/Spider Veins - Dilated blue, purple or red veins at the lower extremities - Reassured - Smaller vessels can be treated by sclerotherapy (a procedure to inject a medicine into the veins to make them disappear) if desired, but the treatment is not covered by insurance. Larger vessels may be covered if symptomatic and we would refer to vascular surgeon if treatment desired.   Recommend amy fowler to look at b/l upper eyelid  Hx of blethroplasty   HISTORY OF BASAL CELL CARCINOMA OF THE SKIN 10/20/2017 just under left brow - No evidence of recurrence today - Recommend regular full body skin exams - Recommend daily broad spectrum sunscreen SPF 30+ to sun-exposed areas, reapply every 2 hours as needed.  - Call if any new or changing lesions are noted between office visits  HISTORY OF DYSPLASTIC NEVUS 01/19/2018 right distal dorsum foot  No evidence of recurrence today Recommend regular full body skin exams Recommend daily broad spectrum sunscreen SPF 30+ to sun-exposed areas, reapply  every 2 hours as needed.  Call if any new or changing lesions are noted between office visits  HISTORY OF PRECANCEROUS ACTINIC KERATOSIS Left proximal lateral pretibial ED&C  03/11/2023 - site(s) of PreCancerous Actinic Keratosis clear today. - these may recur and new lesions may form requiring treatment to prevent transformation into skin cancer - observe for new or changing spots and contact [facility] for appointment if occur - photoprotection with sun protective clothing; sunglasses and broad spectrum sunscreen with SPF of at least 30 + and frequent self skin exams recommended - yearly exams by a dermatologist recommended for persons with history of PreCancerous Actinic Keratoses  ACTINIC KERATOSIS (5) face x 5 (5) Actinic keratoses are precancerous spots that appear secondary to cumulative UV radiation exposure/sun exposure over time. They are chronic with expected duration over 1 year. A portion of actinic keratoses will progress to squamous cell carcinoma of the skin. It is not possible to reliably predict which spots will progress to skin cancer and so treatment is recommended to prevent development of skin cancer.  Recommend daily broad spectrum sunscreen SPF 30+ to sun-exposed areas, reapply every 2 hours as needed.  Recommend staying in the shade or wearing long sleeves, sun glasses (UVA+UVB protection) and wide brim hats (4-inch brim around the entire circumference of the hat). Call for new or changing lesions.  Destruction of lesion - face x 5 (5) Complexity: simple   Destruction method: cryotherapy   Informed consent: discussed and consent obtained   Timeout:  patient name, date of birth, surgical site, and procedure verified Lesion destroyed using liquid nitrogen: Yes   Region frozen until ice ball extended beyond lesion: Yes   Outcome: patient tolerated procedure well with no complications   Post-procedure details: wound care instructions given   INFLAMED  SEBORRHEIC KERATOSIS (17) face x 4, left inframammary x 10 , right inframammary x 3 (17) Symptomatic, irritating, patient would like treated. Destruction of lesion - face x 4, left inframammary x 10 , right inframammary x 3 (17) Complexity: simple   Destruction method: cryotherapy   Informed consent: discussed and consent obtained   Timeout:  patient name, date of birth, surgical site, and procedure verified Lesion destroyed using liquid nitrogen: Yes   Region frozen until ice ball extended beyond lesion: Yes   Outcome: patient tolerated procedure well with no complications   Post-procedure details: wound care instructions given   Return in about 1 year (around 04/11/2025) for TBSE.  IEleanor Blush, CMA, am acting as scribe for Alm Rhyme, MD.   Documentation: I have reviewed the above documentation for accuracy and completeness, and I agree with the above.  Alm Rhyme, MD

## 2024-04-26 ENCOUNTER — Ambulatory Visit: Admitting: Family Medicine

## 2024-05-31 ENCOUNTER — Ambulatory Visit (INDEPENDENT_AMBULATORY_CARE_PROVIDER_SITE_OTHER): Admitting: Family Medicine

## 2024-05-31 VITALS — BP 120/81 | HR 76 | Temp 98.1°F | Ht 65.0 in | Wt 172.8 lb

## 2024-05-31 DIAGNOSIS — E6609 Other obesity due to excess calories: Secondary | ICD-10-CM

## 2024-05-31 DIAGNOSIS — Z6830 Body mass index (BMI) 30.0-30.9, adult: Secondary | ICD-10-CM | POA: Diagnosis not present

## 2024-05-31 DIAGNOSIS — E66811 Obesity, class 1: Secondary | ICD-10-CM

## 2024-05-31 MED ORDER — TIRZEPATIDE-WEIGHT MANAGEMENT 5 MG/0.5ML ~~LOC~~ SOLN
5.0000 mg | SUBCUTANEOUS | 2 refills | Status: DC
Start: 2024-05-31 — End: 2024-08-22

## 2024-05-31 NOTE — Progress Notes (Signed)
 BP 120/81   Pulse 76   Temp 98.1 F (36.7 C) (Oral)   Ht 5' 5 (1.651 m)   Wt 172 lb 12.8 oz (78.4 kg)   SpO2 98%   BMI 28.76 kg/m    Subjective:    Patient ID: Mary Galvan, female    DOB: 17-Oct-1950, 73 y.o.   MRN: [id number]  HPI: Mary Galvan is a 73 y.o. female  Chief Complaint  Patient presents with   Obesity   OBESITY Duration: chronic Previous attempts at weight loss: yes, walking 0.5-1.5 miles a day and has been increasing her vegetables Complications of obesity: HTN, HLD, IFG, fatty liver disease Weight loss goal: 140s Weight loss to date: 12 lbs Requesting obesity pharmacotherapy: no Current weight loss supplements/medications: no Previous weight loss supplements/meds: yes- wellbutrin , zepbound   Relevant past medical, surgical, family and social history reviewed and updated as indicated. Interim medical history since our last visit reviewed. Allergies and medications reviewed and updated.  Review of Systems  Constitutional: Negative.   Respiratory: Negative.    Cardiovascular: Negative.   Gastrointestinal: Negative.   Psychiatric/Behavioral: Negative.      Per HPI unless specifically indicated above     Objective:    BP 120/81   Pulse 76   Temp 98.1 F (36.7 C) (Oral)   Ht 5' 5 (1.651 m)   Wt 172 lb 12.8 oz (78.4 kg)   SpO2 98%   BMI 28.76 kg/m   Wt Readings from Last 3 Encounters:  05/31/24 172 lb 12.8 oz (78.4 kg)  03/11/24 184 lb 12.8 oz (83.8 kg)  02/05/24 179 lb (81.2 kg)    Physical Exam Vitals and nursing note reviewed.  Constitutional:      General: She is not in acute distress.    Appearance: Normal appearance. She is not ill-appearing, toxic-appearing or diaphoretic.  HENT:     Head: Normocephalic and atraumatic.     Right Ear: External ear normal.     Left Ear: External ear normal.     Nose: Nose normal.     Mouth/Throat:     Mouth: Mucous membranes are moist.     Pharynx: Oropharynx is clear.  Eyes:      General: No scleral icterus.       Right eye: No discharge.        Left eye: No discharge.     Extraocular Movements: Extraocular movements intact.     Conjunctiva/sclera: Conjunctivae normal.     Pupils: Pupils are equal, round, and reactive to light.  Cardiovascular:     Rate and Rhythm: Normal rate and regular rhythm.     Pulses: Normal pulses.     Heart sounds: Normal heart sounds. No murmur heard.    No friction rub. No gallop.  Pulmonary:     Effort: Pulmonary effort is normal. No respiratory distress.     Breath sounds: Normal breath sounds. No stridor. No wheezing, rhonchi or rales.  Chest:     Chest wall: No tenderness.  Musculoskeletal:        General: Normal range of motion.     Cervical back: Normal range of motion and neck supple.  Skin:    General: Skin is warm and dry.     Capillary Refill: Capillary refill takes less than 2 seconds.     Coloration: Skin is not jaundiced or pale.     Findings: No bruising, erythema, lesion or rash.  Neurological:     General: No focal deficit  present.     Mental Status: She is alert and oriented to person, place, and time. Mental status is at baseline.  Psychiatric:        Mood and Affect: Mood normal.        Behavior: Behavior normal.        Thought Content: Thought content normal.        Judgment: Judgment normal.     Results for orders placed or performed in visit on 12/22/23  CBC with Differential/Platelet   Collection Time: 12/22/23 10:55 AM  Result Value Ref Range   WBC 6.1 3.4 - 10.8 x10E3/uL   RBC 4.99 3.77 - 5.28 x10E6/uL   Hemoglobin 15.6 11.1 - 15.9 g/dL   Hematocrit 52.7 (H) 65.9 - 46.6 %   MCV 95 79 - 97 fL   MCH 31.3 26.6 - 33.0 pg   MCHC 33.1 31.5 - 35.7 g/dL   RDW 87.3 88.2 - 84.5 %   Platelets 230 150 - 450 x10E3/uL   Neutrophils 59 Not Estab. %   Lymphs 31 Not Estab. %   Monocytes 8 Not Estab. %   Eos 1 Not Estab. %   Basos 1 Not Estab. %   Neutrophils Absolute 3.6 1.4 - 7.0 x10E3/uL   Lymphocytes  Absolute 1.9 0.7 - 3.1 x10E3/uL   Monocytes Absolute 0.5 0.1 - 0.9 x10E3/uL   EOS (ABSOLUTE) 0.1 0.0 - 0.4 x10E3/uL   Basophils Absolute 0.0 0.0 - 0.2 x10E3/uL   Immature Granulocytes 0 Not Estab. %   Immature Grans (Abs) 0.0 0.0 - 0.1 x10E3/uL  Comprehensive metabolic panel   Collection Time: 12/22/23 10:55 AM  Result Value Ref Range   Glucose 104 (H) 70 - 99 mg/dL   BUN 18 8 - 27 mg/dL   Creatinine, Ser 9.35 0.57 - 1.00 mg/dL   eGFR 93 >40 fO/fpw/8.26   BUN/Creatinine Ratio 28 12 - 28   Sodium 139 134 - 144 mmol/L   Potassium 4.1 3.5 - 5.2 mmol/L   Chloride 100 96 - 106 mmol/L   CO2 28 20 - 29 mmol/L   Calcium 9.6 8.7 - 10.3 mg/dL   Total Protein 6.8 6.0 - 8.5 g/dL   Albumin 4.3 3.8 - 4.8 g/dL   Globulin, Total 2.5 1.5 - 4.5 g/dL   Bilirubin Total 0.4 0.0 - 1.2 mg/dL   Alkaline Phosphatase 84 44 - 121 IU/L   AST 15 0 - 40 IU/L   ALT 17 0 - 32 IU/L  Lipid Panel w/o Chol/HDL Ratio   Collection Time: 12/22/23 10:55 AM  Result Value Ref Range   Cholesterol, Total 255 (H) 100 - 199 mg/dL   Triglycerides 750 (H) 0 - 149 mg/dL   HDL 49 >60 mg/dL   VLDL Cholesterol Cal 46 (H) 5 - 40 mg/dL   LDL Chol Calc (NIH) 839 (H) 0 - 99 mg/dL  TSH   Collection Time: 12/22/23 10:55 AM  Result Value Ref Range   TSH 1.690 0.450 - 4.500 uIU/mL  VITAMIN D  25 Hydroxy (Vit-D Deficiency, Fractures)   Collection Time: 12/22/23 10:55 AM  Result Value Ref Range   Vit D, 25-Hydroxy 34.5 30.0 - 100.0 ng/mL  Microalbumin, Urine Waived   Collection Time: 12/22/23 10:57 AM  Result Value Ref Range   Microalb, Ur Waived 30 (H) 0 - 19 mg/L   Creatinine, Urine Waived 100 10 - 300 mg/dL   Microalb/Creat Ratio <30 <30 mg/g      Assessment & Plan:   Problem List  Items Addressed This Visit       Other   Obesity - Primary   Tolerating her medicine well. Congratulated patient on 12lb weight loss for 6.5% weight loss. Will increase her dose to 5mg  weekly and recheck in 3 months.       Relevant  Medications   tirzepatide  5 MG/0.5ML injection vial     Follow up plan: Return in about 3 months (around 08/31/2024).

## 2024-05-31 NOTE — Assessment & Plan Note (Signed)
 Tolerating her medicine well. Congratulated patient on 12lb weight loss for 6.5% weight loss. Will increase her dose to 5mg  weekly and recheck in 3 months.

## 2024-08-02 ENCOUNTER — Other Ambulatory Visit: Payer: Self-pay | Admitting: Family Medicine

## 2024-08-02 MED ORDER — CHLORTHALIDONE 25 MG PO TABS: 25.0000 mg | ORAL_TABLET | Freq: Every day | ORAL | 0 refills | Status: DC

## 2024-08-02 NOTE — Telephone Encounter (Signed)
 Prescription Request  08/02/2024  LOV: 05/31/2024  What is the name of the medication?  chlorthalidone  (HYGROTON ) 25 MG tablet   Have you contacted your pharmacy to request a refill? No   Which pharmacy would you like this sent to?    [facility] - [place], [place] - [address] [place] [address] Phone: [phone] Fax: [phone]  Patient notified that their request is being sent to the clinical staff for review and that they should receive a response within 2 business days.   Please advise at Mobile [phone] (mobile)

## 2024-08-02 NOTE — Telephone Encounter (Signed)
Routing to provider. Medication is due for refill.

## 2024-08-22 ENCOUNTER — Other Ambulatory Visit: Payer: Self-pay | Admitting: Family Medicine

## 2024-08-22 ENCOUNTER — Telehealth: Payer: Self-pay | Admitting: Family Medicine

## 2024-08-22 MED ORDER — TIRZEPATIDE-WEIGHT MANAGEMENT 5 MG/0.5ML ~~LOC~~ SOLN: 5.0000 mg | SUBCUTANEOUS | 0 refills | Status: DC

## 2024-08-22 NOTE — Telephone Encounter (Signed)
Routing to provider. Medication is due for refill.

## 2024-08-22 NOTE — Telephone Encounter (Signed)
 Copied from CRM #[id number]. Topic: Clinical - Prescription Issue >> Aug 22, 2024 11:18 AM Rosaria BRAVO wrote: Reason for CRM: pt called about her zepbound , says her refill is due now and she is out.   ----------------------------------------------------------------------- From previous Reason for Contact - Cancel/Reschedule: Patient/patient representative is calling to cancel or reschedule an appointment. Refer to attachments for appointment information.

## 2024-08-29 ENCOUNTER — Ambulatory Visit (INDEPENDENT_AMBULATORY_CARE_PROVIDER_SITE_OTHER): Admitting: Family Medicine

## 2024-08-29 ENCOUNTER — Encounter: Payer: Self-pay | Admitting: Family Medicine

## 2024-08-29 VITALS — BP 127/80 | HR 66 | Temp 97.7°F | Ht 65.0 in | Wt 159.8 lb

## 2024-08-29 DIAGNOSIS — E66811 Obesity, class 1: Secondary | ICD-10-CM | POA: Diagnosis not present

## 2024-08-29 DIAGNOSIS — E782 Mixed hyperlipidemia: Secondary | ICD-10-CM | POA: Diagnosis not present

## 2024-08-29 DIAGNOSIS — Z23 Encounter for immunization: Secondary | ICD-10-CM | POA: Diagnosis not present

## 2024-08-29 DIAGNOSIS — F3341 Major depressive disorder, recurrent, in partial remission: Secondary | ICD-10-CM | POA: Diagnosis not present

## 2024-08-29 DIAGNOSIS — E6609 Other obesity due to excess calories: Secondary | ICD-10-CM

## 2024-08-29 DIAGNOSIS — I10 Essential (primary) hypertension: Secondary | ICD-10-CM

## 2024-08-29 DIAGNOSIS — Z6830 Body mass index (BMI) 30.0-30.9, adult: Secondary | ICD-10-CM

## 2024-08-29 LAB — MICROALBUMIN, URINE WAIVED
Creatinine, Urine Waived: 50 mg/dL (ref 10–300)
Microalb, Ur Waived: 10 mg/L (ref 0–19)
Microalb/Creat Ratio: <30 mg/g (ref <30)

## 2024-08-29 LAB — BAYER DCA HB A1C WAIVED: HB A1C (BAYER DCA - WAIVED): 5.5 % (ref 4.8–5.6)

## 2024-08-29 MED ORDER — TIRZEPATIDE-WEIGHT MANAGEMENT 5 MG/0.5ML ~~LOC~~ SOLN: 5.0000 mg | SUBCUTANEOUS | 2 refills | Status: AC

## 2024-08-29 MED ORDER — CHLORTHALIDONE 25 MG PO TABS: 25.0000 mg | ORAL_TABLET | Freq: Every day | ORAL | 0 refills | Status: AC

## 2024-08-29 NOTE — Assessment & Plan Note (Signed)
 Under good control on current regimen. Continue current regimen. Continue to monitor. Call with any concerns. Refills given. Labs drawn today.

## 2024-08-29 NOTE — Assessment & Plan Note (Addendum)
 Tolerating her zepbound  well. Down 25lbs for 13.9% of her body weight. Continue current regimen. Continue to monitor. Call with any concerns.

## 2024-08-29 NOTE — Assessment & Plan Note (Signed)
 Rechecking labs today. Await results. Treat as needed.

## 2024-08-29 NOTE — Assessment & Plan Note (Signed)
Doing well off medicine. Continue current regimen. Continue to monitor. Call with any concerns.  

## 2024-08-29 NOTE — Progress Notes (Signed)
 BP 127/80   Pulse 66   Temp 97.7 F (36.5 C) (Oral)   Ht 5' 5 (1.651 m)   Wt 159 lb 12.8 oz (72.5 kg)   SpO2 98%   BMI 26.59 kg/m    Subjective:    Patient ID: Mary Galvan, female    DOB: 08/24/1951, 73 y.o.   MRN: [id number]  HPI: Mary Galvan is a 73 y.o. female  Chief Complaint  Patient presents with   Weight Check   Hypertension   Depression   OBESITY Duration: chronic Previous attempts at weight loss: yes, walking 0.5-1.5 miles a day and has been increasing her vegetables Complications of obesity: HTN, HLD, IFG, fatty liver disease Peak Weight: 184lbs Weight loss goal: 140s Weight loss to date: 25lbs Requesting obesity pharmacotherapy: no Current weight loss supplements/medications: no Previous weight loss supplements/meds: yes- wellbutrin , zepbound   HYPERTENSION  Hypertension status: controlled  Satisfied with current treatment? yes Duration of hypertension: chronic BP monitoring frequency:  not checking BP medication side effects:  no Medication compliance: excellent compliance Previous BP meds: chlorthalidone  Aspirin: no Recurrent headaches: no Visual changes: no Palpitations: no Dyspnea: no Chest pain: no Lower extremity edema: no Dizzy/lightheaded: no  DEPRESSION Mood status: controlled Satisfied with current treatment?: yes Symptom severity: mild  Duration of current treatment : Not on anything Psychotherapy/counseling:  no Previous psychiatric medications: wellbutrin  Depressed mood: no Anxious mood: yes Anhedonia: no Significant weight loss or gain: no Insomnia: no  Fatigue: no Feelings of worthlessness or guilt: no Impaired concentration/indecisiveness: no Suicidal ideations: no Hopelessness: no Crying spells: no    08/29/2024   10:12 AM 05/31/2024    2:33 PM 01/21/2024    3:35 PM 12/24/2023    3:27 PM 12/22/2023   10:46 AM  Depression screen PHQ 2/9  Decreased Interest 0 0 0 1 0  Down, Depressed, Hopeless 0 0 0 1 0   PHQ - 2 Score 0 0 0 2 0  Altered sleeping 0 0 0 0 0  Tired, decreased energy 0 0 0 0 1  Change in appetite 0 0 0 0 0  Feeling bad or failure about yourself  0 0 0 0 0  Trouble concentrating 0 0 0 0 0  Moving slowly or fidgety/restless 0 0 0 0 0  Suicidal thoughts 0 0 0 0 0  PHQ-9 Score 0 0  0  2  1   Difficult doing work/chores Not difficult at all   Not difficult at all      Data saved with a previous flowsheet row definition    Relevant past medical, surgical, family and social history reviewed and updated as indicated. Interim medical history since our last visit reviewed. Allergies and medications reviewed and updated.  Review of Systems  Constitutional: Negative.   Respiratory: Negative.    Cardiovascular: Negative.   Musculoskeletal: Negative.   Neurological: Negative.   Psychiatric/Behavioral: Negative.      Per HPI unless specifically indicated above     Objective:    BP 127/80   Pulse 66   Temp 97.7 F (36.5 C) (Oral)   Ht 5' 5 (1.651 m)   Wt 159 lb 12.8 oz (72.5 kg)   SpO2 98%   BMI 26.59 kg/m   Wt Readings from Last 3 Encounters:  08/29/24 159 lb 12.8 oz (72.5 kg)  05/31/24 172 lb 12.8 oz (78.4 kg)  03/11/24 184 lb 12.8 oz (83.8 kg)    Physical Exam Vitals and nursing note reviewed.  Constitutional:  General: She is not in acute distress.    Appearance: Normal appearance. She is not ill-appearing, toxic-appearing or diaphoretic.  HENT:     Head: Normocephalic and atraumatic.     Right Ear: External ear normal.     Left Ear: External ear normal.     Nose: Nose normal.     Mouth/Throat:     Mouth: Mucous membranes are moist.     Pharynx: Oropharynx is clear.  Eyes:     General: No scleral icterus.       Right eye: No discharge.        Left eye: No discharge.     Extraocular Movements: Extraocular movements intact.     Conjunctiva/sclera: Conjunctivae normal.     Pupils: Pupils are equal, round, and reactive to light.  Cardiovascular:      Rate and Rhythm: Normal rate and regular rhythm.     Pulses: Normal pulses.     Heart sounds: Normal heart sounds. No murmur heard.    No friction rub. No gallop.  Pulmonary:     Effort: Pulmonary effort is normal. No respiratory distress.     Breath sounds: Normal breath sounds. No stridor. No wheezing, rhonchi or rales.  Chest:     Chest wall: No tenderness.  Musculoskeletal:        General: Normal range of motion.     Cervical back: Normal range of motion and neck supple.  Skin:    General: Skin is warm and dry.     Capillary Refill: Capillary refill takes less than 2 seconds.     Coloration: Skin is not jaundiced or pale.     Findings: No bruising, erythema, lesion or rash.  Neurological:     General: No focal deficit present.     Mental Status: She is alert and oriented to person, place, and time. Mental status is at baseline.  Psychiatric:        Mood and Affect: Mood normal.        Behavior: Behavior normal.        Thought Content: Thought content normal.        Judgment: Judgment normal.     Results for orders placed or performed in visit on 12/22/23  CBC with Differential/Platelet   Collection Time: 12/22/23 10:55 AM  Result Value Ref Range   WBC 6.1 3.4 - 10.8 x10E3/uL   RBC 4.99 3.77 - 5.28 x10E6/uL   Hemoglobin 15.6 11.1 - 15.9 g/dL   Hematocrit 52.7 (H) 65.9 - 46.6 %   MCV 95 79 - 97 fL   MCH 31.3 26.6 - 33.0 pg   MCHC 33.1 31.5 - 35.7 g/dL   RDW 87.3 88.2 - 84.5 %   Platelets 230 150 - 450 x10E3/uL   Neutrophils 59 Not Estab. %   Lymphs 31 Not Estab. %   Monocytes 8 Not Estab. %   Eos 1 Not Estab. %   Basos 1 Not Estab. %   Neutrophils Absolute 3.6 1.4 - 7.0 x10E3/uL   Lymphocytes Absolute 1.9 0.7 - 3.1 x10E3/uL   Monocytes Absolute 0.5 0.1 - 0.9 x10E3/uL   EOS (ABSOLUTE) 0.1 0.0 - 0.4 x10E3/uL   Basophils Absolute 0.0 0.0 - 0.2 x10E3/uL   Immature Granulocytes 0 Not Estab. %   Immature Grans (Abs) 0.0 0.0 - 0.1 x10E3/uL  Comprehensive metabolic  panel   Collection Time: 12/22/23 10:55 AM  Result Value Ref Range   Glucose 104 (H) 70 - 99 mg/dL   BUN  18 8 - 27 mg/dL   Creatinine, Ser 9.35 0.57 - 1.00 mg/dL   eGFR 93 >40 fO/fpw/8.26   BUN/Creatinine Ratio 28 12 - 28   Sodium 139 134 - 144 mmol/L   Potassium 4.1 3.5 - 5.2 mmol/L   Chloride 100 96 - 106 mmol/L   CO2 28 20 - 29 mmol/L   Calcium 9.6 8.7 - 10.3 mg/dL   Total Protein 6.8 6.0 - 8.5 g/dL   Albumin 4.3 3.8 - 4.8 g/dL   Globulin, Total 2.5 1.5 - 4.5 g/dL   Bilirubin Total 0.4 0.0 - 1.2 mg/dL   Alkaline Phosphatase 84 44 - 121 IU/L   AST 15 0 - 40 IU/L   ALT 17 0 - 32 IU/L  Lipid Panel w/o Chol/HDL Ratio   Collection Time: 12/22/23 10:55 AM  Result Value Ref Range   Cholesterol, Total 255 (H) 100 - 199 mg/dL   Triglycerides 750 (H) 0 - 149 mg/dL   HDL 49 >60 mg/dL   VLDL Cholesterol Cal 46 (H) 5 - 40 mg/dL   LDL Chol Calc (NIH) 839 (H) 0 - 99 mg/dL  TSH   Collection Time: 12/22/23 10:55 AM  Result Value Ref Range   TSH 1.690 0.450 - 4.500 uIU/mL  VITAMIN D  25 Hydroxy (Vit-D Deficiency, Fractures)   Collection Time: 12/22/23 10:55 AM  Result Value Ref Range   Vit D, 25-Hydroxy 34.5 30.0 - 100.0 ng/mL  Microalbumin, Urine Waived   Collection Time: 12/22/23 10:57 AM  Result Value Ref Range   Microalb, Ur Waived 30 (H) 0 - 19 mg/L   Creatinine, Urine Waived 100 10 - 300 mg/dL   Microalb/Creat Ratio <30 <30 mg/g      Assessment & Plan:   Problem List Items Addressed This Visit       Cardiovascular and Mediastinum   HTN (hypertension)   Under good control on current regimen. Continue current regimen. Continue to monitor. Call with any concerns. Refills given. Labs drawn today.        Relevant Medications   chlorthalidone  (HYGROTON ) 25 MG tablet   Other Relevant Orders   CBC with Differential/Platelet   Comprehensive metabolic panel with GFR   Lipid Panel w/o Chol/HDL Ratio   Microalbumin, Urine Waived     Other   Hyperlipidemia   Rechecking labs  today. Await results. Treat as needed.       Relevant Medications   chlorthalidone  (HYGROTON ) 25 MG tablet   Other Relevant Orders   Comprehensive metabolic panel with GFR   Lipid Panel w/o Chol/HDL Ratio   Depression   Doing well off medicine. Continue current regimen. Continue to monitor. Call with any concerns.       Relevant Orders   CBC with Differential/Platelet   Comprehensive metabolic panel with GFR   Obesity - Primary   Tolerating her zepbound  well. Down 25lbs for 13.9% of her body weight. Continue current regimen. Continue to monitor. Call with any concerns.       Relevant Medications   tirzepatide  5 MG/0.5ML injection vial   Other Relevant Orders   CBC with Differential/Platelet   Comprehensive metabolic panel with GFR   Bayer DCA Hb A1c Waived   Other Visit Diagnoses       Needs flu shot       Flu shot given today.   Relevant Orders   Flu vaccine HIGH DOSE PF(Fluzone Trivalent)        Follow up plan: Return in about 3 months (around 11/29/2024).

## 2024-08-30 LAB — LIPID PANEL W/O CHOL/HDL RATIO
Cholesterol, Total: 213 mg/dL — ABNORMAL HIGH (ref 100–199)
HDL: 53 mg/dL (ref >39)
LDL Chol Calc (NIH): 139 mg/dL — ABNORMAL HIGH (ref 0–99)
Triglycerides: 117 mg/dL (ref 0–149)
VLDL Cholesterol Cal: 21 mg/dL (ref 5–40)

## 2024-08-30 LAB — COMPREHENSIVE METABOLIC PANEL WITH GFR
ALT: 18 IU/L (ref 0–32)
AST: 22 IU/L (ref 0–40)
Albumin: 4.3 g/dL (ref 3.8–4.8)
Alkaline Phosphatase: 63 IU/L (ref 49–135)
BUN/Creatinine Ratio: 32 — ABNORMAL HIGH (ref 12–28)
BUN: 16 mg/dL (ref 8–27)
Bilirubin Total: 0.3 mg/dL (ref 0.0–1.2)
CO2: 27 mmol/L (ref 20–29)
Calcium: 10 mg/dL (ref 8.7–10.3)
Chloride: 101 mmol/L (ref 96–106)
Creatinine, Ser: 0.5 mg/dL — ABNORMAL LOW (ref 0.57–1.00)
Globulin, Total: 2.6 g/dL (ref 1.5–4.5)
Glucose: 84 mg/dL (ref 70–99)
Potassium: 3.9 mmol/L (ref 3.5–5.2)
Sodium: 141 mmol/L (ref 134–144)
Total Protein: 6.9 g/dL (ref 6.0–8.5)
eGFR: 98 mL/min/1.73 (ref >59)

## 2024-08-30 LAB — CBC WITH DIFFERENTIAL/PLATELET
Basophils Absolute: 0 x10E3/uL (ref 0.0–0.2)
Basos: 0 %
EOS (ABSOLUTE): 0.1 x10E3/uL (ref 0.0–0.4)
Eos: 1 %
Hematocrit: 45.4 % (ref 34.0–46.6)
Hemoglobin: 15 g/dL (ref 11.1–15.9)
Immature Grans (Abs): 0 x10E3/uL (ref 0.0–0.1)
Immature Granulocytes: 0 %
Lymphocytes Absolute: 2.2 x10E3/uL (ref 0.7–3.1)
Lymphs: 32 %
MCH: 31.6 pg (ref 26.6–33.0)
MCHC: 33 g/dL (ref 31.5–35.7)
MCV: 96 fL (ref 79–97)
Monocytes Absolute: 0.5 x10E3/uL (ref 0.1–0.9)
Monocytes: 7 %
Neutrophils Absolute: 4.2 x10E3/uL (ref 1.4–7.0)
Neutrophils: 60 %
Platelets: 272 x10E3/uL (ref 150–450)
RBC: 4.75 x10E6/uL (ref 3.77–5.28)
RDW: 12.7 % (ref 11.7–15.4)
WBC: 7 x10E3/uL (ref 3.4–10.8)

## 2024-09-02 ENCOUNTER — Ambulatory Visit: Admitting: Family Medicine

## 2024-09-06 ENCOUNTER — Ambulatory Visit: Payer: Self-pay | Admitting: Family Medicine

## 2024-11-03 ENCOUNTER — Other Ambulatory Visit: Payer: Self-pay | Admitting: Family Medicine

## 2024-11-04 NOTE — Telephone Encounter (Signed)
 Rx 08/29/24 #90- too soon Requested Prescriptions  Pending Prescriptions Disp Refills   chlorthalidone  (HYGROTON ) 25 MG tablet [Pharmacy Med Name: CHLORTHALIDONE  25 MG TAB] 90 tablet 0    Sig: TAKE ONE TABLET BY MOUTH ONCE DAILY     Cardiovascular: Diuretics - Thiazide Failed - 11/04/2024 10:11 AM      Failed - Cr in normal range and within 180 days    Creatinine, Ser  Date Value Ref Range Status  08/29/2024 0.50 (L) 0.57 - 1.00 mg/dL Final         Passed - K in normal range and within 180 days    Potassium  Date Value Ref Range Status  08/29/2024 3.9 3.5 - 5.2 mmol/L Final         Passed - Na in normal range and within 180 days    Sodium  Date Value Ref Range Status  08/29/2024 141 134 - 144 mmol/L Final         Passed - Last BP in normal range    BP Readings from Last 1 Encounters:  08/29/24 127/80         Passed - Valid encounter within last 6 months    Recent Outpatient Visits           2 months ago Class 1 obesity due to excess calories with serious comorbidity and body mass index (BMI) of 30.0 to 30.9 in adult   [facility], Megan P, DO   5 months ago Class 1 obesity due to excess calories with serious comorbidity and body mass index (BMI) of 30.0 to 30.9 in adult   [facility], Megan P, DO   7 months ago Class 1 obesity due to excess calories with serious comorbidity and body mass index (BMI) of 30.0 to 30.9 in adult   [facility] [place], Megan P, DO   9 months ago Acute non-recurrent maxillary sinusitis   [facility] [place], [place], DO   9 months ago Primary hypertension    [facility] [place], [place], DO       Future Appointments             In 5 months Hester Alm BROCKS, MD [facility]

## 2024-11-29 ENCOUNTER — Ambulatory Visit: Admitting: Family Medicine

## 2025-01-05 ENCOUNTER — Ambulatory Visit

## 2025-04-11 ENCOUNTER — Ambulatory Visit: Admitting: Dermatology
